# Patient Record
Sex: Male | Born: 1940 | Race: Black or African American | Hispanic: No | Smoking: Former smoker
Health system: Southern US, Community
[De-identification: ages and names within clinical notes are randomized; demographics above are authoritative.]

## PROBLEM LIST (undated history)

## (undated) DIAGNOSIS — E785 Hyperlipidemia, unspecified: Secondary | ICD-10-CM

## (undated) DIAGNOSIS — I10 Essential (primary) hypertension: Secondary | ICD-10-CM

## (undated) DIAGNOSIS — I509 Heart failure, unspecified: Secondary | ICD-10-CM

## (undated) HISTORY — DX: Heart failure, unspecified: I50.9

## (undated) HISTORY — DX: Essential (primary) hypertension: I10

## (undated) HISTORY — DX: Hyperlipidemia, unspecified: E78.5

---

## 2001-03-19 ENCOUNTER — Emergency Department (HOSPITAL_COMMUNITY): Admission: EM | Admit: 2001-03-19 | Discharge: 2001-03-19 | Payer: Self-pay | Admitting: Emergency Medicine

## 2002-10-29 ENCOUNTER — Emergency Department (HOSPITAL_COMMUNITY): Admission: EM | Admit: 2002-10-29 | Discharge: 2002-10-29 | Payer: Self-pay | Admitting: Emergency Medicine

## 2004-06-09 ENCOUNTER — Emergency Department (HOSPITAL_COMMUNITY): Admission: EM | Admit: 2004-06-09 | Discharge: 2004-06-09 | Payer: Self-pay | Admitting: Emergency Medicine

## 2007-10-04 ENCOUNTER — Emergency Department (HOSPITAL_COMMUNITY): Admission: EM | Admit: 2007-10-04 | Discharge: 2007-10-04 | Payer: Self-pay | Admitting: Family Medicine

## 2007-12-28 ENCOUNTER — Emergency Department (HOSPITAL_COMMUNITY): Admission: EM | Admit: 2007-12-28 | Discharge: 2007-12-28 | Payer: Self-pay | Admitting: Emergency Medicine

## 2009-12-22 HISTORY — PX: CARDIAC CATHETERIZATION: SHX172

## 2010-07-11 ENCOUNTER — Ambulatory Visit: Payer: Self-pay | Admitting: Cardiology

## 2010-07-11 ENCOUNTER — Inpatient Hospital Stay (HOSPITAL_COMMUNITY): Admission: EM | Admit: 2010-07-11 | Discharge: 2010-07-16 | Payer: Self-pay | Admitting: Emergency Medicine

## 2010-07-11 ENCOUNTER — Emergency Department (HOSPITAL_COMMUNITY): Admission: EM | Admit: 2010-07-11 | Discharge: 2010-07-11 | Payer: Self-pay | Admitting: Emergency Medicine

## 2010-07-12 ENCOUNTER — Encounter (INDEPENDENT_AMBULATORY_CARE_PROVIDER_SITE_OTHER): Payer: Self-pay | Admitting: Internal Medicine

## 2010-08-05 ENCOUNTER — Ambulatory Visit: Payer: Self-pay | Admitting: Cardiovascular Disease

## 2010-11-06 ENCOUNTER — Ambulatory Visit: Payer: Self-pay | Admitting: Cardiovascular Disease

## 2011-03-08 LAB — POCT URINALYSIS DIP (DEVICE)
Nitrite: NEGATIVE
Specific Gravity, Urine: 1.015 (ref 1.005–1.030)
Urobilinogen, UA: 1 mg/dL (ref 0.0–1.0)

## 2011-03-08 LAB — CARDIAC PANEL(CRET KIN+CKTOT+MB+TROPI)
CK, MB: 2.2 ng/mL (ref 0.3–4.0)
Relative Index: 1.6 (ref 0.0–2.5)
Total CK: 137 U/L (ref 7–232)
Total CK: 156 U/L (ref 7–232)
Troponin I: 0.02 ng/mL (ref 0.00–0.06)

## 2011-03-08 LAB — BASIC METABOLIC PANEL
BUN: 15 mg/dL (ref 6–23)
BUN: 15 mg/dL (ref 6–23)
BUN: 17 mg/dL (ref 6–23)
CO2: 33 mEq/L — ABNORMAL HIGH (ref 19–32)
CO2: 35 mEq/L — ABNORMAL HIGH (ref 19–32)
Calcium: 10.1 mg/dL (ref 8.4–10.5)
Chloride: 94 mEq/L — ABNORMAL LOW (ref 96–112)
Creatinine, Ser: 0.99 mg/dL (ref 0.4–1.5)
GFR calc Af Amer: >60 mL/min (ref >60)
GFR calc Af Amer: >60 mL/min (ref >60)
GFR calc non Af Amer: >60 mL/min (ref >60)
GFR calc non Af Amer: >60 mL/min (ref >60)
GFR calc non Af Amer: >60 mL/min (ref >60)
Glucose, Bld: 100 mg/dL — ABNORMAL HIGH (ref 70–99)
Glucose, Bld: 93 mg/dL (ref 70–99)
Potassium: 3.9 mEq/L (ref 3.5–5.1)
Potassium: 3.9 mEq/L (ref 3.5–5.1)
Sodium: 135 mEq/L (ref 135–145)
Sodium: 141 mEq/L (ref 135–145)

## 2011-03-08 LAB — LIPID PANEL
Cholesterol: 173 mg/dL (ref 0–200)
HDL: 50 mg/dL (ref >39)
VLDL: 13 mg/dL (ref 0–40)

## 2011-03-08 LAB — CBC
HCT: 42.6 % (ref 39.0–52.0)
MCH: 31.7 pg (ref 26.0–34.0)
MCH: 31.9 pg (ref 26.0–34.0)
MCH: 31.9 pg (ref 26.0–34.0)
MCHC: 33.6 g/dL (ref 30.0–36.0)
MCHC: 33.8 g/dL (ref 30.0–36.0)
MCV: 93.9 fL (ref 78.0–100.0)
MCV: 94.2 fL (ref 78.0–100.0)
MCV: 94.4 fL (ref 78.0–100.0)
Platelets: 159 10*3/uL (ref 150–400)
Platelets: 176 10*3/uL (ref 150–400)
RBC: 4.51 MIL/uL (ref 4.22–5.81)
RDW: 12.8 % (ref 11.5–15.5)
RDW: 13.3 % (ref 11.5–15.5)
RDW: 13.4 % (ref 11.5–15.5)
RDW: 13.5 % (ref 11.5–15.5)
WBC: 3.9 10*3/uL — ABNORMAL LOW (ref 4.0–10.5)
WBC: 4.7 10*3/uL (ref 4.0–10.5)
WBC: 5.3 10*3/uL (ref 4.0–10.5)

## 2011-03-08 LAB — COMPREHENSIVE METABOLIC PANEL
ALT: 19 U/L (ref 0–53)
AST: 22 U/L (ref 0–37)
Albumin: 4 g/dL (ref 3.5–5.2)
Alkaline Phosphatase: 41 U/L (ref 39–117)
CO2: 30 mEq/L (ref 19–32)
Calcium: 9.5 mg/dL (ref 8.4–10.5)
Creatinine, Ser: 0.98 mg/dL (ref 0.4–1.5)
Glucose, Bld: 100 mg/dL — ABNORMAL HIGH (ref 70–99)
Total Protein: 7.3 g/dL (ref 6.0–8.3)

## 2011-03-08 LAB — PROTIME-INR
INR: 1.02 (ref 0.00–1.49)
Prothrombin Time: 13.3 seconds (ref 11.6–15.2)

## 2011-03-08 LAB — URINALYSIS, ROUTINE W REFLEX MICROSCOPIC
Bilirubin Urine: NEGATIVE
Nitrite: NEGATIVE
Specific Gravity, Urine: 1.015 (ref 1.005–1.030)
Urobilinogen, UA: 1 mg/dL (ref 0.0–1.0)

## 2011-03-08 LAB — POCT I-STAT, CHEM 8: Creatinine, Ser: 1 mg/dL (ref 0.4–1.5)

## 2011-03-08 LAB — CK TOTAL AND CKMB (NOT AT ARMC)
Relative Index: 1.9 (ref 0.0–2.5)
Total CK: 151 U/L (ref 7–232)
Total CK: 195 U/L (ref 7–232)

## 2011-03-08 LAB — GLUCOSE, CAPILLARY
Glucose-Capillary: 108 mg/dL — ABNORMAL HIGH (ref 70–99)
Glucose-Capillary: 110 mg/dL — ABNORMAL HIGH (ref 70–99)

## 2011-03-08 LAB — BRAIN NATRIURETIC PEPTIDE: Pro B Natriuretic peptide (BNP): 1228 pg/mL — ABNORMAL HIGH (ref 0.0–100.0)

## 2011-03-08 LAB — TSH: TSH: 1.686 u[IU]/mL (ref 0.350–4.500)

## 2011-10-04 ENCOUNTER — Other Ambulatory Visit: Payer: Self-pay | Admitting: Cardiovascular Disease

## 2011-11-05 ENCOUNTER — Other Ambulatory Visit: Payer: Self-pay | Admitting: Cardiovascular Disease

## 2011-11-05 NOTE — Telephone Encounter (Signed)
Pt needs appointment then refill can be made, Fax Received. Refill Completed. Trevor Rodriguez (M.A)

## 2011-11-14 ENCOUNTER — Other Ambulatory Visit: Payer: Self-pay | Admitting: Cardiovascular Disease

## 2012-01-30 ENCOUNTER — Other Ambulatory Visit (HOSPITAL_COMMUNITY): Payer: Self-pay | Admitting: Gastroenterology

## 2012-01-30 DIAGNOSIS — Z139 Encounter for screening, unspecified: Secondary | ICD-10-CM

## 2012-02-16 ENCOUNTER — Ambulatory Visit (HOSPITAL_COMMUNITY)
Admission: RE | Admit: 2012-02-16 | Discharge: 2012-02-16 | Disposition: A | Discharge status: Home / Self Care | Payer: Medicare HMO | Source: Ambulatory Visit | Attending: Gastroenterology | Admitting: Gastroenterology

## 2012-02-16 DIAGNOSIS — Q438 Other specified congenital malformations of intestine: Secondary | ICD-10-CM | POA: Insufficient documentation

## 2012-02-16 DIAGNOSIS — Z139 Encounter for screening, unspecified: Secondary | ICD-10-CM

## 2012-06-04 ENCOUNTER — Encounter: Payer: Self-pay | Admitting: Cardiology

## 2013-02-15 ENCOUNTER — Emergency Department: Payer: Self-pay | Admitting: Emergency Medicine

## 2013-02-16 LAB — COMPREHENSIVE METABOLIC PANEL
Albumin: 2.8 g/dL — ABNORMAL LOW (ref 3.4–5.0)
Alkaline Phosphatase: 71 U/L (ref 50–136)
BUN: 17 mg/dL (ref 7–18)
Calcium, Total: 8.5 mg/dL (ref 8.5–10.1)
Co2: 23 mmol/L (ref 21–32)
EGFR (African American): >60
SGOT(AST): 23 U/L (ref 15–37)
SGPT (ALT): 22 U/L (ref 12–78)
Sodium: 136 mmol/L (ref 136–145)

## 2013-02-16 LAB — CBC
HCT: 34.3 % — ABNORMAL LOW (ref 40.0–52.0)
HGB: 11.4 g/dL — ABNORMAL LOW (ref 13.0–18.0)
MCV: 91 fL (ref 80–100)
Platelet: 165 10*3/uL (ref 150–440)

## 2013-02-20 ENCOUNTER — Emergency Department (HOSPITAL_COMMUNITY): Payer: Medicare HMO | Admitting: Anesthesiology

## 2013-02-20 ENCOUNTER — Encounter (HOSPITAL_COMMUNITY): Payer: Self-pay | Admitting: Anesthesiology

## 2013-02-20 ENCOUNTER — Emergency Department (HOSPITAL_COMMUNITY): Payer: Medicare HMO

## 2013-02-20 ENCOUNTER — Inpatient Hospital Stay (HOSPITAL_COMMUNITY)
Admission: EM | Admit: 2013-02-20 | Discharge: 2013-03-02 | DRG: 717 | Disposition: A | Discharge status: Home Health | Payer: Medicare HMO | Source: Ambulatory Visit | Attending: General Surgery | Admitting: General Surgery

## 2013-02-20 ENCOUNTER — Encounter (HOSPITAL_COMMUNITY): Admission: EM | Disposition: A | Discharge status: Home Health | Payer: Self-pay | Source: Ambulatory Visit

## 2013-02-20 ENCOUNTER — Encounter (HOSPITAL_COMMUNITY): Payer: Self-pay | Admitting: Emergency Medicine

## 2013-02-20 DIAGNOSIS — N493 Fournier gangrene: Secondary | ICD-10-CM | POA: Diagnosis present

## 2013-02-20 DIAGNOSIS — R739 Hyperglycemia, unspecified: Secondary | ICD-10-CM

## 2013-02-20 DIAGNOSIS — I509 Heart failure, unspecified: Secondary | ICD-10-CM | POA: Diagnosis present

## 2013-02-20 DIAGNOSIS — I10 Essential (primary) hypertension: Secondary | ICD-10-CM

## 2013-02-20 DIAGNOSIS — N400 Enlarged prostate without lower urinary tract symptoms: Secondary | ICD-10-CM | POA: Diagnosis present

## 2013-02-20 DIAGNOSIS — E119 Type 2 diabetes mellitus without complications: Secondary | ICD-10-CM | POA: Diagnosis present

## 2013-02-20 DIAGNOSIS — N501 Vascular disorders of male genital organs: Principal | ICD-10-CM | POA: Diagnosis present

## 2013-02-20 DIAGNOSIS — Z886 Allergy status to analgesic agent status: Secondary | ICD-10-CM

## 2013-02-20 DIAGNOSIS — I5022 Chronic systolic (congestive) heart failure: Secondary | ICD-10-CM | POA: Diagnosis present

## 2013-02-20 DIAGNOSIS — F17200 Nicotine dependence, unspecified, uncomplicated: Secondary | ICD-10-CM | POA: Diagnosis present

## 2013-02-20 DIAGNOSIS — E785 Hyperlipidemia, unspecified: Secondary | ICD-10-CM | POA: Diagnosis present

## 2013-02-20 DIAGNOSIS — I43 Cardiomyopathy in diseases classified elsewhere: Secondary | ICD-10-CM | POA: Diagnosis present

## 2013-02-20 DIAGNOSIS — I110 Hypertensive heart disease with heart failure: Secondary | ICD-10-CM | POA: Diagnosis present

## 2013-02-20 DIAGNOSIS — Z7982 Long term (current) use of aspirin: Secondary | ICD-10-CM

## 2013-02-20 DIAGNOSIS — Z79899 Other long term (current) drug therapy: Secondary | ICD-10-CM

## 2013-02-20 DIAGNOSIS — Z6841 Body Mass Index (BMI) 40.0 and over, adult: Secondary | ICD-10-CM

## 2013-02-20 HISTORY — PX: INCISION AND DRAINAGE ABSCESS: SHX5864

## 2013-02-20 LAB — BASIC METABOLIC PANEL
GFR calc Af Amer: 59 mL/min — ABNORMAL LOW (ref >90)
GFR calc non Af Amer: 51 mL/min — ABNORMAL LOW (ref >90)
Glucose, Bld: 203 mg/dL — ABNORMAL HIGH (ref 70–99)
Potassium: 3.3 mEq/L — ABNORMAL LOW (ref 3.5–5.1)
Sodium: 127 mEq/L — ABNORMAL LOW (ref 135–145)

## 2013-02-20 LAB — CBC WITH DIFFERENTIAL/PLATELET
Basophils Relative: 0 % (ref 0–1)
Eosinophils Absolute: 0 10*3/uL (ref 0.0–0.7)
Hemoglobin: 11.2 g/dL — ABNORMAL LOW (ref 13.0–17.0)
Lymphocytes Relative: 9 % — ABNORMAL LOW (ref 12–46)
MCHC: 35.6 g/dL (ref 30.0–36.0)
Neutrophils Relative %: 83 % — ABNORMAL HIGH (ref 43–77)
Platelets: 244 10*3/uL (ref 150–400)
RBC: 3.69 MIL/uL — ABNORMAL LOW (ref 4.22–5.81)

## 2013-02-20 LAB — TROPONIN I: Troponin I: <0.3 ng/mL (ref <0.30)

## 2013-02-20 LAB — CBC
HCT: 32.9 % — ABNORMAL LOW (ref 39.0–52.0)
Hemoglobin: 11.5 g/dL — ABNORMAL LOW (ref 13.0–17.0)
WBC: 17.4 10*3/uL — ABNORMAL HIGH (ref 4.0–10.5)

## 2013-02-20 LAB — CREATININE, SERUM
GFR calc Af Amer: 51 mL/min — ABNORMAL LOW (ref >90)
GFR calc non Af Amer: 44 mL/min — ABNORMAL LOW (ref >90)

## 2013-02-20 LAB — PROTIME-INR
INR: 1.15 (ref 0.00–1.49)
Prothrombin Time: 14.5 seconds (ref 11.6–15.2)

## 2013-02-20 LAB — MRSA PCR SCREENING: MRSA by PCR: NEGATIVE

## 2013-02-20 SURGERY — INCISION AND DRAINAGE, ABSCESS
Anesthesia: General | Site: Groin | Wound class: Dirty or Infected

## 2013-02-20 MED ORDER — SIMVASTATIN 40 MG PO TABS: 40.0000 mg | ORAL_TABLET | Freq: Every day | ORAL | Status: DC

## 2013-02-20 MED ORDER — SUCCINYLCHOLINE CHLORIDE 20 MG/ML IJ SOLN
INTRAMUSCULAR | Status: DC | PRN
  Administered 2013-02-20: 100 mg via INTRAVENOUS

## 2013-02-20 MED ORDER — PIPERACILLIN-TAZOBACTAM 3.375 G IVPB
3.3750 g | Freq: Three times a day (TID) | INTRAVENOUS | Status: DC
  Administered 2013-02-20 – 2013-02-28 (×24): 3.375 g via INTRAVENOUS
  Filled 2013-02-20 (×28): qty 50

## 2013-02-20 MED ORDER — HYDRALAZINE HCL 20 MG/ML IJ SOLN
10.0000 mg | Freq: Four times a day (QID) | INTRAMUSCULAR | Status: DC | PRN
  Filled 2013-02-20 (×2): qty 0.5

## 2013-02-20 MED ORDER — GLYCOPYRROLATE 0.2 MG/ML IJ SOLN
INTRAMUSCULAR | Status: DC | PRN
  Administered 2013-02-20: 0.4 mg via INTRAVENOUS

## 2013-02-20 MED ORDER — NEOSTIGMINE METHYLSULFATE 1 MG/ML IJ SOLN
INTRAMUSCULAR | Status: DC | PRN
  Administered 2013-02-20: 2 mg via INTRAVENOUS

## 2013-02-20 MED ORDER — LIDOCAINE HCL 4 % MT SOLN
OROMUCOSAL | Status: DC | PRN
  Administered 2013-02-20: 4 mL via TOPICAL

## 2013-02-20 MED ORDER — ONDANSETRON HCL 4 MG/2ML IJ SOLN: 4.0000 mg | Freq: Once | INTRAMUSCULAR | Status: DC | PRN

## 2013-02-20 MED ORDER — ENOXAPARIN SODIUM 30 MG/0.3ML ~~LOC~~ SOLN
40.0000 mg | SUBCUTANEOUS | Status: DC
  Administered 2013-02-21 – 2013-03-02 (×10): 40 mg via SUBCUTANEOUS
  Filled 2013-02-20 (×13): qty 0.4

## 2013-02-20 MED ORDER — POTASSIUM CHLORIDE IN NACL 20-0.45 MEQ/L-% IV SOLN
INTRAVENOUS | Status: DC
  Administered 2013-02-20 – 2013-02-21 (×2): via INTRAVENOUS
  Administered 2013-02-23: 1000 mL via INTRAVENOUS
  Administered 2013-02-23: 17:00:00 via INTRAVENOUS
  Administered 2013-02-26: 10 mL/h via INTRAVENOUS
  Filled 2013-02-20 (×8): qty 1000

## 2013-02-20 MED ORDER — SODIUM CHLORIDE 0.9 % IR SOLN
Status: DC | PRN
  Administered 2013-02-20: 3000 mL

## 2013-02-20 MED ORDER — LIDOCAINE HCL (CARDIAC) 20 MG/ML IV SOLN
INTRAVENOUS | Status: DC | PRN
  Administered 2013-02-20: 100 mg via INTRAVENOUS

## 2013-02-20 MED ORDER — SIMVASTATIN 40 MG PO TABS
40.0000 mg | ORAL_TABLET | Freq: Every day | ORAL | Status: DC
  Administered 2013-02-20 – 2013-03-01 (×10): 40 mg via ORAL
  Filled 2013-02-20 (×12): qty 1

## 2013-02-20 MED ORDER — INSULIN ASPART 100 UNIT/ML ~~LOC~~ SOLN: 0.0000 [IU] | Freq: Every day | SUBCUTANEOUS | Status: DC

## 2013-02-20 MED ORDER — ONDANSETRON HCL 4 MG/2ML IJ SOLN
4.0000 mg | Freq: Four times a day (QID) | INTRAMUSCULAR | Status: DC | PRN
  Filled 2013-02-20: qty 2

## 2013-02-20 MED ORDER — LACTATED RINGERS IV SOLN
INTRAVENOUS | Status: DC | PRN
  Administered 2013-02-20: 08:00:00 via INTRAVENOUS

## 2013-02-20 MED ORDER — ONDANSETRON HCL 4 MG/2ML IJ SOLN
INTRAMUSCULAR | Status: DC | PRN
  Administered 2013-02-20: 4 mg via INTRAVENOUS

## 2013-02-20 MED ORDER — INSULIN ASPART 100 UNIT/ML ~~LOC~~ SOLN
0.0000 [IU] | Freq: Three times a day (TID) | SUBCUTANEOUS | Status: DC
  Administered 2013-02-21 – 2013-02-22 (×2): 2 [IU] via SUBCUTANEOUS
  Administered 2013-02-22: 3 [IU] via SUBCUTANEOUS
  Administered 2013-02-23 – 2013-02-27 (×7): 2 [IU] via SUBCUTANEOUS

## 2013-02-20 MED ORDER — VECURONIUM BROMIDE 10 MG IV SOLR
INTRAVENOUS | Status: DC | PRN
  Administered 2013-02-20: 2 mg via INTRAVENOUS

## 2013-02-20 MED ORDER — PROPOFOL 10 MG/ML IV BOLUS
INTRAVENOUS | Status: DC | PRN
  Administered 2013-02-20: 100 mg via INTRAVENOUS

## 2013-02-20 MED ORDER — HYDROMORPHONE HCL PF 1 MG/ML IJ SOLN
1.0000 mg | INTRAMUSCULAR | Status: DC | PRN
  Administered 2013-02-21: 1 mg via INTRAVENOUS
  Filled 2013-02-20: qty 1

## 2013-02-20 MED ORDER — PHENYLEPHRINE HCL 10 MG/ML IJ SOLN
INTRAMUSCULAR | Status: DC | PRN
  Administered 2013-02-20: 80 ug via INTRAVENOUS
  Administered 2013-02-20 (×2): 120 ug via INTRAVENOUS
  Administered 2013-02-20: 80 ug via INTRAVENOUS

## 2013-02-20 MED ORDER — SODIUM CHLORIDE 0.9 % IV BOLUS (SEPSIS)
500.0000 mL | Freq: Once | INTRAVENOUS | Status: AC
  Administered 2013-02-20: 500 mL via INTRAVENOUS

## 2013-02-20 MED ORDER — CARVEDILOL 6.25 MG PO TABS: 6.2500 mg | ORAL_TABLET | Freq: Two times a day (BID) | ORAL | Status: DC

## 2013-02-20 MED ORDER — VANCOMYCIN HCL IN DEXTROSE 1-5 GM/200ML-% IV SOLN
1000.0000 mg | INTRAVENOUS | Status: DC
  Administered 2013-02-20 – 2013-02-21 (×2): 1000 mg via INTRAVENOUS
  Filled 2013-02-20 (×2): qty 200

## 2013-02-20 MED ORDER — PIPERACILLIN-TAZOBACTAM 3.375 G IVPB 30 MIN
3.3750 g | Freq: Once | INTRAVENOUS | Status: AC
  Administered 2013-02-20: 3.375 g via INTRAVENOUS
  Filled 2013-02-20: qty 50

## 2013-02-20 MED ORDER — TAMSULOSIN HCL 0.4 MG PO CAPS: 0.4000 mg | ORAL_CAPSULE | Freq: Every day | ORAL | Status: DC

## 2013-02-20 MED ORDER — ONDANSETRON HCL 4 MG PO TABS
4.0000 mg | ORAL_TABLET | Freq: Four times a day (QID) | ORAL | Status: DC | PRN
  Administered 2013-03-01: 4 mg via ORAL
  Filled 2013-02-20 (×2): qty 1

## 2013-02-20 MED ORDER — EPHEDRINE SULFATE 50 MG/ML IJ SOLN
INTRAMUSCULAR | Status: DC | PRN
  Administered 2013-02-20 (×3): 10 mg via INTRAVENOUS

## 2013-02-20 MED ORDER — OXYCODONE-ACETAMINOPHEN 5-325 MG PO TABS
1.0000 | ORAL_TABLET | ORAL | Status: DC | PRN
  Administered 2013-02-22 (×2): 1 via ORAL
  Administered 2013-02-22: 2 via ORAL
  Administered 2013-02-22 – 2013-02-23 (×3): 1 via ORAL
  Administered 2013-02-23 – 2013-03-01 (×6): 2 via ORAL
  Administered 2013-03-02: 1 via ORAL
  Filled 2013-02-20 (×3): qty 1
  Filled 2013-02-20 (×2): qty 2
  Filled 2013-02-20: qty 1
  Filled 2013-02-20 (×2): qty 2
  Filled 2013-02-20 (×2): qty 1
  Filled 2013-02-20 (×4): qty 2

## 2013-02-20 MED ORDER — CARVEDILOL 6.25 MG PO TABS
6.2500 mg | ORAL_TABLET | Freq: Two times a day (BID) | ORAL | Status: DC
  Administered 2013-02-20 – 2013-03-02 (×18): 6.25 mg via ORAL
  Filled 2013-02-20 (×24): qty 1

## 2013-02-20 MED ORDER — VANCOMYCIN HCL IN DEXTROSE 1-5 GM/200ML-% IV SOLN
1000.0000 mg | Freq: Once | INTRAVENOUS | Status: AC
  Administered 2013-02-20: 1000 mg via INTRAVENOUS
  Filled 2013-02-20: qty 200

## 2013-02-20 MED ORDER — FUROSEMIDE 20 MG PO TABS
20.0000 mg | ORAL_TABLET | Freq: Every day | ORAL | Status: DC
  Administered 2013-02-21: 20 mg via ORAL
  Filled 2013-02-20: qty 1

## 2013-02-20 MED ORDER — TAMSULOSIN HCL 0.4 MG PO CAPS
0.4000 mg | ORAL_CAPSULE | Freq: Every day | ORAL | Status: DC
  Administered 2013-02-20 – 2013-03-01 (×10): 0.4 mg via ORAL
  Filled 2013-02-20 (×12): qty 1

## 2013-02-20 MED ORDER — IOHEXOL 300 MG/ML  SOLN
80.0000 mL | Freq: Once | INTRAMUSCULAR | Status: AC | PRN
  Administered 2013-02-20: 80 mL via INTRAVENOUS

## 2013-02-20 MED ORDER — HYDROMORPHONE HCL PF 1 MG/ML IJ SOLN: 0.2500 mg | INTRAMUSCULAR | Status: DC | PRN

## 2013-02-20 MED ORDER — INSULIN ASPART 100 UNIT/ML ~~LOC~~ SOLN
4.0000 [IU] | Freq: Three times a day (TID) | SUBCUTANEOUS | Status: DC
  Administered 2013-02-21 – 2013-03-02 (×19): 4 [IU] via SUBCUTANEOUS

## 2013-02-20 MED ORDER — INSULIN ASPART 100 UNIT/ML ~~LOC~~ SOLN: 0.0000 [IU] | Freq: Three times a day (TID) | SUBCUTANEOUS | Status: DC

## 2013-02-20 MED ORDER — FENTANYL CITRATE 0.05 MG/ML IJ SOLN
INTRAMUSCULAR | Status: DC | PRN
  Administered 2013-02-20: 100 ug via INTRAVENOUS
  Administered 2013-02-20: 50 ug via INTRAVENOUS

## 2013-02-20 MED ORDER — HYDROMORPHONE HCL PF 1 MG/ML IJ SOLN
INTRAMUSCULAR | Status: AC
  Filled 2013-02-20: qty 1

## 2013-02-20 SURGICAL SUPPLY — 36 items
BANDAGE GAUZE ELAST BULKY 4 IN (GAUZE/BANDAGES/DRESSINGS) ×1 IMPLANT
BLADE SURG 15 STRL LF DISP TIS (BLADE) ×1 IMPLANT
BLADE SURG 15 STRL SS (BLADE) ×2
BLADE SURG ROTATE 9660 (MISCELLANEOUS) ×1 IMPLANT
CANISTER SUCTION 2500CC (MISCELLANEOUS) ×2 IMPLANT
CLOTH BEACON ORANGE TIMEOUT ST (SAFETY) ×2 IMPLANT
COVER MAYO STAND STRL (DRAPES) ×1 IMPLANT
COVER SURGICAL LIGHT HANDLE (MISCELLANEOUS) ×2 IMPLANT
DRAIN PENROSE 1/2X12 LTX STRL (WOUND CARE) ×1 IMPLANT
DRAIN PENROSE 1X12 LTX STRL (DRAIN) ×1 IMPLANT
DRAPE LAPAROSCOPIC ABDOMINAL (DRAPES) ×1 IMPLANT
DRSG PAD ABDOMINAL 8X10 ST (GAUZE/BANDAGES/DRESSINGS) ×2 IMPLANT
ELECT REM PT RETURN 9FT ADLT (ELECTROSURGICAL) ×2
ELECTRODE REM PT RTRN 9FT ADLT (ELECTROSURGICAL) ×1 IMPLANT
GAUZE SPONGE 4X4 16PLY XRAY LF (GAUZE/BANDAGES/DRESSINGS) ×2 IMPLANT
GLOVE BIO SURGEON STRL SZ7 (GLOVE) ×2 IMPLANT
GLOVE BIOGEL PI IND STRL 7.5 (GLOVE) ×1 IMPLANT
GLOVE BIOGEL PI INDICATOR 7.5 (GLOVE) ×1
GOWN STRL NON-REIN LRG LVL3 (GOWN DISPOSABLE) ×4 IMPLANT
HANDPIECE INTERPULSE COAX TIP (DISPOSABLE) ×2
KIT BASIN OR (CUSTOM PROCEDURE TRAY) ×2 IMPLANT
KIT ROOM TURNOVER OR (KITS) ×2 IMPLANT
NS IRRIG 1000ML POUR BTL (IV SOLUTION) ×2 IMPLANT
PACK LITHOTOMY IV (CUSTOM PROCEDURE TRAY) ×2 IMPLANT
PAD ARMBOARD 7.5X6 YLW CONV (MISCELLANEOUS) ×4 IMPLANT
SET HNDPC FAN SPRY TIP SCT (DISPOSABLE) IMPLANT
SPONGE GAUZE 4X4 12PLY (GAUZE/BANDAGES/DRESSINGS) ×2 IMPLANT
SPONGE LAP 18X18 X RAY DECT (DISPOSABLE) ×1 IMPLANT
SUT ETHILON 2 0 FS 18 (SUTURE) ×2 IMPLANT
TAPE CLOTH SURG 4X10 WHT LF (GAUZE/BANDAGES/DRESSINGS) ×1 IMPLANT
TOWEL OR 17X24 6PK STRL BLUE (TOWEL DISPOSABLE) ×2 IMPLANT
TOWEL OR 17X26 10 PK STRL BLUE (TOWEL DISPOSABLE) ×2 IMPLANT
TRAY FOLEY CATH 14FR (SET/KITS/TRAYS/PACK) ×1 IMPLANT
TUBE CONNECTING 12X1/4 (SUCTIONS) ×2 IMPLANT
WATER STERILE IRR 1000ML POUR (IV SOLUTION) ×2 IMPLANT
YANKAUER SUCT BULB TIP NO VENT (SUCTIONS) ×2 IMPLANT

## 2013-02-20 NOTE — H&P (Signed)
Trevor Rodriguez is an 72 y.o. male.   Chief Complaint: Painful swelling in suprapubic region HPI: This is a 72 yo male who presents with one week of worsening swelling and erythema in his suprapubic region.  This apparently was drained in the office by his primary care physician, but the swelling and pain has worsened and extends down into his scrotum and thigh.  He is now having some nausea and subjective fever.  PCP - Dr. Corine Shelter  Past Medical History  Diagnosis Date  . Congestive heart failure     Ejection fraction around 15%. He was recently horpitalized for heart failure and had a heart catheterization. He had minor luminal irregularities.  . Hypertension   . Hyperlipidemia     Past Surgical History  Procedure Laterality Date  . Cardiac catheterization  2011    No family history on file. Social History:  reports that he has been smoking Cigarettes.  He has been smoking about 0.50 packs per day. He does not have any smokeless tobacco history on file. His alcohol and drug histories are not on file.  Allergies:  Allergies  Allergen Reactions  . Ibuprofen Nausea Only    Meds; Current Outpatient Rx  Name  Route  Sig  Dispense  Refill  . aspirin 325 MG tablet  Oral  Take 325 mg by mouth daily.  . carvedilol (COREG) 6.25 MG tablet  TAKE ONE TABLET BY MOUTH TWICE DAILY WITH MEALS  60 tablet  0  NEEDS TO SCHEDULE AN OFFICE VISIT  . furosemide (LASIX) 20 MG tablet  TAKE ONE TABLET BY MOUTH EVERY DAY  30 tablet  0  NEEDS TO SCHEDULE AN OFFICE VISIT  . simvastatin (ZOCOR) 40 MG tablet  TAKE ONE TABLET BY MOUTH EVERY DAY  15 tablet  0  Pt needs appointment then refill can be made  . Tamsulosin HCl (FLOMAX) 0.4 MG CAPS  TAKE ONE CAPSULE BY MOUTH EVERY DAY  30 capsule  0    Results for orders placed during the hospital encounter of 02/20/13 (from the past 48 hour(s))  CBC WITH DIFFERENTIAL     Status: Abnormal   Collection Time    02/20/13  1:50 AM   Result Value Range   WBC 14.7 (*) 4.0 - 10.5 K/uL   RBC 3.69 (*) 4.22 - 5.81 MIL/uL   Hemoglobin 11.2 (*) 13.0 - 17.0 g/dL   HCT 45.4 (*) 09.8 - 11.9 %   MCV 85.4  78.0 - 100.0 fL   MCH 30.4  26.0 - 34.0 pg   MCHC 35.6  30.0 - 36.0 g/dL   RDW 14.7  82.9 - 56.2 %   Platelets 244  150 - 400 K/uL   Neutrophils Relative 83 (*) 43 - 77 %   Lymphocytes Relative 9 (*) 12 - 46 %   Monocytes Relative 8  3 - 12 %   Eosinophils Relative 0  0 - 5 %   Basophils Relative 0  0 - 1 %   Neutro Abs 12.2 (*) 1.7 - 7.7 K/uL   Lymphs Abs 1.3  0.7 - 4.0 K/uL   Monocytes Absolute 1.2 (*) 0.1 - 1.0 K/uL   Eosinophils Absolute 0.0  0.0 - 0.7 K/uL   Basophils Absolute 0.0  0.0 - 0.1 K/uL   WBC Morphology MILD LEFT SHIFT (1-5% METAS, OCC MYELO, OCC BANDS)     Comment: ATYPICAL LYMPHOCYTES     TOXIC GRANULATION     VACUOLATED NEUTROPHILS  BASIC METABOLIC PANEL  Status: Abnormal   Collection Time    02/20/13  1:50 AM      Result Value Range   Sodium 127 (*) 135 - 145 mEq/L   Potassium 3.3 (*) 3.5 - 5.1 mEq/L   Chloride 93 (*) 96 - 112 mEq/L   CO2 23  19 - 32 mEq/L   Glucose, Bld 203 (*) 70 - 99 mg/dL   BUN 28 (*) 6 - 23 mg/dL   Creatinine, Ser 0.98  0.50 - 1.35 mg/dL   Calcium 8.8  8.4 - 11.9 mg/dL   GFR calc non Af Amer 51 (*) >90 mL/min   GFR calc Af Amer 59 (*) >90 mL/min   Comment:            The eGFR has been calculated     using the CKD EPI equation.     This calculation has not been     validated in all clinical     situations.     eGFR's persistently     <90 mL/min signify     possible Chronic Kidney Disease.   Ct Abdomen Pelvis W Contrast  02/20/2013  *RADIOLOGY REPORT*  Clinical Data: Pelvic area induration, erythema, and drainage.  CT ABDOMEN AND PELVIS WITH CONTRAST  Technique:  Multidetector CT imaging of the abdomen and pelvis was performed following the standard protocol during bolus administration of intravenous contrast.  Contrast: 80mL OMNIPAQUE IOHEXOL 300 MG/ML  SOLN   Comparison: None.  Findings: Bibasilar atelectasis.  Cardiomegaly.  Coronary artery calcification.  Lobular hepatic contour.  No focal abnormality.  Question layering gallstones or sludge.  No biliary ductal dilatation.  Unremarkable spleen, pancreas, adrenal glands.  There are bilateral cystic renal lesions, measuring water attenuation within the larger lesion on the left.  The other lesions are incompletely characterized.  No hydronephrosis or hydroureter.  No CT evidence for colitis.  Normal appendix.  Small bowel loops are normal course and caliber.  Small hiatal hernia.  No free intraperitoneal air or fluid. Prominent bilateral inguinal lymph nodes. Enlarged right common iliac chain lymph node measuring 12 mm short axis. Presumably, these are reactive.  There is scattered atherosclerotic calcification of the aorta and its branches. No aneurysmal dilatation.  Thin-walled bladder.  There is subcutaneous fat stranding and gas extending along the right inguinal region, perineum, right hemi scrotum, and right medial thigh.  Scrotal wall edema.  Bilateral hydroceles.  Prominent pampiniform plexus.  Diffuse SI joints. Multilevel degenerative changes.  Bridging anterior osteophytes.  IMPRESSION: Subcutaneous fat stranding and gas extends along the right inguinal region, perineum, right hemiscrotum, and right medial thigh, where it abuts the medial musculature.  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  Scrotal wall edema.  Bilateral hydroceles. Epididymoorchitis not excluded.  Incompletely characterized renal lesions.  Consider non emergent ultrasound follow-up.  Critical Value/emergent results were called by telephone at the time of interpretation on 02/20/2013 at 06:50 a.m. to Dr. Ranae Palms, who verbally acknowledged these results.   Original Report Authenticated By: Jearld Lesch, M.D.     Review of Systems  Eyes: Negative.   Gastrointestinal: Positive for nausea, vomiting and abdominal pain.  Neurological:  Negative.   Endo/Heme/Allergies: Negative.   Lower abdominal swelling, tenderness Scrotal swelling   Blood pressure 98/52, pulse 73, temperature 98.9 F (37.2 C), temperature source Oral, resp. rate 20, SpO2 94.00%. 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  The surgical procedure has been discussed with the patient.  Potential risks, benefits, alternative treatments, and expected outcomes have been explained.  All of the patient's questions at this time have been answered.  The likelihood of reaching the patient's treatment goal is good.  The patient understand the proposed surgical procedure and wishes to proceed.   Cherry Wittwer K. 02/20/2013, 7:45 AM

## 2013-02-20 NOTE — Consult Note (Signed)
[facility] Consult NOTE [name], MD               Pager [phone] (if 7P to 7A, page night hospitalist on [email or web address] Rodriguez YNW:[id number] DOB: 10-16-1941 DOA: 02/20/2013 PCP: Eino Farber, MD  Assessment/Plan: 1. S/P wound debridement   - abx and continued wound maintenance per the surgery team   - sounds like it is likely patient will need to return to the OR soon.   - admit to step down bed   Active Problems:   CHF (congestive heart failure)   - TTE 2011 showed EF 10% w/ restrictive physiology   - was on lasix 20mg  daily PO and coreg 6.25mg  BID PO at home. Will continue home coreg and lasix in the setting of well compensated CHF    - repeat TTE to assess current cardiac status   - pt is being gently hydrated postop w/ 50cc/hr of IVF. Ok to continue but watch closely  - monitor daily weights and daily BMP to ensure no significant weight gain or end organ impairment from the CHF   - strict I/Os     Hyperglycemia  - was not medicated at home and no known hx of DM  - glucose on BMP was 200s  - start SSI to monitor need for insulin  - check A1C for diagnosis   - carb modified diet once able to eat     Essential hypertension, benign   - given current scenario could involve further operations, will hold on starting any new chronic medical therapy at this time   - cont home coreg   - hydralazine prn IV and monitor need for further control   - if needs chronic treatment, will see if pt is diabetic to determine what the best medical therapy may be     Other and unspecified hyperlipidemia  - check fasting lipids in AM and cont home statin   BPH   - cont home flomax to ensure does not develop BOO post op  PPx - SCDs     Alysia Penna, MD  Internal Medicine Pager [phone].  If 7PM-7AM, please contact night-coverage at [email or web address], password [email or web address] 02/20/2013, 11:47 AM   LOS: 0 days   Brief narrative: Pt was brought to OR on 02/20/13 for  debridement of lower abdominal wall, suprapubic region, scrotum and R thigh 2/2 worsening infection that failed outpatient drainage by his PCP (Dr Corine Shelter).    He is now post op and will continue to be cared for primarily by Dr Fatima Sanger team, but [facility] will be caring for his other comorbidities.    HPI/Subjective: Patient resting comfortably in PACU waiting on bed. Family at bedside. Poor historian   Objective: Filed Vitals:   02/20/13 0555 02/20/13 0802 02/20/13 0940 02/20/13 1130  BP: 98/52 125/89  110/49  Pulse: 73   71  Temp: 98.9 F (37.2 C) 98.4 F (36.9 C) 98.7 F (37.1 C)   TempSrc: Oral Oral    Resp: 20 17  17   SpO2: 94% 97%  100%    Intake/Output Summary (Last 24 hours) at 02/20/13 1147 Last data filed at 02/20/13 0915  Gross per 24 hour  Intake    800 ml  Output    150 ml  Net    650 ml    Exam:   General:  AAM in NAD   HEENT: MMM, trachea midline, no JVD  Cardiovascular: RRR no MRG, no periph edema   Respiratory:  CTAB, no wheezes or rales   Abdomen: tender   MSK: no focal weakness   Neuro: AAOx3, normal mood   Ext: no skin tenting or edema   Data Reviewed: Basic Metabolic Panel:  Recent Labs Lab 02/20/13 0150  NA 127*  K 3.3*  CL 93*  CO2 23  GLUCOSE 203*  BUN 28*  CREATININE 1.35  CALCIUM 8.8   Liver Function Tests: No results found for this basename: AST, ALT, ALKPHOS, BILITOT, PROT, ALBUMIN,  in the last 168 hours No results found for this basename: LIPASE, AMYLASE,  in the last 168 hours No results found for this basename: AMMONIA,  in the last 168 hours CBC:  Recent Labs Lab 02/20/13 0150 02/20/13 1115  WBC 14.7* 17.4*  NEUTROABS 12.2*  --   HGB 11.2* 11.5*  HCT 31.5* 32.9*  MCV 85.4 85.7  PLT 244 222   Cardiac Enzymes:  Recent Labs Lab 02/20/13 0738  TROPONINI <0.30   BNP: No components found with this basename: POCBNP,  CBG: No results found for this basename: GLUCAP,  in the last 168 hours  No  results found for this or any previous visit (from the past 240 hour(s)).   Studies: Ct Abdomen Pelvis W Contrast  02/20/2013  *RADIOLOGY REPORT*  Clinical Data: Pelvic area induration, erythema, and drainage.  CT ABDOMEN AND PELVIS WITH CONTRAST  Technique:  Multidetector CT imaging of the abdomen and pelvis was performed following the standard protocol during bolus administration of intravenous contrast.  Contrast: 80mL OMNIPAQUE IOHEXOL 300 MG/ML  SOLN  Comparison: None.  Findings: Bibasilar atelectasis.  Cardiomegaly.  Coronary artery calcification.  Lobular hepatic contour.  No focal abnormality.  Question layering gallstones or sludge.  No biliary ductal dilatation.  Unremarkable spleen, pancreas, adrenal glands.  There are bilateral cystic renal lesions, measuring water attenuation within the larger lesion on the left.  The other lesions are incompletely characterized.  No hydronephrosis or hydroureter.  No CT evidence for colitis.  Normal appendix.  Small bowel loops are normal course and caliber.  Small hiatal hernia.  No free intraperitoneal air or fluid. Prominent bilateral inguinal lymph nodes. Enlarged right common iliac chain lymph node measuring 12 mm short axis. Presumably, these are reactive.  There is scattered atherosclerotic calcification of the aorta and its branches. No aneurysmal dilatation.  Thin-walled bladder.  There is subcutaneous fat stranding and gas extending along the right inguinal region, perineum, right hemi scrotum, and right medial thigh.  Scrotal wall edema.  Bilateral hydroceles.  Prominent pampiniform plexus.  Diffuse SI joints. Multilevel degenerative changes.  Bridging anterior osteophytes.  IMPRESSION: Subcutaneous fat stranding and gas extends along the right inguinal region, perineum, right hemiscrotum, and right medial thigh, where it abuts the medial musculature.  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  Scrotal wall edema.  Bilateral hydroceles. Epididymoorchitis not  excluded.  Incompletely characterized renal lesions.  Consider non emergent ultrasound follow-up.  Critical Value/emergent results were called by telephone at the time of interpretation on 02/20/2013 at 06:50 a.m. to Dr. Ranae Palms, who verbally acknowledged these results.   Original Report Authenticated By: Jearld Lesch, M.D.    Dg Chest Port 1 View  02/20/2013  *RADIOLOGY REPORT*  Clinical Data: Preop.  PORTABLE CHEST - 1 VIEW  Comparison: 07/14/2010  Findings: Cardiomegaly with vascular congestion.  Bibasilar atelectasis.  No visible effusions or acute bony abnormality.  IMPRESSION: Cardiomegaly with vascular congestion and bibasilar atelectasis.   Original Report Authenticated By: Charlett Nose, M.D.     Scheduled  Meds: . HYDROmorphone       Continuous Infusions: . 0.45 % NaCl with KCl 20 mEq / L       Time Spent: 45 minutes

## 2013-02-20 NOTE — Progress Notes (Signed)
ANTIBIOTIC CONSULT NOTE - FOLLOW UP  Pharmacy Consult for vanc/zosyn Indication: s/p wound debridement  Allergies  Allergen Reactions  . Ibuprofen Nausea Only    Patient Measurements: Height: 5\' 6"  (167.6 cm) Weight: 252 lb (114.306 kg) IBW/kg (Calculated) : 63.8   Vital Signs: Temp: 99.2 F (37.3 C) (03/02 1400) Temp src: Oral (03/02 1400) BP: 126/51 mmHg (03/02 1400) Pulse Rate: 67 (03/02 1400) Intake/Output from previous day:   Intake/Output from this shift: Total I/O In: 800 [I.V.:800] Out: 450 [Urine:400; Blood:50]  Labs:  Recent Labs  02/20/13 0150 02/20/13 1115  WBC 14.7* 17.4*  HGB 11.2* 11.5*  PLT 244 222  CREATININE 1.35 1.51*   Estimated Creatinine Clearance: 52.5 ml/min (by C-G formula based on Cr of 1.51). No results found for this basename: VANCOTROUGH, VANCOPEAK, VANCORANDOM, GENTTROUGH, GENTPEAK, GENTRANDOM, TOBRATROUGH, TOBRAPEAK, TOBRARND, AMIKACINPEAK, AMIKACINTROU, AMIKACIN,  in the last 72 hours   Microbiology: No results found for this or any previous visit (from the past 720 hour(s)).  Anti-infectives   Start     Dose/Rate Route Frequency Ordered Stop   02/20/13 1500  vancomycin (VANCOCIN) IVPB 1000 mg/200 mL premix     1,000 mg 200 mL/hr over 60 Minutes Intravenous Every 24 hours 02/20/13 1432     02/20/13 1500  piperacillin-tazobactam (ZOSYN) IVPB 3.375 g     3.375 g 12.5 mL/hr over 240 Minutes Intravenous 3 times per day 02/20/13 1432     02/20/13 0700  piperacillin-tazobactam (ZOSYN) IVPB 3.375 g     3.375 g 100 mL/hr over 30 Minutes Intravenous  Once 02/20/13 0649 02/20/13 0754   02/20/13 0515  vancomycin (VANCOCIN) IVPB 1000 mg/200 mL premix     1,000 mg 200 mL/hr over 60 Minutes Intravenous  Once 02/20/13 0508 02/20/13 0658      Assessment: 72 y.om presents w/pelvic area induration, erythema, purulent drainage. Now s/p debridement (3/2) of necrotizing infection of the suprapubic region to be started on Vanc and Zosyn. WBC  17.4 afebrile, cultures sent. Scr 1.51,  Crcl~40 ml/min  Goal of Therapy:  Vancomycin trough level 15-20 mcg/ml  Plan:  1.Initiate vancomycin 1000mg  IV Q24h 2.Initiate zosyn.375 GM Iv q8h 3.Follow up renal function and clinical progress 4. Vanc trough as needed  Bola A. Wandra Feinstein D Clinical Pharmacist Pager:[phone] Phone [phone] 02/20/2013 2:34 PM

## 2013-02-20 NOTE — Anesthesia Preprocedure Evaluation (Addendum)
Anesthesia Evaluation  Patient identified by MRN, date of birth, ID band Patient awake    Reviewed: Allergy & Precautions, H&P , NPO status , Patient's Chart, lab work & pertinent test results  Airway Mallampati: I TM Distance: >3 FB Neck ROM: full    Dental  (+) Teeth Intact and Dental Advidsory Given   Pulmonary Current Smoker,          Cardiovascular hypertension, +CHF Rhythm:regular Rate:Normal     Neuro/Psych    GI/Hepatic   Endo/Other    Renal/GU      Musculoskeletal   Abdominal   Peds  Hematology   Anesthesia Other Findings   Reproductive/Obstetrics                          Anesthesia Physical Anesthesia Plan  ASA: III  Anesthesia Plan: General   Post-op Pain Management:    Induction: Intravenous  Airway Management Planned: Oral ETT  Additional Equipment:   Intra-op Plan:   Post-operative Plan: Extubation in OR and Possible Post-op intubation/ventilation  Informed Consent: I have reviewed the patients History and Physical, chart, labs and discussed the procedure including the risks, benefits and alternatives for the proposed anesthesia with the patient or authorized representative who has indicated his/her understanding and acceptance.   Dental Advisory Given  Plan Discussed with: CRNA, Anesthesiologist and Surgeon  Anesthesia Plan Comments:        Anesthesia Quick Evaluation

## 2013-02-20 NOTE — Op Note (Signed)
Pre-op diagnosis - necrotizing infection of the suprapubic region, right groin, right thigh, and right hemiscrotum Post-op diagnosis - same Procedure - debridement of necrotizing infection of the suprapubic region, right groin, right thigh, and right hemiscrotum Surgeon:  TSUEI,MATTHEW K. Anesthesia:  GETT Indications:  This is a 72 year old male with multiple considerable medical problems who presents with a one-week history of worsening infection in his right groin. Last week he was drained by his primary care physician on his right thigh. This was packed with gauze. The erythema, swelling, and tenderness have spread up across the suprapubic region and into the scrotum. He has had subjective fevers as well as nausea. He presents now to the emergency department for evaluation. He was noted to have an elevated white blood cell count. A CT scan showed widespread infection and gas in the suprapubic region tracking down his right side and into the upper right hemiscrotum.  He is brought emergently to the operating room for debridement of this area.  Description of procedure: The patient was brought to the operating room and placed in a supine position on the operating room table. After an adequate level of general anesthesia was obtained, the patient's legs were placed in lithotomy position in yellowfin stirrups. His lower abdomen penis, scrotum, and upper thighs were all prepped with Betadine and draped in sterile fashion. A timeout was taken to ensure the proper patient proper procedure. The patient has a previous small hole in his upper right thigh just below the inguinal crease. There is some purulent drainage from this area. We open as wire and inserted a finger. This seems to track posteriorly for about 5 cm. This tracks superiorly up to the suprapubic region. I made a counter incision in the suprapubic region and encountered a large amount of purulent fluid and necrotic fat. We extended the suprapubic  incision to excise about 30 cm of skin and subcutaneous fat. This went down to the muscle. Preserved these structures leading to the base of the penis. The infection tracks into the left groin but does not go down until it left hemiscrotum. Once we open all of the affected areas widely we irrigated out the tissues with pulse lavage using 3 L of normal saline. We used cautery for hemostasis. We placed a 1 inch Penrose drain heading up from his right thigh under the skin bridge into the suprapubic wound. This was sutured in place with 2-0 nylon sutures. A half-inch Penrose drain was placed in the left groin in 2 locations. Both to the smaller drains were all secured with 2-0 nylon. We packed saline soaked gauze and all of the wounds. Dry dressings were applied. The patient was then extubated and brought to the recovery room in stable condition. All sponge, instrument, and needle counts are correct.  Wilmon Arms. Corliss Skains, MD, [facility]  02/20/2013 9:39 AM

## 2013-02-20 NOTE — Progress Notes (Signed)
Pt arrived from PACU, VSS, call bell within reach, oriented to unit and routine, will continue to monitor

## 2013-02-20 NOTE — Preoperative (Signed)
Beta Blockers   Reason not to administer Beta Blockers:Not Applicable 

## 2013-02-20 NOTE — Anesthesia Postprocedure Evaluation (Signed)
  Anesthesia Post-op Note  Patient: Trevor Rodriguez  Procedure(s) Performed: Procedure(s): Debridemnt of lower abdominal wall, suprapubic region, scrotum and right thigh (N/A)  Patient Location: PACU  Anesthesia Type:General  Level of Consciousness: awake, oriented and patient cooperative  Airway and Oxygen Therapy: Patient Spontanous Breathing and Patient connected to face mask oxygen  Post-op Pain: moderate  Post-op Assessment: Post-op Vital signs reviewed, Patient's Cardiovascular Status Stable, Respiratory Function Stable, Patent Airway, No signs of Nausea or vomiting and Pain level controlled  Post-op Vital Signs: stable  Complications: No apparent anesthesia complications

## 2013-02-20 NOTE — ED Provider Notes (Addendum)
History     CSN: [id number]  Arrival date & time 02/20/13  0140   First MD Initiated Contact with Patient 02/20/13 0501      Chief Complaint  Patient presents with  . Abscess    (Consider location/radiation/quality/duration/timing/severity/associated sxs/prior treatment) HPI Pt with lower abd wall abscess starting on Monday. Was seen by PMD who drained abscess but swelling, pain, redness has progressed. +nausea and subjective fever and chills. No genital pain. States genitals are normally swollen and this is not acute change.  Past Medical History  Diagnosis Date  . Congestive heart failure     Ejection fraction around 15%. He was recently horpitalized for heart failure and had a heart catheterization. He had minor luminal irregularities.  . Hypertension   . Hyperlipidemia     Past Surgical History  Procedure Laterality Date  . Cardiac catheterization  2011    No family history on file.  History  Substance Use Topics  . Smoking status: Current Every Day Smoker -- 0.50 packs/day    Types: Cigarettes  . Smokeless tobacco: Not on file  . Alcohol Use: Not on file      Review of Systems  Constitutional: Positive for fever, chills and fatigue.  Gastrointestinal: Positive for nausea and abdominal pain. Negative for vomiting and diarrhea.  Skin: Positive for color change and rash.  Neurological: Negative for weakness, light-headedness and numbness.  All other systems reviewed and are negative.    Allergies  Ibuprofen  Home Medications   No current outpatient prescriptions on file.  BP 124/53  Pulse 67  Temp(Src) 98.6 F (37 C) (Oral)  Resp 13  Ht 5\' 6"  (1.676 m)  Wt 252 lb (114.306 kg)  BMI 40.69 kg/m2  SpO2 100%  Physical Exam  Nursing note and vitals reviewed. Constitutional: He is oriented to person, place, and time. He appears well-developed and well-nourished. No distress.  HENT:  Head: Normocephalic and atraumatic.  Mouth/Throat: Oropharynx is  clear and moist.  Eyes: EOM are normal. Pupils are equal, round, and reactive to light.  Neck: Normal range of motion. Neck supple.  Cardiovascular: Normal rate and regular rhythm.   Pulmonary/Chest: Effort normal and breath sounds normal. No respiratory distress. He has no wheezes. He has no rales.  Abdominal: Soft. Bowel sounds are normal. There is tenderness. There is no rebound and no guarding.  Large area over suprapubic region with redness warmth and induration. Suspect large mass. No active purulent drainage.   Genitourinary:  Diffuse swelling to penis and scrotum. No induration, warmth or tenderness.   Musculoskeletal: Normal range of motion. He exhibits no edema and no tenderness.  Neurological: He is alert and oriented to person, place, and time.  Move all ext without deficit  Skin: Skin is warm and dry. No rash noted. There is erythema.  Psychiatric: He has a normal mood and affect. His behavior is normal.    ED Course  Procedures (including critical care time)  Labs Reviewed  CBC WITH DIFFERENTIAL - Abnormal; Notable for the following:    WBC 14.7 (*)    RBC 3.69 (*)    Hemoglobin 11.2 (*)    HCT 31.5 (*)    Neutrophils Relative 83 (*)    Lymphocytes Relative 9 (*)    Neutro Abs 12.2 (*)    Monocytes Absolute 1.2 (*)    All other components within normal limits  BASIC METABOLIC PANEL - Abnormal; Notable for the following:    Sodium 127 (*)    Potassium  3.3 (*)    Chloride 93 (*)    Glucose, Bld 203 (*)    BUN 28 (*)    GFR calc non Af Amer 51 (*)    GFR calc Af Amer 59 (*)    All other components within normal limits  CBC - Abnormal; Notable for the following:    WBC 17.4 (*)    RBC 3.84 (*)    Hemoglobin 11.5 (*)    HCT 32.9 (*)    All other components within normal limits  CREATININE, SERUM - Abnormal; Notable for the following:    Creatinine, Ser 1.51 (*)    GFR calc non Af Amer 44 (*)    GFR calc Af Amer 51 (*)    All other components within normal  limits  ANAEROBIC CULTURE  CULTURE, ROUTINE-ABSCESS  MRSA PCR SCREENING  TROPONIN I  PROTIME-INR  APTT  HEMOGLOBIN A1C   Ct Abdomen Pelvis W Contrast  02/20/2013  *RADIOLOGY REPORT*  Clinical Data: Pelvic area induration, erythema, and drainage.  CT ABDOMEN AND PELVIS WITH CONTRAST  Technique:  Multidetector CT imaging of the abdomen and pelvis was performed following the standard protocol during bolus administration of intravenous contrast.  Contrast: 80mL OMNIPAQUE IOHEXOL 300 MG/ML  SOLN  Comparison: None.  Findings: Bibasilar atelectasis.  Cardiomegaly.  Coronary artery calcification.  Lobular hepatic contour.  No focal abnormality.  Question layering gallstones or sludge.  No biliary ductal dilatation.  Unremarkable spleen, pancreas, adrenal glands.  There are bilateral cystic renal lesions, measuring water attenuation within the larger lesion on the left.  The other lesions are incompletely characterized.  No hydronephrosis or hydroureter.  No CT evidence for colitis.  Normal appendix.  Small bowel loops are normal course and caliber.  Small hiatal hernia.  No free intraperitoneal air or fluid. Prominent bilateral inguinal lymph nodes. Enlarged right common iliac chain lymph node measuring 12 mm short axis. Presumably, these are reactive.  There is scattered atherosclerotic calcification of the aorta and its branches. No aneurysmal dilatation.  Thin-walled bladder.  There is subcutaneous fat stranding and gas extending along the right inguinal region, perineum, right hemi scrotum, and right medial thigh.  Scrotal wall edema.  Bilateral hydroceles.  Prominent pampiniform plexus.  Diffuse SI joints. Multilevel degenerative changes.  Bridging anterior osteophytes.  IMPRESSION: Subcutaneous fat stranding and gas extends along the right inguinal region, perineum, right hemiscrotum, and right medial thigh, where it abuts the medial musculature.  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  Scrotal wall edema.   Bilateral hydroceles. Epididymoorchitis not excluded.  Incompletely characterized renal lesions.  Consider non emergent ultrasound follow-up.  Critical Value/emergent results were called by telephone at the time of interpretation on 02/20/2013 at 06:50 a.m. to Dr. Ranae Palms, who verbally acknowledged these results.   Original Report Authenticated By: Jearld Lesch, M.D.    Dg Chest Port 1 View  02/20/2013  *RADIOLOGY REPORT*  Clinical Data: Preop.  PORTABLE CHEST - 1 VIEW  Comparison: 07/14/2010  Findings: Cardiomegaly with vascular congestion.  Bibasilar atelectasis.  No visible effusions or acute bony abnormality.  IMPRESSION: Cardiomegaly with vascular congestion and bibasilar atelectasis.   Original Report Authenticated By: Charlett Nose, M.D.      1. CHF (congestive heart failure)   2. Essential hypertension, benign   3. Hyperglycemia   4. Other and unspecified hyperlipidemia     CRITICAL CARE Performed by: Ranae Palms, DAVID   Total critical care time: 30 Critical care time was exclusive of separately billable procedures and treating other  patients.  Critical care was necessary to treat or prevent imminent or life-threatening deterioration.  Critical care was time spent personally by me on the following activities: development of treatment plan with patient and/or surrogate as well as nursing, discussions with consultants, evaluation of patient's response to treatment, examination of patient, obtaining history from patient or surrogate, ordering and performing treatments and interventions, ordering and review of laboratory studies, ordering and review of radiographic studies, pulse oximetry and re-evaluation of patient's condition.  MDM  Discussed with Dr Janee Morn and concern for Fournier's gangrene. Broad spectrum antibiotics started and preop labs ordered. Have given IVF's judiciously given recorded EF of 15%. Pt remains stable in ED and surgery is at bedside.         Loren Racer, MD 02/20/13 1630  Loren Racer, MD 02/20/13 [phone]

## 2013-02-20 NOTE — ED Notes (Signed)
Presents with pelvic area induration, erythema, purulent drainage, began Monday night. Started as small bump and has progressed to entire pelvic region. Pt reports fever and lack of apetite. Foul odor from groin, site hot and red.

## 2013-02-20 NOTE — Anesthesia Procedure Notes (Signed)
Procedure Name: Intubation Date/Time: 02/20/2013 8:35 AM Performed by: Carmela Rima Pre-anesthesia Checklist: Patient identified, Timeout performed, Emergency Drugs available, Suction available and Patient being monitored Patient Re-evaluated:Patient Re-evaluated prior to inductionOxygen Delivery Method: Circle system utilized Preoxygenation: Pre-oxygenation with 100% oxygen Intubation Type: IV induction Ventilation: Mask ventilation without difficulty Laryngoscope Size: Mac and 4 Grade View: Grade II Tube type: Oral Tube size: 8.0 mm Number of attempts: 1 Placement Confirmation: ETT inserted through vocal cords under direct vision,  positive ETCO2 and breath sounds checked- equal and bilateral Secured at: 23 cm Tube secured with: Tape Dental Injury: Teeth and Oropharynx as per pre-operative assessment

## 2013-02-21 DIAGNOSIS — I509 Heart failure, unspecified: Secondary | ICD-10-CM

## 2013-02-21 DIAGNOSIS — N501 Vascular disorders of male genital organs: Principal | ICD-10-CM

## 2013-02-21 DIAGNOSIS — R7309 Other abnormal glucose: Secondary | ICD-10-CM

## 2013-02-21 DIAGNOSIS — I10 Essential (primary) hypertension: Secondary | ICD-10-CM

## 2013-02-21 LAB — HEMOGLOBIN A1C
Hgb A1c MFr Bld: 6.3 % — ABNORMAL HIGH (ref <5.7)
Hgb A1c MFr Bld: 6.5 % — ABNORMAL HIGH (ref <5.7)
Mean Plasma Glucose: 134 mg/dL — ABNORMAL HIGH (ref <117)
Mean Plasma Glucose: 140 mg/dL — ABNORMAL HIGH (ref <117)

## 2013-02-21 LAB — LIPID PANEL
LDL Cholesterol: 64 mg/dL (ref 0–99)
Triglycerides: 148 mg/dL (ref <150)

## 2013-02-21 LAB — BASIC METABOLIC PANEL
CO2: 25 mEq/L (ref 19–32)
Calcium: 8.4 mg/dL (ref 8.4–10.5)
Chloride: 99 mEq/L (ref 96–112)
GFR calc Af Amer: 78 mL/min — ABNORMAL LOW (ref >90)
Sodium: 134 mEq/L — ABNORMAL LOW (ref 135–145)

## 2013-02-21 LAB — GLUCOSE, CAPILLARY
Glucose-Capillary: 126 mg/dL — ABNORMAL HIGH (ref 70–99)
Glucose-Capillary: 147 mg/dL — ABNORMAL HIGH (ref 70–99)
Glucose-Capillary: 96 mg/dL (ref 70–99)

## 2013-02-21 LAB — WOUND CULTURE

## 2013-02-21 LAB — CBC
HCT: 29.5 % — ABNORMAL LOW (ref 39.0–52.0)
MCV: 85.3 fL (ref 78.0–100.0)
Platelets: 269 10*3/uL (ref 150–400)
RBC: 3.46 MIL/uL — ABNORMAL LOW (ref 4.22–5.81)
WBC: 17 10*3/uL — ABNORMAL HIGH (ref 4.0–10.5)

## 2013-02-21 MED ORDER — VANCOMYCIN HCL IN DEXTROSE 1-5 GM/200ML-% IV SOLN
1000.0000 mg | Freq: Two times a day (BID) | INTRAVENOUS | Status: DC
  Administered 2013-02-22 – 2013-02-26 (×10): 1000 mg via INTRAVENOUS
  Filled 2013-02-21 (×11): qty 200

## 2013-02-21 MED ORDER — HYDROMORPHONE HCL PF 1 MG/ML IJ SOLN
1.0000 mg | INTRAMUSCULAR | Status: DC | PRN
  Administered 2013-02-24 – 2013-03-01 (×10): 1 mg via INTRAVENOUS
  Filled 2013-02-21 (×10): qty 1

## 2013-02-21 NOTE — Progress Notes (Signed)
ANTIBIOTIC CONSULT NOTE - FOLLOW UP  Pharmacy Consult:  Vancomycin / Zosyn Indication:  Necrotizing infection of the suprapubic region  Allergies  Allergen Reactions  . Ibuprofen Nausea Only    Patient Measurements: Height: 5\' 6"  (167.6 cm) Weight: 252 lb 6.8 oz (114.5 kg) IBW/kg (Calculated) : 63.8  Vital Signs: Temp: 98.3 F (36.8 C) (03/03 1142) Temp src: Oral (03/03 1142) BP: 123/55 mmHg (03/03 0716) Pulse Rate: 67 (03/03 0716) Intake/Output from previous day: 03/02 0701 - 03/03 0700 In: 2070 [P.O.:240; I.V.:1505; IV Piggyback:325] Out: 1400 [Urine:1350; Blood:50] Intake/Output from this shift: Total I/O In: 125 [I.V.:100; IV Piggyback:25] Out: 600 [Urine:600]  Labs:  Recent Labs  02/20/13 0150 02/20/13 1115 02/21/13 1128  WBC 14.7* 17.4* 17.0*  HGB 11.2* 11.5* 10.4*  PLT 244 222 269  CREATININE 1.35 1.51* 1.07   Estimated Creatinine Clearance: 74.2 ml/min (by C-G formula based on Cr of 1.07). No results found for this basename: VANCOTROUGH, Leodis Binet, VANCORANDOM, GENTTROUGH, GENTPEAK, GENTRANDOM, TOBRATROUGH, TOBRAPEAK, TOBRARND, AMIKACINPEAK, AMIKACINTROU, AMIKACIN,  in the last 72 hours   Microbiology: Recent Results (from the past 720 hour(s))  ANAEROBIC CULTURE     Status: None   Collection Time    02/20/13  8:00 AM      Result Value Range Status   Specimen Description ABSCESS RIGHT LEG   Final   Special Requests PATIENT ON FOLLOWING ZOSYN VANCOMYCIN   Final   Gram Stain PENDING   Incomplete   Culture     Final   Value: NO ANAEROBES ISOLATED; CULTURE IN PROGRESS FOR 5 DAYS   Report Status PENDING   Incomplete  CULTURE, ROUTINE-ABSCESS     Status: None   Collection Time    02/20/13  8:00 AM      Result Value Range Status   Specimen Description ABSCESS RIGHT LEG   Final   Special Requests PATIENT ON FOLLOWING ZOSYN VANCOMYCIN   Final   Gram Stain PENDING   Incomplete   Culture NO GROWTH 1 DAY   Final   Report Status PENDING   Incomplete  MRSA  PCR SCREENING     Status: None   Collection Time    02/20/13  2:24 PM      Result Value Range Status   MRSA by PCR NEGATIVE  NEGATIVE Final   Comment:            The GeneXpert MRSA Assay (FDA     approved for NASAL specimens     only), is one component of a     comprehensive MRSA colonization     surveillance program. It is not     intended to diagnose MRSA     infection nor to guide or     monitor treatment for     MRSA infections.          Assessment: 59 YOM presented with pelvic area induration, erythema, and purulent drainage.  Now s/p debridement of necrotizing infection of the suprapubic reegion and to continue on vancomycin and Zosyn.  Patient's renal function is improving.  Vanc 3/2>> Zosyn 3/2>>  3/2 right leg abscess cx - pending   Goal of Therapy:  Vanc trough ~15 mcg/mL   Plan:  - Change vanc to 1gm IV Q12H - Continue Zosyn 3.375gm IV Q8H, 4 hr infusion - Monitor renal fxn, clinical course, vanc trough at steady-state - Consider increasing Lovenox to 55mg  SQ Q24H for VTE px in patients with BMI > 30 - Consider starting an ACEi for EF  10%     Thuy D. Laney Potash, PharmD, BCPS Pager:  [phone] 02/21/2013, 2:51 PM

## 2013-02-21 NOTE — Progress Notes (Signed)
1 Day Post-Op  Subjective: Alert and cooperative,  Very tender with dressing change, but he tolerated it fairly well with some IV morphine.  Objective: Vital signs in last 24 hours: Temp:  [97.8 F (36.6 C)-99.2 F (37.3 C)] 98.5 F (36.9 C) (03/03 0716) Pulse Rate:  [64-79] 67 (03/03 0716) Resp:  [13-24] 15 (03/03 0716) BP: (91-130)/(43-61) 123/55 mmHg (03/03 0716) SpO2:  [95 %-100 %] 96 % (03/03 0716) Weight:  [252 lb (114.306 kg)-252 lb 6.8 oz (114.5 kg)] 252 lb 6.8 oz (114.5 kg) (03/03 0315) Last BM Date: 02/19/13 NPO, we are ordering labs for today, and tomorrow, he feels warm but afebrile oral temp.  Intake/Output from previous day: 03/02 0701 - 03/03 0700 In: 2070 [P.O.:240; I.V.:1505; IV Piggyback:325] Out: 1400 [Urine:1350; Blood:50] Intake/Output this shift: Total I/O In: 125 [I.V.:100; IV Piggyback:25] Out: 200 [Urine:200]  General appearance: alert, cooperative, mild distress and pain with dressing change. Resp: clear to auscultation bilaterally GI: soft, non-tender; bowel sounds normal; no masses,  no organomegaly Skin: open groin area with ongoing drainage.  6 cm deep on right and 5 cm deep on the left.  The left side is more indurated than the right.  The area open at the base of thigh on the right has some purulent drainage, the whole thing still has some odor.  Lab Results:   Recent Labs  02/20/13 0150 02/20/13 1115  WBC 14.7* 17.4*  HGB 11.2* 11.5*  HCT 31.5* 32.9*  PLT 244 222    BMET  Recent Labs  02/20/13 0150 02/20/13 1115  NA 127*  --   K 3.3*  --   CL 93*  --   CO2 23  --   GLUCOSE 203*  --   BUN 28*  --   CREATININE 1.35 1.51*  CALCIUM 8.8  --    PT/INR  Recent Labs  02/20/13 0739  LABPROT 14.5  INR 1.15    No results found for this basename: AST, ALT, ALKPHOS, BILITOT, PROT, ALBUMIN,  in the last 168 hours   Lipase  No results found for this basename: lipase     Studies/Results: Ct Abdomen Pelvis W  Contrast  02/20/2013  *RADIOLOGY REPORT*  Clinical Data: Pelvic area induration, erythema, and drainage.  CT ABDOMEN AND PELVIS WITH CONTRAST  Technique:  Multidetector CT imaging of the abdomen and pelvis was performed following the standard protocol during bolus administration of intravenous contrast.  Contrast: 80mL OMNIPAQUE IOHEXOL 300 MG/ML  SOLN  Comparison: None.  Findings: Bibasilar atelectasis.  Cardiomegaly.  Coronary artery calcification.  Lobular hepatic contour.  No focal abnormality.  Question layering gallstones or sludge.  No biliary ductal dilatation.  Unremarkable spleen, pancreas, adrenal glands.  There are bilateral cystic renal lesions, measuring water attenuation within the larger lesion on the left.  The other lesions are incompletely characterized.  No hydronephrosis or hydroureter.  No CT evidence for colitis.  Normal appendix.  Small bowel loops are normal course and caliber.  Small hiatal hernia.  No free intraperitoneal air or fluid. Prominent bilateral inguinal lymph nodes. Enlarged right common iliac chain lymph node measuring 12 mm short axis. Presumably, these are reactive.  There is scattered atherosclerotic calcification of the aorta and its branches. No aneurysmal dilatation.  Thin-walled bladder.  There is subcutaneous fat stranding and gas extending along the right inguinal region, perineum, right hemi scrotum, and right medial thigh.  Scrotal wall edema.  Bilateral hydroceles.  Prominent pampiniform plexus.  Diffuse SI joints. Multilevel degenerative changes.  Bridging anterior osteophytes.  IMPRESSION: Subcutaneous fat stranding and gas extends along the right inguinal region, perineum, right hemiscrotum, and right medial thigh, where it abuts the medial musculature.  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  Scrotal wall edema.  Bilateral hydroceles. Epididymoorchitis not excluded.  Incompletely characterized renal lesions.  Consider non emergent ultrasound follow-up.  Critical  Value/emergent results were called by telephone at the time of interpretation on 02/20/2013 at 06:50 a.m. to Dr. Ranae Palms, who verbally acknowledged these results.   Original Report Authenticated By: Jearld Lesch, M.D.    Dg Chest Port 1 View  02/20/2013  *RADIOLOGY REPORT*  Clinical Data: Preop.  PORTABLE CHEST - 1 VIEW  Comparison: 07/14/2010  Findings: Cardiomegaly with vascular congestion.  Bibasilar atelectasis.  No visible effusions or acute bony abnormality.  IMPRESSION: Cardiomegaly with vascular congestion and bibasilar atelectasis.   Original Report Authenticated By: Charlett Nose, M.D.     Medications: . carvedilol  6.25 mg Oral BID WC  . enoxaparin  40 mg Subcutaneous Q24H  . furosemide  20 mg Oral Daily  . insulin aspart  0-15 Units Subcutaneous TID WC  . insulin aspart  0-5 Units Subcutaneous QHS  . insulin aspart  4 Units Subcutaneous TID WC  . piperacillin-tazobactam (ZOSYN)  IV  3.375 g Intravenous Q8H  . simvastatin  40 mg Oral q1800  . tamsulosin  0.4 mg Oral QPC supper  . vancomycin  1,000 mg Intravenous Q24H    Assessment/Plan necrotizing infection of the suprapubic region, right groin, right thigh, and right hemiscrotum; s/p debridement of necrotizing infection of the suprapubic region, right groin, right thigh, and right hemiscrotum  Surgeon: TSUEI,MATTHEW K.,02/20/2013.  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  No hx of MI.  He is able to work and daily activities do not seem impaired. Hypertension Hyperlipidemia Body mass index is 40.7 Hx of ETOH and tobacco abuse.   Plan:  Medicine to see,  Continue home meds, Carb modified diet today, and NPO, may need to go to the OR tomorrow.   LOS: 1 day    Rodriguez,Trevor 02/21/2013

## 2013-02-21 NOTE — Progress Notes (Signed)
  Echocardiogram 2D Echocardiogram has been performed.  Trevor Rodriguez, Trevor Rodriguez 02/21/2013, 5:28 PM

## 2013-02-21 NOTE — Progress Notes (Signed)
Met pt's family in hallway who told me of pt's location. Pt was talking and alert when we arrived. I had prayer w/pt and family.  Marjory Lies Chaplain  02/20/13 2300  Clinical Encounter Type  Visited With Patient and family together

## 2013-02-21 NOTE — Consult Note (Signed)
[facility] Consult F/U Note  TEAM 1 - Stepdown/ICU TEAM   Trevor Rodriguez OZD:[id number] DOB: [birth year] DOA: 02/20/2013 PCP: Eino Farber, MD  Brief narrative: 72 yo male who presented with one week of worsening swelling and erythema in his suprapubic region. This apparently was drained in the office by his primary care physician, but the swelling and pain worsened and extended down into his scrotum and thigh.  Pt was taken to OR on 02/20/13 for debridement of lower abdominal wall, suprapubic region, scrotum and R thigh 2/2 worsening infection.   [facility] was asked to provide medical consultative care during his hospital stay.  Assessment/Plan:  Fournier's gangrene  Per primary service  HTN Well controlled at present   Hyperglycemia was not medicated at home and no known hx of DM - A1C qualifies for diagnosis of DM - begin to educate pt - CBG currently well controlled  HLD cont home statin   Hx of severe systolic CHF TTE 2595 showed EF 10% w/ diffuse hypokinesis and restrictive physiology - continue home coreg and lasix in the setting of well compensated CHF - repeat TTE to assess current cardiac status - cardiac cath in 2011 failed to reveal signif CAD - add ACE if renal function proves to be stable   Ongoing tobacco abuse  Cessation counseled   BPH Continue home flomax dose  Morbid obesity Body mass index is 40.7  Code Status: FULL  Antibiotics: Zosyn Vancomycin  DVT prophylaxis: Lovenox  HPI/Subjective: The patient is resting comfortably today with no complaints at the time of my evaluation.  Objective: Blood pressure 123/55, pulse 67, temperature 98.3 F (36.8 C), temperature source Oral, resp. rate 15, height 5\' 6"  (1.676 m), weight 114.5 kg (252 lb 6.8 oz), SpO2 96.00%.  Intake/Output Summary (Last 24 hours) at 02/21/13 1616 Last data filed at 02/21/13 1517  Gross per 24 hour  Intake   1145 ml  Output   1750 ml  Net   -605 ml    Exam: General: No acute respiratory distress Lungs: Clear to auscultation bilaterally without wheezes or crackles Cardiovascular: Regular rate and rhythm without murmur gallop or rub  Abdomen: Nontender, nondistended, soft, bowel sounds positive, no rebound, no ascites, no appreciable mass Extremities: No significant cyanosis, clubbing, or edema bilateral lower extremities  Data Reviewed: Basic Metabolic Panel:  Recent Labs Lab 02/20/13 0150 02/20/13 1115 02/21/13 1128  NA 127*  --  134*  K 3.3*  --  3.7  CL 93*  --  99  CO2 23  --  25  GLUCOSE 203*  --  97  BUN 28*  --  18  CREATININE 1.35 1.51* 1.07  CALCIUM 8.8  --  8.4   CBC:  Recent Labs Lab 02/20/13 0150 02/20/13 1115 02/21/13 1128  WBC 14.7* 17.4* 17.0*  NEUTROABS 12.2*  --   --   HGB 11.2* 11.5* 10.4*  HCT 31.5* 32.9* 29.5*  MCV 85.4 85.7 85.3  PLT 244 222 269   Cardiac Enzymes:  Recent Labs Lab 02/20/13 0738  TROPONINI <0.30   CBG:  Recent Labs Lab 02/20/13 1606 02/20/13 1700 02/20/13 2302 02/21/13 0719 02/21/13 1228  GLUCAP 105* 135* 126* 103* 100*    Recent Results (from the past 240 hour(s))  ANAEROBIC CULTURE     Status: None   Collection Time    02/20/13  8:00 AM      Result Value Range Status   Specimen Description ABSCESS RIGHT LEG   Final   Special  Requests PATIENT ON FOLLOWING ZOSYN VANCOMYCIN   Final   Gram Stain PENDING   Incomplete   Culture     Final   Value: NO ANAEROBES ISOLATED; CULTURE IN PROGRESS FOR 5 DAYS   Report Status PENDING   Incomplete  CULTURE, ROUTINE-ABSCESS     Status: None   Collection Time    02/20/13  8:00 AM      Result Value Range Status   Specimen Description ABSCESS RIGHT LEG   Final   Special Requests PATIENT ON FOLLOWING ZOSYN VANCOMYCIN   Final   Gram Stain PENDING   Incomplete   Culture NO GROWTH 1 DAY   Final   Report Status PENDING   Incomplete  MRSA PCR SCREENING     Status: None   Collection Time    02/20/13  2:24 PM      Result  Value Range Status   MRSA by PCR NEGATIVE  NEGATIVE Final   Comment:            The GeneXpert MRSA Assay (FDA     approved for NASAL specimens     only), is one component of a     comprehensive MRSA colonization     surveillance program. It is not     intended to diagnose MRSA     infection nor to guide or     monitor treatment for     MRSA infections.     Studies:  Recent x-ray studies have been reviewed in detail by the Attending Physician  Scheduled Meds:  Reviewed in detail by the Attending Physician   [facility] T  [facility] Office  [phone] Pager - Text Page per Amion as per below:  On-Call/Text Page:      [email or web address]      password [email or web address]  If 7PM-7AM, please contact night-coverage [email or web address] Password [email or web address] 02/21/2013, 4:16 PM   LOS: 1 day

## 2013-02-21 NOTE — Progress Notes (Signed)
Patient interviewed and examined, agree with PA note above. I examined his wounds at the dressing changes. We will reassess tomorrow for need for further debridement and dressing change in operating room.  Mariella Saa MD, FACS  02/21/2013 7:23 PM

## 2013-02-22 ENCOUNTER — Encounter (HOSPITAL_COMMUNITY): Payer: Self-pay | Admitting: Surgery

## 2013-02-22 DIAGNOSIS — E119 Type 2 diabetes mellitus without complications: Secondary | ICD-10-CM | POA: Diagnosis present

## 2013-02-22 LAB — CBC
MCV: 86.3 fL (ref 78.0–100.0)
Platelets: 300 10*3/uL (ref 150–400)
RBC: 3.51 MIL/uL — ABNORMAL LOW (ref 4.22–5.81)
RDW: 14.6 % (ref 11.5–15.5)
WBC: 16.1 10*3/uL — ABNORMAL HIGH (ref 4.0–10.5)

## 2013-02-22 LAB — GLUCOSE, CAPILLARY
Glucose-Capillary: 132 mg/dL — ABNORMAL HIGH (ref 70–99)
Glucose-Capillary: 171 mg/dL — ABNORMAL HIGH (ref 70–99)
Glucose-Capillary: 118 mg/dL — ABNORMAL HIGH (ref 70–99)

## 2013-02-22 LAB — BASIC METABOLIC PANEL
CO2: 25 mEq/L (ref 19–32)
Calcium: 8.5 mg/dL (ref 8.4–10.5)
Creatinine, Ser: 0.99 mg/dL (ref 0.50–1.35)
GFR calc Af Amer: >90 mL/min (ref >90)
Sodium: 134 mEq/L — ABNORMAL LOW (ref 135–145)

## 2013-02-22 NOTE — Transfer of Care (Signed)
Immediate Anesthesia Transfer of Care Note  Patient: Trevor Rodriguez  Procedure(s) Performed: Procedure(s): Debridemnt of lower abdominal wall, suprapubic region, scrotum and right thigh (N/A)  Patient Location: PACU  Anesthesia Type:General  Level of Consciousness: awake, oriented, sedated and patient cooperative  Airway & Oxygen Therapy: Patient Spontanous Breathing  Post-op Assessment: Report given to PACU RN, Post -op Vital signs reviewed and stable and Patient moving all extremities  Post vital signs: stable  Complications: No apparent anesthesia complications

## 2013-02-22 NOTE — Evaluation (Signed)
Physical Therapy Evaluation Patient Details Name: Trevor Rodriguez MRN: [id number] DOB: December 07, 1941 Today's Date: 02/22/2013 Time: 3086-5784 PT Time Calculation (min): 26 min  PT Assessment / Plan / Recommendation Clinical Impression  72 y.o. male admitted to [facility] for i&D of lower abdomen and scotum due to wound.  He presents today with pain in his lower abdomen and genitals as well as dereased ability to walk and move due to pain and swelling.  He had to use an RW for balance and stability while walking today and required assist to get into and out of bed.  He would benefit from HHPT and 24 hour assist at discharge.      PT Assessment  Patient needs continued PT services    Follow Up Recommendations  Home health PT;Supervision/Assistance - 24 hour    Does the patient have the potential to tolerate intense rehabilitation     yes  Barriers to Discharge None None    Equipment Recommendations  Rolling walker with 5" wheels    Recommendations for Other Services   none  Frequency Min 3X/week    Precautions / Restrictions Precautions Precautions: Fall   Pertinent Vitals/Pain 7/10 abdominal pain/scrotal pain, pre medicated, repositioned in bed after treatment      Mobility  Bed Mobility Bed Mobility: Supine to Sit;Sitting - Scoot to [place] of Bed;Sit to Supine Supine to Sit: 4: Min assist;With rails;HOB elevated Sitting - Scoot to Edge of Bed: 4: Min assist;With rail Sit to Supine: 4: Min assist;With rail;HOB elevated Scooting to HOB: 1: +2 Total assist;With rail;Other (comment) (bed in trendelenberg) Scooting to [facility]: Patient Percentage: 40% Details for Bed Mobility Assistance: min hand held assist to boost trunk up to sitting, min hand held assist to give pt some leverage to scoot out to EOB.  Min assist to help with one leg to get back in bed.  2 person assist to boost trunk and body up in bed with bed in trendelenberg, pt helping with bil arms on bed rails for all bed mobility.    Transfers Transfers: Sit to Stand;Stand to Sit Sit to Stand: 4: Min assist;From elevated surface;With upper extremity assist;From bed Stand to Sit: 4: Min assist;With upper extremity assist;To elevated surface;To bed Details for Transfer Assistance: min assist to support trunk for balance due to odd angle that pt is trying to stand from (he is sitting in right sidelying on his elbow due to pain and trying to stand straight up from this position) .  Min assist to help control descent to sit on elevated bed.  Better sitting position when he went back to bed then first getting up OOB.   Ambulation/Gait Ambulation/Gait Assistance: 4: Min assist Ambulation Distance (Feet): 15 Feet Assistive device: Rolling walker Ambulation/Gait Assistance Details: min assist for balance due to abnormal gait pattern. Feet spread wide outside of RW due to pain and swelling in genital area.   Gait Pattern: Step-to pattern;Wide base of support;Trunk flexed Gait velocity: less than 1.8 ft/sec putting him at risk for recurrent falls General Gait Details: verbal cues for upright posture.          PT Diagnosis: Difficulty walking;Abnormality of gait;Generalized weakness;Acute pain  PT Problem List: Decreased strength;Decreased activity tolerance;Decreased balance;Decreased mobility;Decreased knowledge of use of DME;Pain PT Treatment Interventions: DME instruction;Gait training;Stair training;Functional mobility training;Therapeutic activities;Therapeutic exercise;Balance training;Neuromuscular re-education;Patient/family education   PT Goals Acute Rehab PT Goals PT Goal Formulation: With patient Time For Goal Achievement: 03/08/13 Potential to Achieve Goals: Good Pt will go Supine/Side to  Sit: with modified independence;with HOB 0 degrees PT Goal: Supine/Side to Sit - Progress: Goal set today Pt will go Sit to Supine/Side: with modified independence;with HOB 0 degrees PT Goal: Sit to Supine/Side - Progress: Goal  set today Pt will go Sit to Stand: with supervision;with upper extremity assist PT Goal: Sit to Stand - Progress: Goal set today Pt will go Stand to Sit: with supervision;with upper extremity assist PT Goal: Stand to Sit - Progress: Goal set today Pt will Transfer Bed to Chair/Chair to Bed: with supervision PT Transfer Goal: Bed to Chair/Chair to Bed - Progress: Goal set today Pt will Ambulate: 51 - 150 feet;with supervision;with least restrictive assistive device PT Goal: Ambulate - Progress: Goal set today Pt will Go Up / Down Stairs: 1-2 stairs;with min assist;with rail(s) PT Goal: Up/Down Stairs - Progress: Goal set today  Visit Information  Last PT Received On: 02/22/13 Assistance Needed: +1    Subjective Data  Subjective: Pt reports that he has yet to be up on his feet since surgery Patient Stated Goal: to go home   Prior Functioning  Home Living Lives With: Family (lives with grandson) Available Help at Discharge: Family;Available 24 hours/day (if needed he can arrange 24 hour assist) Type of Home: House Home Access: Stairs to enter [employer] of Steps: 2 Entrance Stairs-Rails: Right Home Layout: Two level (pt reports he does not need to go upstairs) Bathroom Shower/Tub: Tub/shower unit;C[employer]: Standard Home Adaptive Equipment: Grab bars around toilet;Grab bars in shower Prior Function Level of Independence: Independent Able to Take Stairs?: Yes Driving: Yes 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 Communication Communication: No difficulties Dominant Hand: Right    Cognition  Cognition Overall Cognitive Status: Appears within functional limits for tasks assessed/performed Arousal/Alertness: Awake/alert Orientation Level: Appears intact for tasks assessed Behavior During Session: [facility] for tasks performed    Extremity/Trunk Assessment Right Lower Extremity Assessment RLE ROM/Strength/Tone:  Deficits;Due to pain RLE ROM/Strength/Tone Deficits: ankle 4/5, knee 4-/5, hip 3-/5 RLE Sensation: WFL - Light Touch Left Lower Extremity Assessment LLE ROM/Strength/Tone: Deficits;Due to pain LLE ROM/Strength/Tone Deficits: ankle 4/5, knee 4-/5, hip 3-/5 LLE Sensation: WFL - Light Touch   Balance Static Sitting Balance Static Sitting - Balance Support: Right upper extremity supported;Feet supported Static Sitting - Level of Assistance: 4: Min assist Static Sitting - Comment/# of Minutes: min assist due to pt leaning to his far right due to discomfort in sitting.   Static Standing Balance Static Standing - Balance Support: Right upper extremity supported;Left upper extremity supported Static Standing - Level of Assistance: 4: Min assist Static Standing - Comment/# of Minutes: min assist for balance due to abnormal stanidng posture (flexed trunk and wide BOS due to pain in genitals and lower abdomen).    End of Session PT - End of Session Activity Tolerance: Patient limited by pain Patient left: in bed;with call bell/phone within reach Nurse Communication: Mobility status    Lurena Joiner B. Medendorp, PT, DPT [phone]   02/22/2013, 5:13 PM

## 2013-02-22 NOTE — Progress Notes (Signed)
Utilization Review Completed.   Kimberly Tucker, RN, BSN Nurse Case Manager  [phone]  

## 2013-02-22 NOTE — Progress Notes (Signed)
2 Days Post-Op  Subjective: Alert and cooperative,  Reports pain is well managed with exception of dressing changes.  No other concerns.  Voices being hungry.  Just evaluated by Dr. Johna Sheriff and dressing changed  Objective: Vital signs in last 24 hours: Temp:  [98.1 F (36.7 C)-98.5 F (36.9 C)] 98.3 F (36.8 C) (03/04 0355) Pulse Rate:  [70-78] 70 (03/04 0355) Resp:  [13-16] 16 (03/04 0355) BP: (116-138)/(55-62) 124/59 mmHg (03/04 0355) SpO2:  [98 %-99 %] 99 % (03/04 0355) Weight:  [249 lb 1.9 oz (113 kg)] 249 lb 1.9 oz (113 kg) (03/04 0354) Last BM Date: 02/15/13 (per pt's verbal report)   Intake/Output from previous day: 03/03 0701 - 03/04 0700 In: 2462 [P.O.:1200; I.V.:937; IV Piggyback:325] Out: 1850 [Urine:1850] Intake/Output this shift:    General appearance: alert, cooperative, Resp: clear to auscultation bilaterally GI: soft, non-tender; bowel sounds normal; no masses,  no organomegaly Skin: Groin dressing.  Recently applied, clean dry and intact.  Lab Results:   Recent Labs  02/21/13 1128 02/22/13 0321  WBC 17.0* 16.1*  HGB 10.4* 10.5*  HCT 29.5* 30.3*  PLT 269 300    BMET  Recent Labs  02/21/13 1128 02/22/13 0321  NA 134* 134*  K 3.7 4.0  CL 99 99  CO2 25 25  GLUCOSE 97 107*  BUN 18 15  CREATININE 1.07 0.99  CALCIUM 8.4 8.5   PT/INR  Recent Labs  02/20/13 0739  LABPROT 14.5  INR 1.15    No results found for this basename: AST, ALT, ALKPHOS, BILITOT, PROT, ALBUMIN,  in the last 168 hours   Lipase  No results found for this basename: lipase     Studies/Results: No results found.  Medications: . carvedilol  6.25 mg Oral BID WC  . enoxaparin  40 mg Subcutaneous Q24H  . insulin aspart  0-15 Units Subcutaneous TID WC  . insulin aspart  0-5 Units Subcutaneous QHS  . insulin aspart  4 Units Subcutaneous TID WC  . piperacillin-tazobactam (ZOSYN)  IV  3.375 g Intravenous Q8H  . simvastatin  40 mg Oral q1800  . tamsulosin  0.4 mg Oral  QPC supper  . vancomycin  1,000 mg Intravenous Q12H    Assessment/Plan # necrotizing infection of the suprapubic region, right groin, right thigh, and right hemiscrotum - s/p debridement of necrotizing infection of the suprapubic region, right groin, right thigh, and right hemiscrotum  Surgeon: Manus Rudd K.,02/20/2013.  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 Pt says it's from hypertension and not taking meds.  No hx of MI.  He is able to work and daily activities do not seem impaired.  # Hypertension # Hyperlipidemia # Body mass index is 40.7 - needs weight loss # Hx of ETOH and tobacco abuse. # Diabetes: A1c of 6/5 on 02/21/13 - needs improved control and weight loss   Plan:  Medicine to see,  Continue home meds, resume Carb modified diet today.  PT/OT to evaluate.  Okay to transfer to med/surg bed if medically stable.  Will continue to evaluate on daily basis  LOS: 2 days   Andrena Mews, DO [facility] Resident - PGY-2 02/22/2013 8:46 AM

## 2013-02-22 NOTE — Consult Note (Signed)
[facility] Consult F/U Note [place] TEAM 1 - Stepdown/ICU TEAM   Trevor Rodriguez ZOX:[id number] DOB: September 10, 1941 DOA: 02/20/2013 PCP: Eino Farber, MD  Brief narrative: 72 yo male who presented with one week of worsening swelling and erythema in his suprapubic region. This apparently was drained in the office by his primary care physician, but the swelling and pain worsened and extended down into his scrotum and thigh.  Pt was taken to OR on 02/20/13 for debridement of lower abdominal wall, suprapubic region, scrotum and R thigh 2/2 worsening infection.   [facility] was asked to provide medical consultative care during his hospital stay.  Assessment/Plan:  Fournier's gangrene  Per primary service  HTN Well controlled at present   Hyperglycemia was not medicated at home and no known hx of DM - A1C qualifies for diagnosis of DM - educate pt - CBG currently well controlled  HLD cont home statin   Hx of severe systolic CHF TTE 8119 showed EF 10% w/ diffuse hypokinesis and restrictive physiology - continue home coreg and lasix in the setting of well compensated CHF - repeat TTE to assess current cardiac status - cardiac cath in 2011 failed to reveal signif CAD - add ACE if renal function proves to be stable   Ongoing tobacco abuse  Cessation counseled   BPH Continue home flomax dose  Morbid obesity Body mass index is 40.7  Code Status: FULL  Antibiotics: Zosyn Vancomycin  DVT prophylaxis: Lovenox  HPI/Subjective: The patient is laying in bed when evaluated-he has no complaints.  Objective: Blood pressure 120/53, pulse 78, temperature 98.1 F (36.7 C), temperature source Oral, resp. rate 18, height 5\' 6"  (1.676 m), weight 113 kg (249 lb 1.9 oz), SpO2 98.00%.  Intake/Output Summary (Last 24 hours) at 02/22/13 1733 Last data filed at 02/22/13 1600  Gross per 24 hour  Intake 2535.33 ml  Output   2600 ml  Net -64.67 ml   Exam: General: No acute  respiratory distress Lungs: Clear to auscultation bilaterally without wheezes or crackles Cardiovascular: Regular rate and rhythm without murmur gallop or rub  Abdomen: Nontender, nondistended, soft, bowel sounds positive, no rebound, no ascites, no appreciable mass Extremities: No significant cyanosis, clubbing, or edema bilateral lower extremities  Data Reviewed: Basic Metabolic Panel:  Recent Labs Lab 02/20/13 0150 02/20/13 1115 02/21/13 1128 02/22/13 0321  NA 127*  --  134* 134*  K 3.3*  --  3.7 4.0  CL 93*  --  99 99  CO2 23  --  25 25  GLUCOSE 203*  --  97 107*  BUN 28*  --  18 15  CREATININE 1.35 1.51* 1.07 0.99  CALCIUM 8.8  --  8.4 8.5   CBC:  Recent Labs Lab 02/20/13 0150 02/20/13 1115 02/21/13 1128 02/22/13 0321  WBC 14.7* 17.4* 17.0* 16.1*  NEUTROABS 12.2*  --   --   --   HGB 11.2* 11.5* 10.4* 10.5*  HCT 31.5* 32.9* 29.5* 30.3*  MCV 85.4 85.7 85.3 86.3  PLT 244 222 269 300   Cardiac Enzymes:  Recent Labs Lab 02/20/13 0738  TROPONINI <0.30   CBG:  Recent Labs Lab 02/21/13 1228 02/21/13 1749 02/21/13 2125 02/22/13 0820 02/22/13 1153  GLUCAP 100* 147* 96 116* 132*    Recent Results (from the past 240 hour(s))  ANAEROBIC CULTURE     Status: None   Collection Time    02/20/13  8:00 AM      Result Value Range Status   Specimen  Description ABSCESS RIGHT LEG   Final   Special Requests PATIENT ON FOLLOWING ZOSYN VANCOMYCIN   Final   Gram Stain     Final   Value: FEW WBC PRESENT,BOTH PMN AND MONONUCLEAR     RARE SQUAMOUS EPITHELIAL CELLS PRESENT     FEW GRAM POSITIVE COCCI IN CLUSTERS     IN PAIRS   Culture     Final   Value: NO ANAEROBES ISOLATED; CULTURE IN PROGRESS FOR 5 DAYS   Report Status PENDING   Incomplete  CULTURE, ROUTINE-ABSCESS     Status: None   Collection Time    02/20/13  8:00 AM      Result Value Range Status   Specimen Description ABSCESS RIGHT LEG   Final   Special Requests PATIENT ON FOLLOWING ZOSYN VANCOMYCIN   Final    Gram Stain     Final   Value: FEW WBC PRESENT,BOTH PMN AND MONONUCLEAR     RARE SQUAMOUS EPITHELIAL CELLS PRESENT     FEW GRAM POSITIVE COCCI IN CLUSTERS     IN PAIRS   Culture FEW ENTEROCOCCUS SPECIES   Final   Report Status PENDING   Incomplete  MRSA PCR SCREENING     Status: None   Collection Time    02/20/13  2:24 PM      Result Value Range Status   MRSA by PCR NEGATIVE  NEGATIVE Final   Comment:            The GeneXpert MRSA Assay (FDA     approved for NASAL specimens     only), is one component of a     comprehensive MRSA colonization     surveillance program. It is not     intended to diagnose MRSA     infection nor to guide or     monitor treatment for     MRSA infections.     Studies:  Recent x-ray studies have been reviewed in detail by the Attending Physician  Scheduled Meds:  Reviewed in detail by the Attending Physician   [facility]  [facility] Office  [phone] Pager - Text Page per Amion as per below:  On-Call/Text Page:      [email or web address]      password [email or web address]  If 7PM-7AM, please contact night-coverage [email or web address] Password [facility] 02/22/2013, 5:33 PM   LOS: 2 days

## 2013-02-22 NOTE — Progress Notes (Signed)
Agree with noted above. I carefully examined all his wounds and everything appears to be improving. We'll continue current wound care and antibiotics.

## 2013-02-23 DIAGNOSIS — N493 Fournier gangrene: Secondary | ICD-10-CM | POA: Diagnosis present

## 2013-02-23 LAB — GLUCOSE, CAPILLARY
Glucose-Capillary: 107 mg/dL — ABNORMAL HIGH (ref 70–99)
Glucose-Capillary: 98 mg/dL (ref 70–99)

## 2013-02-23 LAB — BASIC METABOLIC PANEL
Calcium: 8.7 mg/dL (ref 8.4–10.5)
GFR calc Af Amer: >90 mL/min (ref >90)
GFR calc non Af Amer: 85 mL/min — ABNORMAL LOW (ref >90)
Glucose, Bld: 130 mg/dL — ABNORMAL HIGH (ref 70–99)
Potassium: 4.5 mEq/L (ref 3.5–5.1)
Sodium: 133 mEq/L — ABNORMAL LOW (ref 135–145)

## 2013-02-23 LAB — CBC
Hemoglobin: 10.5 g/dL — ABNORMAL LOW (ref 13.0–17.0)
Platelets: 323 10*3/uL (ref 150–400)
RBC: 3.56 MIL/uL — ABNORMAL LOW (ref 4.22–5.81)
WBC: 14 10*3/uL — ABNORMAL HIGH (ref 4.0–10.5)

## 2013-02-23 LAB — CULTURE, ROUTINE-ABSCESS

## 2013-02-23 MED ORDER — LISINOPRIL 2.5 MG PO TABS
2.5000 mg | ORAL_TABLET | Freq: Every day | ORAL | Status: DC
  Administered 2013-02-23 – 2013-03-02 (×8): 2.5 mg via ORAL
  Filled 2013-02-23 (×9): qty 1

## 2013-02-23 NOTE — Progress Notes (Signed)
3 Days Post-Op  Subjective: Alert and cooperative with dressing changes.  Reports pain is well managed with exception of dressing changes.  No other concerns.   Objective: Vital signs in last 24 hours: Temp:  [98.1 F (36.7 C)-99.6 F (37.6 C)] 99.6 F (37.6 C) (03/05 0700) Pulse Rate:  [65-82] 65 (03/05 0400) Resp:  [14-19] 14 (03/05 0400) BP: (118-139)/(50-63) 139/63 mmHg (03/05 0400) SpO2:  [96 %-99 %] 99 % (03/05 0400) Weight:  [245 lb 9.5 oz (111.4 kg)] 245 lb 9.5 oz (111.4 kg) (03/05 0400) Last BM Date: 02/20/13   Intake/Output from previous day: 03/04 0701 - 03/05 0700 In: 2365 [P.O.:1180; I.V.:660; IV Piggyback:525] Out: 3550 [Urine:3550] Intake/Output this shift:    General appearance: alert, cooperative, GI: soft, non-tender; bowel sounds normal; no masses,  no organomegaly Skin: Suprapubic wound evaluated and superficially sharply debrided at bedside by Dr. Johna Sheriff.  R groin and hemiscrotum improving with mainly serosanguanous exudate (initial minimal pustular exudate) with some superficial necrosis, easily bluntly debrided.    Lab Results:   Recent Labs  02/22/13 0321 02/23/13 0447  WBC 16.1* 14.0*  HGB 10.5* 10.5*  HCT 30.3* 30.2*  PLT 300 323    BMET  Recent Labs  02/22/13 0321 02/23/13 0447  NA 134* 133*  K 4.0 4.5  CL 99 100  CO2 25 25  GLUCOSE 107* 130*  BUN 15 11  CREATININE 0.99 0.85  CALCIUM 8.5 8.7   PT/INR No results found for this basename: LABPROT, INR,  in the last 72 hours  No results found for this basename: AST, ALT, ALKPHOS, BILITOT, PROT, ALBUMIN,  in the last 168 hours   Lipase  No results found for this basename: lipase     Studies/Results: No results found.  Medications: . carvedilol  6.25 mg Oral BID WC  . enoxaparin  40 mg Subcutaneous Q24H  . insulin aspart  0-15 Units Subcutaneous TID WC  . insulin aspart  0-5 Units Subcutaneous QHS  . insulin aspart  4 Units Subcutaneous TID WC  . piperacillin-tazobactam  (ZOSYN)  IV  3.375 g Intravenous Q8H  . simvastatin  40 mg Oral q1800  . tamsulosin  0.4 mg Oral QPC supper  . vancomycin  1,000 mg Intravenous Q12H    Assessment/Plan # necrotizing infection (Fournier's Gangrene) of the suprapubic region, right groin, right thigh, and right hemiscrotum - s/p debridement of necrotizing infection of the suprapubic region, right groin, right thigh, and right hemiscrotum  Surgeon: Manus Rudd K.,02/20/2013.  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 Pt says it's from hypertension and not taking meds.  No hx of MI.  He is able to work and daily activities do not seem impaired.  # Hypertension # Hyperlipidemia # Body mass index is 40.7 - needs weight loss # Hx of ETOH and tobacco abuse. # Diabetes: A1c of 6/5 on 02/21/13 - needs improved control and weight loss   Plan:  Medicine to see,  Continue home meds, resume Carb modified diet today.  PT/OT to evaluate.  Transfer to med/surg bed today.  Will continue to evaluate on daily basis  LOS: 3 days   Andrena Mews, DO [facility] Resident - PGY-2 02/23/2013 8:33 AM

## 2013-02-23 NOTE — Evaluation (Signed)
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 Ot to follow acutely. Recommend HHOT for d/c planning pending progress. Pt currently will require (A) for all mobility     OT Assessment  Patient needs continued OT Services    Follow Up Recommendations  Home health OT    Barriers to Discharge      Equipment Recommendations  3 in 1 bedside comode;Other (comment) (RW)    Recommendations for Other Services    Frequency  Min 2X/week    Precautions / Restrictions Precautions Precautions: Fall Restrictions Weight Bearing Restrictions: No   Pertinent Vitals/Pain  pain at scrotum    ADL  Eating/Feeding: Set up Where Assessed - Eating/Feeding: Chair Grooming: Wash/dry hands;Set up Where Assessed - Grooming: Supported sitting Lower Body Dressing: +1 Total assistance Where Assessed - Lower Body Dressing: Supine, head of bed up Toilet Transfer: Maximal assistance Toilet Transfer Method: Sit to stand Toilet Transfer Equipment: Regular height toilet Equipment Used: Rolling walker Transfers/Ambulation Related to ADLs: Pt completed EOB sit<>Stand and pivot to the Rt to chair. Pt with strong posterior lean at EOB due to abdominal pain. Pt pulling on RW and using Rt Bed rail. Pt static standing with min guard (A). Pt has pain with transitions that involve hip flexion. ADL Comments: Pt agreeable to OOB. Pt provided towel rolled at scrotum for edema management. Pt tolerating OOB well at this time. Pt with chair back slightly reclined for comfort. Pt sits with a posterior pelvic tilt at this time. Pt encouraged OOB to chair for all 3 meals and remain oob for  1 hour each meal. Pt agreeable to  attempt    OT Diagnosis: Generalized weakness;Acute pain  OT Problem List: Decreased strength;Decreased activity tolerance;Impaired balance (sitting and/or standing);Decreased safety awareness;Decreased knowledge of use of DME or AE;Decreased knowledge of precautions;Obesity;Pain OT Treatment Interventions: Self-care/ADL training;DME and/or AE instruction;Energy conservation;Therapeutic activities;Patient/family education;Balance training   OT Goals Acute Rehab OT Goals OT Goal Formulation: With patient Time For Goal Achievement: 03/09/13 Potential to Achieve Goals: Good ADL Goals Pt Will Perform Upper Body Bathing: with set-up;Sitting, chair;Supported ADL Goal: Product manager - Progress: Goal set today Pt Will Perform Lower Body Bathing: with min assist;Sit to stand from chair;with adaptive equipment ADL Goal: Lower Body Bathing - Progress: Goal set today Pt Will Perform Upper Body Dressing: with set-up;Sitting, chair;Supported ADL Goal: Location manager Dressing - Progress: Goal set today Pt Will Perform Lower Body Dressing: with min assist;Sit to stand from chair;with adaptive equipment ADL Goal: Lower Body Dressing - Progress: Goal set today Pt Will Transfer to Toilet: with supervision;Ambulation;3-in-1 ADL Goal: Toilet Transfer - Progress: Goal set today Pt Will Perform Toileting - Hygiene: with supervision;Sit to stand from 3-in-1/toilet ADL Goal: Toileting - Hygiene - Progress: Goal set today Miscellaneous OT Goals Miscellaneous OT Goal #1: Pt will complete bed mobility at Supervision level as precursor to adls OT Goal: Miscellaneous Goal #1 - Progress: Goal set today  Visit Information  Last OT Received On: 02/23/13 Assistance Needed: +1    Subjective Data  Subjective: "I just didnt take care of myself at my age" Patient Stated Goal: to return home   Prior Functioning     Home Living Lives With: Family (  lives with grandson) Available  Help at Discharge: Family;Available 24 hours/day Type of Home: House Home Access: Stairs to enter [employer] of Steps: 2 Entrance Stairs-Rails: Right Home Layout: Two level (requires 2nd floor) Bathroom Shower/Tub: Tub/shower unit;Curtain Firefighter: Standard Home Adaptive Equipment: Grab bars around toilet;Grab bars in shower Prior Function Level of Independence: Independent Able to Take Stairs?: Yes Driving: Yes 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 Pt tearful during session at one point and expressed frustration with current condition. Pt encouraged to keep working with therapy to regain incr independence.   Extremity/Trunk Assessment Right Upper Extremity Assessment RUE ROM/Strength/Tone: Within functional levels RUE Sensation: WFL - Light Touch RUE Coordination: WFL - gross/fine motor Left Upper Extremity Assessment LUE ROM/Strength/Tone: Within functional levels LUE Sensation: WFL - Light Touch LUE Coordination: WFL - gross/fine motor Trunk Assessment Trunk Assessment: Normal     Mobility Bed Mobility Bed Mobility: Supine to Sit;Sitting - Scoot to Edge of Bed Supine to Sit: 2: Max assist;With rails;HOB elevated Sitting - Scoot to [employer] of Bed: 3: Mod assist;With rail (therapist using pad) Transfers Sit to Stand: 2: Max assist;With upper extremity assist;From bed Stand to Sit: 2: Max assist;With upper extremity  assist;To chair/3-in-1 Details for Transfer Assistance: Pt with uncontrolled descend to chair. Pt with a posterior pelvic tilt and falling into chair. Pt sitting on geo mat for comfort placed in room by RN staff     Exercise     Balance Static Sitting Balance Static Sitting - Balance Support: Bilateral upper extremity supported;Feet supported Static Sitting - Level of Assistance: 2: Max assist (strong posterior lean) Static Sitting - Comment/# of Minutes: ~1 minute not tolerating well Static Standing Balance Static Standing - Balance Support: Bilateral upper extremity supported;During functional activity Static Standing - Level of Assistance: 5: Stand by assistance Static Standing - Comment/# of Minutes: pt is able to static stand with a widen base of support using RW   End of Session OT - End of Session Activity Tolerance: Patient limited by pain Patient left: in chair;with call bell/phone within reach Nurse Communication: Mobility status;Precautions  GO     Lucile Shutters 02/23/2013, 9:15 AM Pager: [phone]

## 2013-02-23 NOTE — Progress Notes (Signed)
Patient transferred to [facility] room 11.  Report called to RN on 6N and all questions answered patient transferred via wheelchair with RN and family aware of new room assignment.

## 2013-02-23 NOTE — Progress Notes (Signed)
Patient interviewed and examined, agree with  note above.  Mariella Saa MD, FACS  02/23/2013 11:30 AM

## 2013-02-23 NOTE — Consult Note (Signed)
[facility] Consult F/U Note Trevor Rodriguez TEAM 1 - Stepdown/ICU TEAM   Shedric Fredericks ZOX:[id number] DOB: 09/08/1941 DOA: 02/20/2013 PCP: Eino Farber, MD  Brief narrative: 72 yo male who presented with one week of worsening swelling and erythema in his suprapubic region. This apparently was drained in the office by his primary care physician, but the swelling and pain worsened and extended down into his scrotum and thigh.  Pt was taken to OR on 02/20/13 for debridement of lower abdominal wall, suprapubic region, scrotum and R thigh 2/2 worsening infection.   [facility] was asked to provide medical consultative care during his hospital stay.  Assessment/Plan:  Fournier's gangrene  Per primary service  HTN Well controlled at present   Hyperglycemia was not medicated at home and no known hx of DM - A1C qualifies for diagnosis of DM - educate pt - CBG currently well controlled  HLD cont home statin   Hx of severe systolic CHF TTE 8119 showed EF 10% w/ diffuse hypokinesis and restrictive physiology - continue home coreg and lasix in the setting of well compensated CHF - repeat TTE to assess current cardiac status - cardiac cath in 2011 failed to reveal signif CAD - add ACE as renal function is holding steady (dual indication w/ DM)  Ongoing tobacco abuse  Cessation counseled   BPH Continue home flomax dose  Morbid obesity Body mass index is 40.7  Code Status: FULL  Antibiotics: Zosyn Vancomycin  DVT prophylaxis: Lovenox  HPI/Subjective: The patient is sitting up in a bedside chair today.  He is alert and in good spirits.  He denies uncontrolled pain.  He denies fevers chills nausea or vomiting.  He states diabetes does run in his family and he is not surprised at this new diagnosis.  He states he is motivated to learn more about this disease in his treatment.  Objective: Blood pressure 124/64, pulse 77, temperature 98.5 F (36.9 C), temperature source Oral, resp.  rate 20, height 5\' 6"  (1.676 m), weight 111.4 kg (245 lb 9.5 oz), SpO2 95.00%.  Intake/Output Summary (Last 24 hours) at 02/23/13 1358 Last data filed at 02/23/13 1100  Gross per 24 hour  Intake   1820 ml  Output   3900 ml  Net  -2080 ml   Exam: General: No acute respiratory distress Lungs: Clear to auscultation bilaterally without wheezes or crackles Cardiovascular: Regular rate and rhythm without murmur gallop or rub  Abdomen: Nontender, nondistended, soft, bowel sounds positive, no rebound, no ascites, no appreciable mass Extremities: No significant cyanosis, clubbing, or edema bilateral lower extremities  Data Reviewed: Basic Metabolic Panel:  Recent Labs Lab 02/20/13 0150 02/20/13 1115 02/21/13 1128 02/22/13 0321 02/23/13 0447  NA 127*  --  134* 134* 133*  K 3.3*  --  3.7 4.0 4.5  CL 93*  --  99 99 100  CO2 23  --  25 25 25   GLUCOSE 203*  --  97 107* 130*  BUN 28*  --  18 15 11   CREATININE 1.35 1.51* 1.07 0.99 0.85  CALCIUM 8.8  --  8.4 8.5 8.7   CBC:  Recent Labs Lab 02/20/13 0150 02/20/13 1115 02/21/13 1128 02/22/13 0321 02/23/13 0447  WBC 14.7* 17.4* 17.0* 16.1* 14.0*  NEUTROABS 12.2*  --   --   --   --   HGB 11.2* 11.5* 10.4* 10.5* 10.5*  HCT 31.5* 32.9* 29.5* 30.3* 30.2*  MCV 85.4 85.7 85.3 86.3 84.8  PLT 244 222 269 300 323  Cardiac Enzymes:  Recent Labs Lab 02/20/13 0738  TROPONINI <0.30   CBG:  Recent Labs Lab 02/22/13 1153 02/22/13 1705 02/22/13 2121 02/23/13 0726 02/23/13 1114  GLUCAP 132* 171* 118* 122* 140*    Recent Results (from the past 240 hour(s))  ANAEROBIC CULTURE     Status: None   Collection Time    02/20/13  8:00 AM      Result Value Range Status   Specimen Description ABSCESS RIGHT LEG   Final   Special Requests PATIENT ON FOLLOWING ZOSYN VANCOMYCIN   Final   Gram Stain     Final   Value: FEW WBC PRESENT,BOTH PMN AND MONONUCLEAR     RARE SQUAMOUS EPITHELIAL CELLS PRESENT     FEW GRAM POSITIVE COCCI IN  CLUSTERS     IN PAIRS   Culture     Final   Value: NO ANAEROBES ISOLATED; CULTURE IN PROGRESS FOR 5 DAYS   Report Status PENDING   Incomplete  CULTURE, ROUTINE-ABSCESS     Status: None   Collection Time    02/20/13  8:00 AM      Result Value Range Status   Specimen Description ABSCESS RIGHT LEG   Final   Special Requests PATIENT ON FOLLOWING ZOSYN VANCOMYCIN   Final   Gram Stain     Final   Value: FEW WBC PRESENT,BOTH PMN AND MONONUCLEAR     RARE SQUAMOUS EPITHELIAL CELLS PRESENT     FEW GRAM POSITIVE COCCI IN CLUSTERS     IN PAIRS   Culture FEW ENTEROCOCCUS SPECIES   Final   Report Status 02/23/2013 FINAL   Final   Organism ID, Bacteria ENTEROCOCCUS SPECIES   Final  MRSA PCR SCREENING     Status: None   Collection Time    02/20/13  2:24 PM      Result Value Range Status   MRSA by PCR NEGATIVE  NEGATIVE Final   Comment:            The GeneXpert MRSA Assay (FDA     approved for NASAL specimens     only), is one component of a     comprehensive MRSA colonization     surveillance program. It is not     intended to diagnose MRSA     infection nor to guide or     monitor treatment for     MRSA infections.     Studies:  Recent x-ray studies have been reviewed in detail by the Attending Physician  Scheduled Meds:  Reviewed in detail by the Attending Physician   [facility] T  [facility] Office  [phone] Pager - Text Page per Amion as per below:  On-Call/Text Page:      [email or web address]      password [email or web address]  If 7PM-7AM, please contact night-coverage [email or web address] Password [email or web address] 02/23/2013, 1:58 PM   LOS: 3 days

## 2013-02-24 LAB — BASIC METABOLIC PANEL
Calcium: 9.1 mg/dL (ref 8.4–10.5)
Creatinine, Ser: 0.78 mg/dL (ref 0.50–1.35)
GFR calc non Af Amer: 88 mL/min — ABNORMAL LOW (ref >90)
Sodium: 133 mEq/L — ABNORMAL LOW (ref 135–145)

## 2013-02-24 LAB — CBC
MCH: 29.3 pg (ref 26.0–34.0)
MCV: 86 fL (ref 78.0–100.0)
Platelets: 312 10*3/uL (ref 150–400)
RBC: 3.65 MIL/uL — ABNORMAL LOW (ref 4.22–5.81)
RDW: 14.7 % (ref 11.5–15.5)
WBC: 13 10*3/uL — ABNORMAL HIGH (ref 4.0–10.5)

## 2013-02-24 LAB — GLUCOSE, CAPILLARY: Glucose-Capillary: 132 mg/dL — ABNORMAL HIGH (ref 70–99)

## 2013-02-24 MED ORDER — ASPIRIN 325 MG PO TABS
325.0000 mg | ORAL_TABLET | Freq: Every day | ORAL | Status: DC
  Administered 2013-02-24 – 2013-03-02 (×7): 325 mg via ORAL
  Filled 2013-02-24 (×9): qty 1

## 2013-02-24 NOTE — Progress Notes (Addendum)
Physical Therapy Treatment Patient Details Name: Trevor Rodriguez MRN: [id number] DOB: 05-07-1941 Today's Date: 02/24/2013 Time:  -     PT Assessment / Plan / Recommendation Comments on Treatment Session Pt making progress towards PT goals. Will continue to see as indicated.           02/24/13 1100  PT Visit Information  Last PT Received On 02/24/13  Assistance Needed +1  PT Time Calculation  PT Start Time 1039  PT Stop Time 1103  PT Time Calculation (min) 24 min  Subjective Data  Subjective I am not in any pain right now  Patient Stated Goal to go home  Precautions  Precautions Fall  Restrictions  Weight Bearing Restrictions No  Cognition  Overall Cognitive Status Impaired  Area of Impairment Following commands;Problem solving  Arousal/Alertness Awake/alert  Orientation Level Appears intact for tasks assessed  Behavior During Session [facility] for tasks performed  Problem Solving Had a hard time understanding what we wanted him to do as far as bridging and then scooting his hips to the right of the bed. He has had a hard time understanding how to use the reacher and sock aid (may be because these are novel ways to him)  Bed Mobility  Bed Mobility Rolling Right;Right Sidelying to Sit;Supine to Sit  Rolling Right 4: Min guard  Right Sidelying to Sit 4: Min assist  Sitting - Scoot to Edge of Bed 4: Min guard  Details for Bed Mobility Assistance Max VCs for bridging to EOB and roll technique  Transfers  Transfers Sit to Stand;Stand to Sit  Sit to Stand 4: Min guard  Stand to Sit 4: Min guard  Details for Transfer Assistance More time needed secondary to groin/scrotum swelling  Ambulation/Gait  Ambulation/Gait Assistance 4: Min guard  Ambulation Distance (Feet) 50 Feet  Assistive device Other (Comment) (IV pole; will benefit from use of rw)  Gait Pattern Step-to pattern;Wide base of support;Trunk flexed  Gait velocity less than 1.8 ft/sec putting him at risk for recurrent  falls  General Gait Details verbal cues for upright posture.    Stairs No  PT - End of Session  Equipment Utilized During Treatment Gait belt  Activity Tolerance Patient tolerated treatment well  Patient left in chair;with call bell/phone within reach  Nurse Communication Mobility status  PT - Assessment/Plan  Comments on Treatment Session Pt making progress towards PT goals. Will continue to see as indicated.  PT Plan Discharge plan remains appropriate  PT Frequency Min 3X/week  Follow Up Recommendations Home health PT;Supervision/Assistance - 24 hour  PT equipment Rolling walker with 5" wheels  Acute Rehab PT Goals  PT Goal: Supine/Side to Sit - Progress Progressing toward goal  PT Goal: Sit to Stand - Progress Progressing toward goal  PT Goal: Stand to Sit - Progress Progressing toward goal  PT Transfer Goal: Bed to Chair/Chair to Bed - Progress Progressing toward goal  PT Goal: Ambulate - Progress Progressing toward goal  PT General Charges  $$ ACUTE PT VISIT 1 Procedure  PT Treatments  $Gait Training 8-22 mins  $Therapeutic Activity 8-22 mins

## 2013-02-24 NOTE — Progress Notes (Signed)
Patient interviewed and examined, agree with PA note above.  Mariella Saa MD, FACS  02/24/2013 12:19 PM

## 2013-02-24 NOTE — Progress Notes (Signed)
4 Days Post-Op  Subjective: He does not complain even during the packing of the wound.  Objective: Vital signs in last 24 hours: Temp:  [98.4 F (36.9 C)-99.3 F (37.4 C)] 98.6 F (37 C) (03/06 0532) Pulse Rate:  [71-77] 75 (03/06 0532) Resp:  [15-20] 18 (03/06 0532) BP: (124-141)/(62-72) 134/70 mmHg (03/06 0532) SpO2:  [95 %-99 %] 97 % (03/06 0532) Last BM Date: 02/20/13 TM 99.3, VSS, WBC slowly coming down,   Intake/Output from previous day: 03/05 0701 - 03/06 0700 In: 930 [P.O.:150; I.V.:280; IV Piggyback:500] Out: 4250 [Urine:4250] Intake/Output this shift:    Skin: Open wound perineium is still soupy.  the undermined area on the right goes to 8cm,  the open area on the left is about 5 cm deep, but the induration present is about 11 cm long, and still present.  No significant fluctulance, or erythema.  The Right thigh adjacent to the scrotum, is about 5 cm deep, with some fluctulence, no erythema,present.    Lab Results:   Recent Labs  02/23/13 0447 02/24/13 0645  WBC 14.0* 13.0*  HGB 10.5* 10.7*  HCT 30.2* 31.4*  PLT 323 312    BMET  Recent Labs  02/23/13 0447 02/24/13 0645  NA 133* 133*  K 4.5 4.7  CL 100 100  CO2 25 24  GLUCOSE 130* 112*  BUN 11 9  CREATININE 0.85 0.78  CALCIUM 8.7 9.1   PT/INR No results found for this basename: LABPROT, INR,  in the last 72 hours  No results found for this basename: AST, ALT, ALKPHOS, BILITOT, PROT, ALBUMIN,  in the last 168 hours   Lipase  No results found for this basename: lipase     Studies/Results: No results found.  Medications: . carvedilol  6.25 mg Oral BID WC  . enoxaparin  40 mg Subcutaneous Q24H  . insulin aspart  0-15 Units Subcutaneous TID WC  . insulin aspart  0-5 Units Subcutaneous QHS  . insulin aspart  4 Units Subcutaneous TID WC  . lisinopril  2.5 mg Oral Daily  . piperacillin-tazobactam (ZOSYN)  IV  3.375 g Intravenous Q8H  . simvastatin  40 mg Oral q1800  . tamsulosin  0.4 mg Oral  QPC supper  . vancomycin  1,000 mg Intravenous Q12H   Prior to Admission medications   Medication Sig Start Date End Date Taking? Authorizing Provider  aspirin 325 MG tablet Take 325 mg by mouth daily.   Yes Historical Provider, MD  carvedilol (COREG) 6.25 MG tablet TAKE ONE TABLET BY MOUTH TWICE DAILY WITH MEALS 10/04/11  Yes Vesta Mixer, MD  furosemide (LASIX) 20 MG tablet TAKE ONE TABLET BY MOUTH EVERY DAY 10/04/11  Yes Vesta Mixer, MD  simvastatin (ZOCOR) 40 MG tablet TAKE ONE TABLET BY MOUTH EVERY DAY 11/05/11  Yes Vesta Mixer, MD  Tamsulosin HCl (FLOMAX) 0.4 MG CAPS TAKE ONE CAPSULE BY MOUTH EVERY DAY 10/04/11  Yes Vesta Mixer, MD     Assessment/Plan necrotizing infection of the suprapubic region, right groin, right thigh, and right hemiscrotum; s/p debridement of necrotizing infection of the suprapubic region, right groin, right thigh, and right hemiscrotum  Surgeon: TSUEI,MATTHEW K.,02/20/2013.  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 No hx of MI. He is able to work and daily activities do not seem impaired. (Current 2D shows EF 25-30%) Hypertension  Hyperlipidemia  Body mass index is 40.7  Hx of ETOH and tobacco abuse.   Plan:  The whole area is still soupy with allot of swelling , the odor is better.  The ABD was soaked thru with drainage.  Culture shows some enterococcus.  I had the nursing staff come in and I showed them the sites and depth of the wounds.  I will start BID dressing changes and PRN.  He still has purulence from all the sites.  Continue antibiotics. He is back on his PO med, I restarted ASA. Appreciate Dr. Vassie Moment help.  LOS: 4 days    [name],[name] 02/24/2013

## 2013-02-24 NOTE — Progress Notes (Signed)
Occupational Therapy Treatment Patient Details Name: Demarco Bacci MRN: [id number] DOB: 08-Sep-1941 Today's Date: 02/24/2013 Time: 4098-1191 OT Time Calculation (min): 40 min  OT Assessment / Plan / Recommendation Comments on Treatment Session This 72 yo male making progress albeit slowly. Will continue to progress from acute OT with follow up HHOT (may need SNF)    Follow Up Recommendations  Home health OT (if continues to progress slowly may need SNF)       Equipment Recommendations  3 in 1 bedside comode       Frequency Min 2X/week   Plan Discharge plan remains appropriate    Precautions / Restrictions Precautions Precautions: Fall Precaution Comments: scrotal edema Restrictions Weight Bearing Restrictions: No       ADL  Lower Body Dressing: Performed;Moderate assistance (with AE) Where Assessed - Lower Body Dressing: Supported sitting Toilet Transfer: Simulated;Minimal assistance Toilet Transfer Method: Sit to stand Toilet Transfer Equipment:  (Bed, out door, sit in chair behind him) Toileting - Clothing Manipulation and Hygiene: Performed;+1 Total assistance (mesh underwear to hold bandages in place) Where Assessed - Toileting Clothing Manipulation and Hygiene: Supine, head of bed flat;Standing (bridging) Transfers/Ambulation Related to ADLs: Min A for all ADL Comments: Pt has a hard time seeing his feet based off of the slanted way he has to sit in the recliner and/or bed due to painful/swolledn genitals so AE is taking him longer to figure out. Will need to try reacher and a wide sock aid next time     OT Goals ADL Goals ADL Goal: Lower Body Dressing - Progress: Progressing toward goals ADL Goal: Toilet Transfer - Progress: Progressing toward goals Miscellaneous OT Goals OT Goal: Miscellaneous Goal #1 - Progress: Progressing toward goals  Visit Information  Last OT Received On: 02/24/13 Assistance Needed: +1 PT/OT Co-Evaluation/Treatment: Yes (partial)           Cognition  Cognition Overall Cognitive Status: Impaired Area of Impairment: Following commands;Problem solving Arousal/Alertness: Awake/alert Orientation Level: Appears intact for tasks assessed Behavior During Session: [facility] for tasks performed Problem Solving: Had a hard time understanding what we wanted him to do as far as bridging and then scooting his hips to the right of the bed. He has had a hard time understanding how to use the reacher and sock aid (may be because these are novel ways to him)    Mobility  Bed Mobility Bed Mobility: Rolling Right;Right Sidelying to Sit;Supine to Sit Rolling Right: 4: Min guard Right Sidelying to Sit: 4: Min assist Sitting - Scoot to Edge of Bed: 4: Min guard Details for Bed Mobility Assistance: Max VCs for bridging to EOB and roll technique Transfers Sit to Stand: 4: Min guard Stand to Sit: 4: Min guard Details for Transfer Assistance: More time needed secondary to groin/scrotum swelling          End of Session OT - End of Session Equipment Utilized During Treatment: Gait belt (RW, AE) Activity Tolerance:  (limited by uncomfortable in groin area) Patient left: in chair;with call bell/phone within reach Nurse Communication:  (NT: Mesh underwear and need to cut it to fit)       Evette Georges [phone] 02/24/2013, 11:35 AM

## 2013-02-24 NOTE — Progress Notes (Signed)
[facility] CONSULT PROGRESS NOTE  Trevor Rodriguez YNW:[id number] DOB: 09/04/1941 DOA: 02/20/2013 PCP: Eino Farber, MD  Brief narrative:  72 yo male who presented with one week of worsening swelling and erythema in his suprapubic region. This apparently was drained in the office by his primary care physician, but the swelling and pain worsened and extended down into his scrotum and thigh.  Pt was taken to OR on 02/20/13 for debridement of lower abdominal wall, suprapubic region, scrotum and R thigh 2/2 worsening infection.  [facility] was asked to provide medical consultative care during his hospital stay.  Assessment/Plan: Fournier's gangrene  Per primary service.  HTN  Stable.  Hyperglycemia  Was not medicated at home and no known hx of DM.  Hemoglobin A1c 6.5, qualifies for diagnosis of DM. Educate patient, CBG currently well controlled, transitioned to metformin at the time of discharge given normal renal function.  HLD  Continue home statin   Hx of severe systolic CHF  2-D echocardiogram on 02/21/2013 showed EF of 25-30%, diffuse hypokinesis.  Improved from previous 2-D echocardiogram. Continue home coreg and lasix in the setting of well compensated CHF. Continue ACE as renal function is holding steady (dual indication w/ DM).  Ongoing tobacco abuse  Cessation counseled   BPH  Continue home flomax dose   Morbid obesity  Body mass index is 40.7   Prophylaxis Lovenox.  Code Status: Full code. Family Communication: No family present. Disposition Plan: Per primary service.  Procedures:  As above.  Antibiotics:  Vancomycin 02/20/2013 >>  Zosyn 02/20/2013 >>  HPI/Subjective: Patient denies any chest pain or shortness of breath.  No specific concerns.  Feeling better.  He was seated in a chair.  Objective: Filed Vitals:   02/23/13 1600 02/23/13 2149 02/24/13 0532 02/24/13 1411  BP: 128/62 141/72 134/70 113/49  Pulse: 71 75 75 75  Temp: 99.3 F (37.4 C)  98.4 F (36.9 C) 98.6 F (37 C) 97.2 F (36.2 C)  TempSrc: Oral Oral Oral Oral  Resp: 15 16 18 19   Height:      Weight:      SpO2: 99% 96% 97% 99%    Intake/Output Summary (Last 24 hours) at 02/24/13 1503 Last data filed at 02/24/13 0700  Gross per 24 hour  Intake    380 ml  Output   3100 ml  Net  -2720 ml   Filed Weights   02/21/13 0315 02/22/13 0354 02/23/13 0400  Weight: 114.5 kg (252 lb 6.8 oz) 113 kg (249 lb 1.9 oz) 111.4 kg (245 lb 9.5 oz)    Exam: Physical Exam: General: Awake, Oriented, No acute distress. HEENT: EOMI. Neck: Supple CV: S1 and S2 Lungs: Clear to ascultation bilaterally Abdomen: Soft, Nontender, Nondistended, +bowel sounds. Ext: Good pulses. Trace edema.  Data Reviewed: Basic Metabolic Panel:  Recent Labs Lab 02/20/13 0150 02/20/13 1115 02/21/13 1128 02/22/13 0321 02/23/13 0447 02/24/13 0645  NA 127*  --  134* 134* 133* 133*  K 3.3*  --  3.7 4.0 4.5 4.7  CL 93*  --  99 99 100 100  CO2 23  --  25 25 25 24   GLUCOSE 203*  --  97 107* 130* 112*  BUN 28*  --  18 15 11 9   CREATININE 1.35 1.51* 1.07 0.99 0.85 0.78  CALCIUM 8.8  --  8.4 8.5 8.7 9.1   Liver Function Tests: No results found for this basename: AST, ALT, ALKPHOS, BILITOT, PROT, ALBUMIN,  in the last 168 hours No results  found for this basename: LIPASE, AMYLASE,  in the last 168 hours No results found for this basename: AMMONIA,  in the last 168 hours CBC:  Recent Labs Lab 02/20/13 0150 02/20/13 1115 02/21/13 1128 02/22/13 0321 02/23/13 0447 02/24/13 0645  WBC 14.7* 17.4* 17.0* 16.1* 14.0* 13.0*  NEUTROABS 12.2*  --   --   --   --   --   HGB 11.2* 11.5* 10.4* 10.5* 10.5* 10.7*  HCT 31.5* 32.9* 29.5* 30.3* 30.2* 31.4*  MCV 85.4 85.7 85.3 86.3 84.8 86.0  PLT 244 222 269 300 323 312   Cardiac Enzymes:  Recent Labs Lab 02/20/13 0738  TROPONINI <0.30   BNP (last 3 results) No results found for this basename: PROBNP,  in the last 8760 hours CBG:  Recent Labs Lab  02/23/13 1114 02/23/13 1631 02/23/13 2124 02/24/13 0746 02/24/13 1150  GLUCAP 140* 107* 98 90 132*    Recent Results (from the past 240 hour(s))  ANAEROBIC CULTURE     Status: None   Collection Time    02/20/13  8:00 AM      Result Value Range Status   Specimen Description ABSCESS RIGHT LEG   Final   Special Requests PATIENT ON FOLLOWING ZOSYN VANCOMYCIN   Final   Gram Stain     Final   Value: FEW WBC PRESENT,BOTH PMN AND MONONUCLEAR     RARE SQUAMOUS EPITHELIAL CELLS PRESENT     FEW GRAM POSITIVE COCCI IN CLUSTERS     IN PAIRS   Culture     Final   Value: NO ANAEROBES ISOLATED; CULTURE IN PROGRESS FOR 5 DAYS   Report Status PENDING   Incomplete  CULTURE, ROUTINE-ABSCESS     Status: None   Collection Time    02/20/13  8:00 AM      Result Value Range Status   Specimen Description ABSCESS RIGHT LEG   Final   Special Requests PATIENT ON FOLLOWING ZOSYN VANCOMYCIN   Final   Gram Stain     Final   Value: FEW WBC PRESENT,BOTH PMN AND MONONUCLEAR     RARE SQUAMOUS EPITHELIAL CELLS PRESENT     FEW GRAM POSITIVE COCCI IN CLUSTERS     IN PAIRS   Culture FEW ENTEROCOCCUS SPECIES   Final   Report Status 02/23/2013 FINAL   Final   Organism ID, Bacteria ENTEROCOCCUS SPECIES   Final  MRSA PCR SCREENING     Status: None   Collection Time    02/20/13  2:24 PM      Result Value Range Status   MRSA by PCR NEGATIVE  NEGATIVE Final   Comment:            The GeneXpert MRSA Assay (FDA     approved for NASAL specimens     only), is one component of a     comprehensive MRSA colonization     surveillance program. It is not     intended to diagnose MRSA     infection nor to guide or     monitor treatment for     MRSA infections.     Studies: No results found.  Scheduled Meds: . aspirin  325 mg Oral Daily  . carvedilol  6.25 mg Oral BID WC  . enoxaparin  40 mg Subcutaneous Q24H  . insulin aspart  0-15 Units Subcutaneous TID WC  . insulin aspart  0-5 Units Subcutaneous QHS  .  insulin aspart  4 Units Subcutaneous TID WC  . lisinopril  2.5 mg Oral Daily  . piperacillin-tazobactam (ZOSYN)  IV  3.375 g Intravenous Q8H  . simvastatin  40 mg Oral q1800  . tamsulosin  0.4 mg Oral QPC supper  . vancomycin  1,000 mg Intravenous Q12H   Continuous Infusions: . 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 If 7PM-7AM, please contact night-coverage at [email or web address], password [facility] 02/24/2013, 3:03 PM  LOS: 4 days

## 2013-02-24 NOTE — Progress Notes (Signed)
ANTIBIOTIC CONSULT NOTE - FOLLOW UP  Pharmacy Consult:  Vancomycin / Zosyn Indication: Enterococcus infection of the suprapubic region  Allergies  Allergen Reactions  . Ibuprofen Nausea Only    Patient Measurements: Height: 5\' 6"  (167.6 cm) Weight: 245 lb 9.5 oz (111.4 kg) IBW/kg (Calculated) : 63.8  Vital Signs: Temp: 98.6 F (37 C) (03/06 0532) Temp src: Oral (03/06 0532) BP: 134/70 mmHg (03/06 0532) Pulse Rate: 75 (03/06 0532) Intake/Output from previous day: 03/05 0701 - 03/06 0700 In: 930 [P.O.:150; I.V.:280; IV Piggyback:500] Out: 4250 [Urine:4250] Intake/Output from this shift:    Labs:  Recent Labs  02/22/13 0321 02/23/13 0447 02/24/13 0645  WBC 16.1* 14.0* 13.0*  HGB 10.5* 10.5* 10.7*  PLT 300 323 312  CREATININE 0.99 0.85 0.78   Estimated Creatinine Clearance: 97.8 ml/min (by C-G formula based on Cr of 0.78). No results found for this basename: VANCOTROUGH, VANCOPEAK, VANCORANDOM, GENTTROUGH, GENTPEAK, GENTRANDOM, TOBRATROUGH, TOBRAPEAK, TOBRARND, AMIKACINPEAK, AMIKACINTROU, AMIKACIN,  in the last 72 hours   Microbiology: Recent Results (from the past 720 hour(s))  ANAEROBIC CULTURE     Status: None   Collection Time    02/20/13  8:00 AM      Result Value Range Status   Specimen Description ABSCESS RIGHT LEG   Final   Special Requests PATIENT ON FOLLOWING ZOSYN VANCOMYCIN   Final   Gram Stain     Final   Value: FEW WBC PRESENT,BOTH PMN AND MONONUCLEAR     RARE SQUAMOUS EPITHELIAL CELLS PRESENT     FEW GRAM POSITIVE COCCI IN CLUSTERS     IN PAIRS   Culture     Final   Value: NO ANAEROBES ISOLATED; CULTURE IN PROGRESS FOR 5 DAYS   Report Status PENDING   Incomplete  CULTURE, ROUTINE-ABSCESS     Status: None   Collection Time    02/20/13  8:00 AM      Result Value Range Status   Specimen Description ABSCESS RIGHT LEG   Final   Special Requests PATIENT ON FOLLOWING ZOSYN VANCOMYCIN   Final   Gram Stain     Final   Value: FEW WBC PRESENT,BOTH PMN  AND MONONUCLEAR     RARE SQUAMOUS EPITHELIAL CELLS PRESENT     FEW GRAM POSITIVE COCCI IN CLUSTERS     IN PAIRS   Culture FEW ENTEROCOCCUS SPECIES   Final   Report Status 02/23/2013 FINAL   Final   Organism ID, Bacteria ENTEROCOCCUS SPECIES   Final  MRSA PCR SCREENING     Status: None   Collection Time    02/20/13  2:24 PM      Result Value Range Status   MRSA by PCR NEGATIVE  NEGATIVE Final   Comment:            The GeneXpert MRSA Assay (FDA     approved for NASAL specimens     only), is one component of a     comprehensive MRSA colonization     surveillance program. It is not     intended to diagnose MRSA     infection nor to guide or     monitor treatment for     MRSA infections.          Assessment: 41 YOM presented with pelvic area induration, erythema, and purulent drainage.  Now s/p debridement of necrotizing infection of the suprapubic region on 3/2. Pt continues on vancomycin and Zosyn (Day #5).  Patient's renal function stable. Noted CCS PA note states  wound still with purulent drainage. WBC decreasing. Tm 99.6.  Vanc 3/2>> Zosyn 3/2>>  3/2 right leg abscess cx - Few Enterococcus, Amp/Vanc sens  Goal of Therapy:  Vanc trough ~15 mcg/mL  Plan:  - Continue vanc 1gm IV Q12H - Continue Zosyn 3.375gm IV Q8H, 4 hr infusion - Monitor renal fxn, clinical course - Consider d/c Vancomycin - if not d/c, will check trough tomorrow. - Consider de-escalate Zosyn to Unasyn or Augmentin according to culture results. - Consider increasing Lovenox to 55mg  SQ Q24H for VTE px in patients with BMI > 30  Christoper Fabian, PharmD, BCPS Clinical pharmacist, pager [phone] 02/24/2013, 9:23 AM

## 2013-02-25 LAB — BASIC METABOLIC PANEL
BUN: 11 mg/dL (ref 6–23)
CO2: 25 mEq/L (ref 19–32)
Calcium: 9.1 mg/dL (ref 8.4–10.5)
Chloride: 98 mEq/L (ref 96–112)
Creatinine, Ser: 0.81 mg/dL (ref 0.50–1.35)

## 2013-02-25 LAB — CBC
HCT: 31.1 % — ABNORMAL LOW (ref 39.0–52.0)
MCH: 30.3 pg (ref 26.0–34.0)
MCHC: 34.4 g/dL (ref 30.0–36.0)
MCV: 88.1 fL (ref 78.0–100.0)
Platelets: 337 10*3/uL (ref 150–400)
RDW: 14.6 % (ref 11.5–15.5)
WBC: 13 10*3/uL — ABNORMAL HIGH (ref 4.0–10.5)

## 2013-02-25 LAB — GLUCOSE, CAPILLARY: Glucose-Capillary: 97 mg/dL (ref 70–99)

## 2013-02-25 NOTE — Progress Notes (Signed)
Patient states his last bowel movement was 02/21/13.  Positive bowel sounds, patient denies discomfort of feelings of constipation.  Patient requests a laxative or stool softener for tomorrow.  Will continue to monitor.

## 2013-02-25 NOTE — Progress Notes (Signed)
[facility] CONSULT PROGRESS NOTE  Samul Mcinroy WUJ:[id number] DOB: [birth year] DOA: 02/20/2013 PCP: Eino Farber, MD  Brief narrative:  72 yo male who presented with one week of worsening swelling and erythema in his suprapubic region. This apparently was drained in the office by his primary care physician, but the swelling and pain worsened and extended down into his scrotum and thigh.  Pt was taken to OR on 02/20/13 for debridement of lower abdominal wall, suprapubic region, scrotum and R thigh 2/2 worsening infection.  [facility] was asked to provide medical consultative care during his hospital stay.  Assessment/Plan: Fournier's gangrene  Per primary service.  HTN  Stable.  New diagnosis of type 2 diabetes Was not medicated at home and no known hx of DM.  Hemoglobin A1c 6.5, qualifies for diagnosis of DM. Educate patient, CBG currently well controlled, transitioned to metformin at the time of discharge given normal renal function.  HLD  Continue home statin   Hx of severe systolic CHF  2-D echocardiogram on 02/21/2013 showed EF of 25-30%, diffuse hypokinesis.  Improved from previous 2-D echocardiogram. Continue home coreg and lasix in the setting of well compensated CHF. Continue ACE as renal function is holding steady (dual indication w/ DM).  Ongoing tobacco abuse  Cessation counseled   BPH  Continue home flomax dose   Morbid obesity  Body mass index is 40.7   Prophylaxis Lovenox.  Code Status: Full code. Family Communication: No family present. Disposition Plan: Per primary service.  Procedures:  As above.  Antibiotics:  Vancomycin 02/20/2013 >>  Zosyn 02/20/2013 >>  HPI/Subjective: No specific concerns, surgical pain improving.  Objective: Filed Vitals:   02/24/13 1411 02/24/13 2143 02/25/13 0547 02/25/13 1041  BP: 113/49 121/60 127/71 136/58  Pulse: 75 80 80   Temp: 97.2 F (36.2 C) 97.4 F (36.3 C) 97.9 F (36.6 C)   TempSrc: Oral Axillary  Oral   Resp: 19 18 16    Height:      Weight:   112.4 kg (247 lb 12.8 oz)   SpO2: 99% 100% 99%     Intake/Output Summary (Last 24 hours) at 02/25/13 1302 Last data filed at 02/25/13 1100  Gross per 24 hour  Intake 1103.17 ml  Output   2475 ml  Net -1371.83 ml   Filed Weights   02/22/13 0354 02/23/13 0400 02/25/13 0547  Weight: 113 kg (249 lb 1.9 oz) 111.4 kg (245 lb 9.5 oz) 112.4 kg (247 lb 12.8 oz)    Exam: Physical Exam: General: Awake, Oriented, No acute distress. HEENT: EOMI. Neck: Supple CV: S1 and S2 Lungs: Clear to ascultation bilaterally Abdomen: Soft, Nontender, Nondistended, +bowel sounds. Ext: Good pulses. Trace edema.  Data Reviewed: Basic Metabolic Panel:  Recent Labs Lab 02/21/13 1128 02/22/13 0321 02/23/13 0447 02/24/13 0645 02/25/13 0435  NA 134* 134* 133* 133* 132*  K 3.7 4.0 4.5 4.7 4.7  CL 99 99 100 100 98  CO2 25 25 25 24 25   GLUCOSE 97 107* 130* 112* 145*  BUN 18 15 11 9 11   CREATININE 1.07 0.99 0.85 0.78 0.81  CALCIUM 8.4 8.5 8.7 9.1 9.1   Liver Function Tests: No results found for this basename: AST, ALT, ALKPHOS, BILITOT, PROT, ALBUMIN,  in the last 168 hours No results found for this basename: LIPASE, AMYLASE,  in the last 168 hours No results found for this basename: AMMONIA,  in the last 168 hours CBC:  Recent Labs Lab 02/20/13 0150  02/21/13 1128 02/22/13 0321 02/23/13 0447  02/24/13 0645 02/25/13 0435  WBC 14.7*  < > 17.0* 16.1* 14.0* 13.0* 13.0*  NEUTROABS 12.2*  --   --   --   --   --   --   HGB 11.2*  < > 10.4* 10.5* 10.5* 10.7* 10.7*  HCT 31.5*  < > 29.5* 30.3* 30.2* 31.4* 31.1*  MCV 85.4  < > 85.3 86.3 84.8 86.0 88.1  PLT 244  < > 269 300 323 312 337  < > = values in this interval not displayed. Cardiac Enzymes:  Recent Labs Lab 02/20/13 0738  TROPONINI <0.30   BNP (last 3 results) No results found for this basename: PROBNP,  in the last 8760 hours CBG:  Recent Labs Lab 02/24/13 1150 02/24/13 1657  02/24/13 2115 02/25/13 0823 02/25/13 1248  GLUCAP 132* 121* 90 97 102*    Recent Results (from the past 240 hour(s))  ANAEROBIC CULTURE     Status: None   Collection Time    02/20/13  8:00 AM      Result Value Range Status   Specimen Description ABSCESS RIGHT LEG   Final   Special Requests PATIENT ON FOLLOWING ZOSYN VANCOMYCIN   Final   Gram Stain     Final   Value: FEW WBC PRESENT,BOTH PMN AND MONONUCLEAR     RARE SQUAMOUS EPITHELIAL CELLS PRESENT     FEW GRAM POSITIVE COCCI IN CLUSTERS     IN PAIRS   Culture     Final   Value: NO ANAEROBES ISOLATED; CULTURE IN PROGRESS FOR 5 DAYS   Report Status PENDING   Incomplete  CULTURE, ROUTINE-ABSCESS     Status: None   Collection Time    02/20/13  8:00 AM      Result Value Range Status   Specimen Description ABSCESS RIGHT LEG   Final   Special Requests PATIENT ON FOLLOWING ZOSYN VANCOMYCIN   Final   Gram Stain     Final   Value: FEW WBC PRESENT,BOTH PMN AND MONONUCLEAR     RARE SQUAMOUS EPITHELIAL CELLS PRESENT     FEW GRAM POSITIVE COCCI IN CLUSTERS     IN PAIRS   Culture FEW ENTEROCOCCUS SPECIES   Final   Report Status 02/23/2013 FINAL   Final   Organism ID, Bacteria ENTEROCOCCUS SPECIES   Final  MRSA PCR SCREENING     Status: None   Collection Time    02/20/13  2:24 PM      Result Value Range Status   MRSA by PCR NEGATIVE  NEGATIVE Final   Comment:            The GeneXpert MRSA Assay (FDA     approved for NASAL specimens     only), is one component of a     comprehensive MRSA colonization     surveillance program. It is not     intended to diagnose MRSA     infection nor to guide or     monitor treatment for     MRSA infections.     Studies: No results found.  Scheduled Meds: . aspirin  325 mg Oral Daily  . carvedilol  6.25 mg Oral BID WC  . enoxaparin  40 mg Subcutaneous Q24H  . insulin aspart  0-15 Units Subcutaneous TID WC  . insulin aspart  0-5 Units Subcutaneous QHS  . insulin aspart  4 Units  Subcutaneous TID WC  . lisinopril  2.5 mg Oral Daily  . piperacillin-tazobactam (ZOSYN)  IV  3.375  g Intravenous Q8H  . simvastatin  40 mg Oral q1800  . tamsulosin  0.4 mg Oral QPC supper  . vancomycin  1,000 mg Intravenous Q12H   Continuous Infusions: . 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 If 7PM-7AM, please contact night-coverage at [email or web address], password [facility] 02/25/2013, 1:02 PM  LOS: 5 days

## 2013-02-25 NOTE — Progress Notes (Signed)
Occupational Therapy Treatment Patient Details Name: Trevor Rodriguez MRN: [id number] DOB: Apr 30, 1941 Today's Date: 02/25/2013 Time: 3086-5784 OT Time Calculation (min): 41 min  OT Assessment / Plan / Recommendation Comments on Treatment Session Making excellent progress. Pt will be able to D/C home with HHOT. Rec HH aide.  Will need 3 in 1  and AE kit for home. will continue to follow to faciliate safe D/C home.    Follow Up Recommendations  Home health OT  [facility] aide   Barriers to Discharge       Equipment Recommendations  3 in 1 bedside comode    Recommendations for Other Services  HH aide  Frequency Min 2X/week   Plan Discharge plan remains appropriate    Precautions / Restrictions Precautions Precautions: Fall Precaution Comments: scrotal edema Restrictions Weight Bearing Restrictions: No   Pertinent Vitals/Pain C/o min pain groin area    ADL  Toilet Transfer: Supervision/safety Toilet Transfer Method: Sit to Barista: Materials engineer and Hygiene: Set up Where Assessed - Engineer, mining and Hygiene: Sit to stand from 3-in-1 or toilet Transfers/Ambulation Related to ADLs: S RW level ADL Comments: Pt does well with AE. discussed method for hygiene after toileting. functional mobility for ADL @ S level    OT Diagnosis:    OT Problem List:   OT Treatment Interventions:     OT Goals Acute Rehab OT Goals OT Goal Formulation: With patient Time For Goal Achievement: 03/09/13 Potential to Achieve Goals: Good ADL Goals Pt Will Perform Upper Body Bathing: with set-up;Sitting, chair;Supported ADL Goal: Product manager - Progress: Met Pt Will Perform Lower Body Bathing: with min assist;Sit to stand from chair;with adaptive equipment ADL Goal: Lower Body Bathing - Progress: Progressing toward goals Pt Will Perform Upper Body Dressing: with set-up;Sitting, chair;Supported ADL Goal: Patent attorney -  Progress: Met Pt Will Perform Lower Body Dressing: with min assist;Sit to stand from chair;with adaptive equipment ADL Goal: Lower Body Dressing - Progress: Progressing toward goals Pt Will Transfer to Toilet: with supervision;Ambulation;3-in-1 ADL Goal: Toilet Transfer - Progress: Met Pt Will Perform Toileting - Hygiene: with supervision;Sit to stand from 3-in-1/toilet ADL Goal: Toileting - Hygiene - Progress: Progressing toward goals Miscellaneous OT Goals Miscellaneous OT Goal #1: Pt will complete bed mobility at Supervision level as precursor to adls OT Goal: Miscellaneous Goal #1 - Progress: Progressing toward goals  Visit Information  Last OT Received On: 02/25/13 Assistance Needed: +1    Subjective Data      Prior Functioning       Cognition  Cognition Overall Cognitive Status: Appears within functional limits for tasks assessed/performed Arousal/Alertness: Awake/alert Orientation Level: Appears intact for tasks assessed Behavior During Session: [facility] for tasks performed Cognition - Other Comments: pt cognition seems good today.  pt seems simple, but no cognitive deficits.      Mobility  Bed Mobility Bed Mobility: Supine to Sit;Sit to Supine Rolling Right: 5: Supervision;With rail Right Sidelying to Sit: 5: Supervision;With rails;HOB elevated Supine to Sit: 5: Supervision Sitting - Scoot to Edge of Bed: 5: Supervision Sit to Supine: 4: Min assist;Other (comment) (wanted to get back into bed on R side. More difficult) Details for Bed Mobility Assistance: Needs to get in/out of bed on L side Transfers Transfers: Sit to Stand;Stand to Sit Sit to Stand: 5: Supervision;From bed Stand to Sit: 5: Supervision;To bed Details for Transfer Assistance: cues for UE use, controlling descent to bed.      Exercises  Balance Balance Balance Assessed: No   End of Session OT - End of Session Equipment Utilized During Treatment: Gait belt Activity Tolerance: Patient tolerated  treatment well Patient left: in bed;with call bell/phone within reach Nurse Communication: Mobility status;Other (comment) (need for pt to complete self care)  GO     WARD,HILLARY 02/25/2013, 1:09 PM [facility], OTR/L  [phone] 02/25/2013

## 2013-02-25 NOTE — Progress Notes (Signed)
5 Days Post-Op  Subjective: Doing somewhat better, still very painful and uncomfortable.  Still takes dressing change well.    Objective: Vital signs in last 24 hours: Temp:  [97.2 F (36.2 C)-97.9 F (36.6 C)] 97.9 F (36.6 C) (03/07 0547) Pulse Rate:  [75-80] 80 (03/07 0547) Resp:  [16-19] 16 (03/07 0547) BP: (113-136)/(49-71) 136/58 mmHg (03/07 1041) SpO2:  [99 %-100 %] 99 % (03/07 0547) Weight:  [247 lb 12.8 oz (112.4 kg)] 247 lb 12.8 oz (112.4 kg) (03/07 0547) Last BM Date: 02/19/13 (Per patient report)  Intake/Output from previous day: 03/06 0701 - 03/07 0700 In: 863.2 [I.V.:263.2; IV Piggyback:600] Out: 1525 [Urine:1525] Intake/Output this shift: Total I/O In: 240 [P.O.:240] Out: 950 [Urine:950]  General appearance: alert, cooperative and no distress Resp: clear to auscultation bilaterally and bs down some in base Skin: wound looks cleaner this AM, less purulence/drainage.  The base is still clean and pink, no necrosis.  The open areas being packed on both sides look better.  As does the lower site between scrotum and right thigh.  Penrose drains removed.  Lab Results:   Recent Labs  02/24/13 0645 02/25/13 0435  WBC 13.0* 13.0*  HGB 10.7* 10.7*  HCT 31.4* 31.1*  PLT 312 337    BMET  Recent Labs  02/24/13 0645 02/25/13 0435  NA 133* 132*  K 4.7 4.7  CL 100 98  CO2 24 25  GLUCOSE 112* 145*  BUN 9 11  CREATININE 0.78 0.81  CALCIUM 9.1 9.1   PT/INR No results found for this basename: LABPROT, INR,  in the last 72 hours  No results found for this basename: AST, ALT, ALKPHOS, BILITOT, PROT, ALBUMIN,  in the last 168 hours   Lipase  No results found for this basename: lipase     Studies/Results: No results found.  Medications: . aspirin  325 mg Oral Daily  . carvedilol  6.25 mg Oral BID WC  . enoxaparin  40 mg Subcutaneous Q24H  . insulin aspart  0-15 Units Subcutaneous TID WC  . insulin aspart  0-5 Units Subcutaneous QHS  . insulin aspart  4  Units Subcutaneous TID WC  . lisinopril  2.5 mg Oral Daily  . piperacillin-tazobactam (ZOSYN)  IV  3.375 g Intravenous Q8H  . simvastatin  40 mg Oral q1800  . tamsulosin  0.4 mg Oral QPC supper  . vancomycin  1,000 mg Intravenous Q12H    Assessment/Plan necrotizing infection of the suprapubic region, right groin, right thigh, and right hemiscrotum; s/p debridement of necrotizing infection of the suprapubic region, right groin, right thigh, and right hemiscrotum  Surgeon: TSUEI,MATTHEW K.,02/20/2013.  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 No hx of MI. He is able to work and daily activities do not seem impaired. (Current 2D shows EF 25-30%)  Hypertension  Hyperlipidemia  Body mass index is 40.7  Hx of ETOH and tobacco abuse.   Plan:  Dr. Johna Sheriff viewed all the sites and did dressing change with me.  We took out the penrose drains, the area on the left is less indurated and feels better to the patient.  Will continue bid dressing changes and d/c foley.    LOS: 5 days    Trevor Rodriguez,Trevor Rodriguez 02/25/2013

## 2013-02-25 NOTE — Progress Notes (Signed)
Patient interviewed and examined, agree with PA note above.  Mariella Saa MD, FACS  02/25/2013 3:45 PM

## 2013-02-25 NOTE — Progress Notes (Signed)
Physical Therapy Treatment Patient Details Name: Trevor Rodriguez MRN: [id number] DOB: 01-26-1941 Today's Date: 02/25/2013 Time: 4098-1191 PT Time Calculation (min): 32 min  PT Assessment / Plan / Recommendation Comments on Treatment Session  pt presents with R groin and scrotal wound s/p I+D.  pt improving mobility and motivated to ambulate.  pt concerned about wound and performing dressing changes.      Follow Up Recommendations  Home health PT;Supervision/Assistance - 24 hour     Does the patient have the potential to tolerate intense rehabilitation     Barriers to Discharge        Equipment Recommendations  Rolling walker with 5" wheels    Recommendations for Other Services    Frequency Min 3X/week   Plan Discharge plan remains appropriate;Frequency remains appropriate    Precautions / Restrictions Precautions Precautions: Fall Precaution Comments: scrotal edema Restrictions Weight Bearing Restrictions: No   Pertinent Vitals/Pain Indicates R groin/scrotal pain in sitting.      Mobility  Bed Mobility Bed Mobility: Rolling Right;Right Sidelying to Sit;Sitting - Scoot to Edge of Bed;Sit to Supine Rolling Right: 5: Supervision;With rail Right Sidelying to Sit: 5: Supervision;With rails;HOB elevated Sitting - Scoot to Edge of Bed: 5: Supervision Sit to Supine: 4: Min assist Details for Bed Mobility Assistance: Only A needed to bring R LE back into bed, otherwise pt able to use rails to complete bed mobility without A.   Transfers Transfers: Sit to Stand;Stand to Sit Sit to Stand: 4: Min guard;With upper extremity assist;From bed Stand to Sit: 4: Min guard;With upper extremity assist;To bed Details for Transfer Assistance: cues for UE use, controlling descent to bed.   Ambulation/Gait Ambulation/Gait Assistance: 4: Min guard Ambulation Distance (Feet): 90 Feet Assistive device: Rolling walker Ambulation/Gait Assistance Details: cues for upright posture.  pt stands behind  RW due to increased BOS with scrotal wound and edema.   Gait Pattern: Step-to pattern;Wide base of support;Trunk flexed Stairs: No Wheelchair Mobility Wheelchair Mobility: No    Exercises     PT Diagnosis:    PT Problem List:   PT Treatment Interventions:     PT Goals Acute Rehab PT Goals Time For Goal Achievement: 03/08/13 Potential to Achieve Goals: Good PT Goal: Supine/Side to Sit - Progress: Progressing toward goal PT Goal: Sit to Supine/Side - Progress: Progressing toward goal PT Goal: Sit to Stand - Progress: Progressing toward goal PT Goal: Stand to Sit - Progress: Progressing toward goal PT Goal: Ambulate - Progress: Progressing toward goal  Visit Information  Last PT Received On: 02/25/13 Assistance Needed: +1    Subjective Data  Subjective: They're supposed to change this dressing some time this morning.     Cognition  Cognition Overall Cognitive Status: Appears within functional limits for tasks assessed/performed Arousal/Alertness: Awake/alert Orientation Level: Appears intact for tasks assessed Behavior During Session: [facility] for tasks performed Cognition - Other Comments: pt cognition seems good today.  pt seems simple, but no cognitive deficits.      Balance  Balance Balance Assessed: No  End of Session PT - End of Session Equipment Utilized During Treatment: Gait belt Activity Tolerance: Patient tolerated treatment well Patient left: in bed;with call bell/phone within reach Nurse Communication: Mobility status   GP     Sunny Schlein, Bibb [phone] 02/25/2013, 11:19 AM

## 2013-02-26 DIAGNOSIS — E119 Type 2 diabetes mellitus without complications: Secondary | ICD-10-CM

## 2013-02-26 LAB — BASIC METABOLIC PANEL
CO2: 23 mEq/L (ref 19–32)
Calcium: 9.4 mg/dL (ref 8.4–10.5)
Glucose, Bld: 99 mg/dL (ref 70–99)
Sodium: 131 mEq/L — ABNORMAL LOW (ref 135–145)

## 2013-02-26 LAB — VANCOMYCIN, TROUGH: Vancomycin Tr: 12.6 ug/mL (ref 10.0–20.0)

## 2013-02-26 LAB — GLUCOSE, CAPILLARY
Glucose-Capillary: 108 mg/dL — ABNORMAL HIGH (ref 70–99)
Glucose-Capillary: 148 mg/dL — ABNORMAL HIGH (ref 70–99)
Glucose-Capillary: 97 mg/dL (ref 70–99)

## 2013-02-26 LAB — CBC
MCH: 29.9 pg (ref 26.0–34.0)
MCV: 88.9 fL (ref 78.0–100.0)
Platelets: 414 10*3/uL — ABNORMAL HIGH (ref 150–400)
RBC: 3.78 MIL/uL — ABNORMAL LOW (ref 4.22–5.81)

## 2013-02-26 MED ORDER — VANCOMYCIN HCL 10 G IV SOLR
1250.0000 mg | Freq: Two times a day (BID) | INTRAVENOUS | Status: DC
  Administered 2013-02-27 – 2013-02-28 (×3): 1250 mg via INTRAVENOUS
  Filled 2013-02-26 (×4): qty 1250

## 2013-02-26 MED ORDER — SENNOSIDES-DOCUSATE SODIUM 8.6-50 MG PO TABS
1.0000 | ORAL_TABLET | Freq: Two times a day (BID) | ORAL | Status: DC
  Administered 2013-02-26 – 2013-03-02 (×7): 1 via ORAL
  Filled 2013-02-26 (×7): qty 1

## 2013-02-26 MED ORDER — FUROSEMIDE 20 MG PO TABS: 20.0000 mg | ORAL_TABLET | Freq: Every day | ORAL | Status: DC

## 2013-02-26 MED ORDER — FUROSEMIDE 20 MG PO TABS
20.0000 mg | ORAL_TABLET | ORAL | Status: DC
  Administered 2013-02-26 – 2013-03-02 (×3): 20 mg via ORAL
  Filled 2013-02-26 (×5): qty 1

## 2013-02-26 MED ORDER — POLYETHYLENE GLYCOL 3350 17 G PO PACK
17.0000 g | PACK | Freq: Every day | ORAL | Status: DC
  Administered 2013-02-26 – 2013-03-02 (×5): 17 g via ORAL
  Filled 2013-02-26 (×6): qty 1

## 2013-02-26 NOTE — Progress Notes (Signed)
6 Days Post-Op  Subjective: Doing well, he never complains  Objective: Vital signs in last 24 hours: Temp:  [97.9 F (36.6 C)-98.5 F (36.9 C)] 98.2 F (36.8 C) (03/08 0515) Pulse Rate:  [69-86] 69 (03/08 0515) Resp:  [18] 18 (03/08 0515) BP: (124-144)/(59-64) 144/59 mmHg (03/08 0515) SpO2:  [99 %-100 %] 100 % (03/08 0515) Weight:  [242 lb 15.2 oz (110.2 kg)] 242 lb 15.2 oz (110.2 kg) (03/08 0500) Last BM Date: 02/21/13  VSS, labs stable, K+ up some, but not really getting any to speak of   Intake/Output from previous day: 03/07 0701 - 03/08 0700 In: 720 [P.O.:720] Out: 2600 [Urine:2600] Intake/Output this shift: Total I/O In: -  Out: 350 [Urine:350]  General appearance: alert, cooperative and no distress Skin: wounds are clean and good granulation tissue, the right side has a depth of abut 7-8 cm, with some indruation.  the left side is still about  5 cm deep on probing , I get more purulence when I probe with an applicator stick.  Over all it looks better induration still present on left, but no cellulitis  Right thigh is also stable.  Lab Results:   Recent Labs  02/25/13 0435 02/26/13 0546  WBC 13.0* 12.3*  HGB 10.7* 11.3*  HCT 31.1* 33.6*  PLT 337 414*    BMET  Recent Labs  02/25/13 0435 02/26/13 0546  NA 132* 131*  K 4.7 5.1  CL 98 98  CO2 25 23  GLUCOSE 145* 99  BUN 11 11  CREATININE 0.81 0.86  CALCIUM 9.1 9.4   PT/INR No results found for this basename: LABPROT, INR,  in the last 72 hours  No results found for this basename: AST, ALT, ALKPHOS, BILITOT, PROT, ALBUMIN,  in the last 168 hours   Lipase  No results found for this basename: lipase     Studies/Results: No results found.  Medications: . aspirin  325 mg Oral Daily  . carvedilol  6.25 mg Oral BID WC  . enoxaparin  40 mg Subcutaneous Q24H  . insulin aspart  0-15 Units Subcutaneous TID WC  . insulin aspart  0-5 Units Subcutaneous QHS  . insulin aspart  4 Units Subcutaneous TID WC   . lisinopril  2.5 mg Oral Daily  . piperacillin-tazobactam (ZOSYN)  IV  3.375 g Intravenous Q8H  . simvastatin  40 mg Oral q1800  . tamsulosin  0.4 mg Oral QPC supper  . vancomycin  1,000 mg Intravenous Q12H    Assessment/Plan necrotizing infection of the suprapubic region, right groin, right thigh, and right hemiscrotum; s/p debridement of necrotizing infection of the suprapubic region, right groin, right thigh, and right hemiscrotum  Surgeon: TSUEI,MATTHEW K.,02/20/2013.  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 No hx of MI. He is able to work and daily activities do not seem impaired. (Current 2D shows EF 25-30%)  Hypertension  Hyperlipidemia  Body mass index is 40.7  Hx of ETOH and tobacco abuse.   Plan:  He's growing a few enterococcus, and seems to be doing well.  Will discuss stopping Vancomycin.  LOS: 6 days    [name],[name] 02/26/2013

## 2013-02-26 NOTE — Progress Notes (Signed)
ANTIBIOTIC CONSULT NOTE - FOLLOW UP  Pharmacy Consult:  Vancomycin / Zosyn Indication: Enterococcus infection of the suprapubic region  Allergies  Allergen Reactions  . Ibuprofen Nausea Only    Patient Measurements: Height: 5\' 6"  (167.6 cm) Weight: 242 lb 15.2 oz (110.2 kg) IBW/kg (Calculated) : 63.8  Vital Signs: Temp: 98.2 F (36.8 C) (03/08 0515) Temp src: Oral (03/08 0515) BP: 144/59 mmHg (03/08 0515) Pulse Rate: 69 (03/08 0515) Intake/Output from previous day: 03/07 0701 - 03/08 0700 In: 720 [P.O.:720] Out: 2600 [Urine:2600] Intake/Output from this shift: Total I/O In: -  Out: 350 [Urine:350]  Labs:  Recent Labs  02/24/13 0645 02/25/13 0435 02/26/13 0546  WBC 13.0* 13.0* 12.3*  HGB 10.7* 10.7* 11.3*  PLT 312 337 414*  CREATININE 0.78 0.81 0.86   Estimated Creatinine Clearance: 90.5 ml/min (by C-G formula based on Cr of 0.86).  Recent Labs  02/26/13 1251  VANCOTROUGH 12.6     Microbiology: Recent Results (from the past 720 hour(s))  ANAEROBIC CULTURE     Status: None   Collection Time    02/20/13  8:00 AM      Result Value Range Status   Specimen Description ABSCESS RIGHT LEG   Final   Special Requests PATIENT ON FOLLOWING ZOSYN VANCOMYCIN   Final   Gram Stain     Final   Value: FEW WBC PRESENT,BOTH PMN AND MONONUCLEAR     RARE SQUAMOUS EPITHELIAL CELLS PRESENT     FEW GRAM POSITIVE COCCI IN CLUSTERS     IN PAIRS   Culture     Final   Value: NO ANAEROBES ISOLATED; CULTURE IN PROGRESS FOR 5 DAYS   Report Status PENDING   Incomplete  CULTURE, ROUTINE-ABSCESS     Status: None   Collection Time    02/20/13  8:00 AM      Result Value Range Status   Specimen Description ABSCESS RIGHT LEG   Final   Special Requests PATIENT ON FOLLOWING ZOSYN VANCOMYCIN   Final   Gram Stain     Final   Value: FEW WBC PRESENT,BOTH PMN AND MONONUCLEAR     RARE SQUAMOUS EPITHELIAL CELLS PRESENT     FEW GRAM POSITIVE COCCI IN CLUSTERS     IN PAIRS   Culture FEW  ENTEROCOCCUS SPECIES   Final   Report Status 02/23/2013 FINAL   Final   Organism ID, Bacteria ENTEROCOCCUS SPECIES   Final  MRSA PCR SCREENING     Status: None   Collection Time    02/20/13  2:24 PM      Result Value Range Status   MRSA by PCR NEGATIVE  NEGATIVE Final   Comment:            The GeneXpert MRSA Assay (FDA     approved for NASAL specimens     only), is one component of a     comprehensive MRSA colonization     surveillance program. It is not     intended to diagnose MRSA     infection nor to guide or     monitor treatment for     MRSA infections.    Assessment: 35 YOM presented with pelvic area induration, erythema, and purulent drainage.  Now s/p debridement of necrotizing infection of the suprapubic region on 3/2. Pt continues on vancomycin and Zosyn.  Patient's renal function stable. Trough today of 12.6 is slightly less than desired goal of ~15.  Vanc 3/2>> Zosyn 3/2>>  3/2 right leg abscess cx -  Few Enterococcus, Amp/Vanc sens  Goal of Therapy:  Vanc trough ~15 mcg/mL  Plan:  - Change vancomycin to 1250 gm IV Q12H (anticipated trough ~16) - Continue Zosyn 3.375gm IV Q8H, 4 hr infusion - Monitor renal fxn, clinical course - Consider d/c Vancomycin - Consider de-escalate Zosyn to Unasyn or Augmentin according to culture results. - Consider increasing Lovenox to 55mg  SQ Q24H for VTE px in patients with BMI > 30  Kona Lover L. Illene Bolus, PharmD, BCPS Clinical Pharmacist Pager: [phone] Pharmacy: [phone] 02/26/2013 3:21 PM

## 2013-02-26 NOTE — Progress Notes (Signed)
[facility] CONSULT PROGRESS NOTE  Trevor Rodriguez WUJ:[id number] DOB: [birth year] DOA: 02/20/2013 PCP: Eino Farber, MD  Brief narrative:  72 yo male who presented with one week of worsening swelling and erythema in his suprapubic region. This apparently was drained in the office by his primary care physician, but the swelling and pain worsened and extended down into his scrotum and thigh.  Pt was taken to OR on 02/20/13 for debridement of lower abdominal wall, suprapubic region, scrotum and R thigh 2/2 worsening infection.  [facility] was asked to provide medical consultative care during his hospital stay.  Assessment/Plan: Fournier's gangrene  Per primary service.  HTN  Stable.  New diagnosis of type 2 diabetes Was not medicated at home and no known hx of DM.  Hemoglobin A1c 6.5, qualifies for diagnosis of DM. Educate patient, CBG currently well controlled, transition to metformin 500 mg po bid at the time of discharge.  HLD  Continue home statin   Hx of severe systolic CHF  2-D echocardiogram on 02/21/2013 showed EF of 25-30%, diffuse hypokinesis.  Improved from previous 2-D echocardiogram. Continue home coreg and lasix in the setting of well compensated CHF. Continue ACE as renal function is holding steady (dual indication w/ DM).  Ongoing tobacco abuse  Cessation counseled   BPH  Continue home flomax dose   Morbid obesity  Body mass index is 40.7   Leukocytosis Improved, likely due to the above infection. Stable.  Prophylaxis Lovenox.  Code Status: Full code. Family Communication: No family present. Disposition Plan: Per primary service.  Procedures:  As above.  Antibiotics:  Vancomycin 02/20/2013 >>  Zosyn 02/20/2013 >>  HPI/Subjective: No other specific concerns. Surgical pain under control.  Objective: Filed Vitals:   02/25/13 1755 02/25/13 2213 02/26/13 0500 02/26/13 0515  BP: 124/64 128/64  144/59  Pulse: 76 70  69  Temp:  97.9 F (36.6 C)   98.2 F (36.8 C)  TempSrc:  Oral  Oral  Resp:  18  18  Height:      Weight:   110.2 kg (242 lb 15.2 oz)   SpO2:  100%  100%    Intake/Output Summary (Last 24 hours) at 02/26/13 1243 Last data filed at 02/26/13 1000  Gross per 24 hour  Intake    480 ml  Output   2000 ml  Net  -1520 ml   Filed Weights   02/23/13 0400 02/25/13 0547 02/26/13 0500  Weight: 111.4 kg (245 lb 9.5 oz) 112.4 kg (247 lb 12.8 oz) 110.2 kg (242 lb 15.2 oz)    Exam: Physical Exam: General: Awake, Oriented, No acute distress. HEENT: EOMI. Neck: Supple CV: S1 and S2 Lungs: Clear to ascultation bilaterally Abdomen: Soft, Nontender, Nondistended, +bowel sounds. Ext: Good pulses. Trace edema.  Data Reviewed: Basic Metabolic Panel:  Recent Labs Lab 02/22/13 0321 02/23/13 0447 02/24/13 0645 02/25/13 0435 02/26/13 0546  NA 134* 133* 133* 132* 131*  K 4.0 4.5 4.7 4.7 5.1  CL 99 100 100 98 98  CO2 25 25 24 25 23   GLUCOSE 107* 130* 112* 145* 99  BUN 15 11 9 11 11   CREATININE 0.99 0.85 0.78 0.81 0.86  CALCIUM 8.5 8.7 9.1 9.1 9.4   Liver Function Tests: No results found for this basename: AST, ALT, ALKPHOS, BILITOT, PROT, ALBUMIN,  in the last 168 hours No results found for this basename: LIPASE, AMYLASE,  in the last 168 hours No results found for this basename: AMMONIA,  in the last 168 hours CBC:  Recent Labs Lab 02/20/13 0150  02/22/13 0321 02/23/13 0447 02/24/13 0645 02/25/13 0435 02/26/13 0546  WBC 14.7*  < > 16.1* 14.0* 13.0* 13.0* 12.3*  NEUTROABS 12.2*  --   --   --   --   --   --   HGB 11.2*  < > 10.5* 10.5* 10.7* 10.7* 11.3*  HCT 31.5*  < > 30.3* 30.2* 31.4* 31.1* 33.6*  MCV 85.4  < > 86.3 84.8 86.0 88.1 88.9  PLT 244  < > 300 323 312 337 414*  < > = values in this interval not displayed. Cardiac Enzymes:  Recent Labs Lab 02/20/13 0738  TROPONINI <0.30   BNP (last 3 results) No results found for this basename: PROBNP,  in the last 8760 hours CBG:  Recent Labs Lab  02/25/13 1248 02/25/13 1731 02/25/13 2125 02/26/13 0755 02/26/13 1143  GLUCAP 102* 121* 108* 108* 126*    Recent Results (from the past 240 hour(s))  ANAEROBIC CULTURE     Status: None   Collection Time    02/20/13  8:00 AM      Result Value Range Status   Specimen Description ABSCESS RIGHT LEG   Final   Special Requests PATIENT ON FOLLOWING ZOSYN VANCOMYCIN   Final   Gram Stain     Final   Value: FEW WBC PRESENT,BOTH PMN AND MONONUCLEAR     RARE SQUAMOUS EPITHELIAL CELLS PRESENT     FEW GRAM POSITIVE COCCI IN CLUSTERS     IN PAIRS   Culture     Final   Value: NO ANAEROBES ISOLATED; CULTURE IN PROGRESS FOR 5 DAYS   Report Status PENDING   Incomplete  CULTURE, ROUTINE-ABSCESS     Status: None   Collection Time    02/20/13  8:00 AM      Result Value Range Status   Specimen Description ABSCESS RIGHT LEG   Final   Special Requests PATIENT ON FOLLOWING ZOSYN VANCOMYCIN   Final   Gram Stain     Final   Value: FEW WBC PRESENT,BOTH PMN AND MONONUCLEAR     RARE SQUAMOUS EPITHELIAL CELLS PRESENT     FEW GRAM POSITIVE COCCI IN CLUSTERS     IN PAIRS   Culture FEW ENTEROCOCCUS SPECIES   Final   Report Status 02/23/2013 FINAL   Final   Organism ID, Bacteria ENTEROCOCCUS SPECIES   Final  MRSA PCR SCREENING     Status: None   Collection Time    02/20/13  2:24 PM      Result Value Range Status   MRSA by PCR NEGATIVE  NEGATIVE Final   Comment:            The GeneXpert MRSA Assay (FDA     approved for NASAL specimens     only), is one component of a     comprehensive MRSA colonization     surveillance program. It is not     intended to diagnose MRSA     infection nor to guide or     monitor treatment for     MRSA infections.     Studies: No results found.  Scheduled Meds: . aspirin  325 mg Oral Daily  . carvedilol  6.25 mg Oral BID WC  . enoxaparin  40 mg Subcutaneous Q24H  . insulin aspart  0-15 Units Subcutaneous TID WC  . insulin aspart  0-5 Units Subcutaneous QHS  .  insulin aspart  4 Units Subcutaneous TID WC  . lisinopril  2.5 mg Oral Daily  . piperacillin-tazobactam (ZOSYN)  IV  3.375 g Intravenous Q8H  . simvastatin  40 mg Oral q1800  . tamsulosin  0.4 mg Oral QPC supper  . vancomycin  1,000 mg Intravenous Q12H   Continuous Infusions: . 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 If 7PM-7AM, please contact night-coverage at [email or web address], password [facility] 02/26/2013, 12:43 PM  LOS: 6 days

## 2013-02-27 LAB — BASIC METABOLIC PANEL
CO2: 24 mEq/L (ref 19–32)
Calcium: 8.8 mg/dL (ref 8.4–10.5)
GFR calc non Af Amer: 80 mL/min — ABNORMAL LOW (ref >90)
Sodium: 130 mEq/L — ABNORMAL LOW (ref 135–145)

## 2013-02-27 LAB — ANAEROBIC CULTURE

## 2013-02-27 LAB — CBC
Platelets: 395 10*3/uL (ref 150–400)
RBC: 3.49 MIL/uL — ABNORMAL LOW (ref 4.22–5.81)
WBC: 9.8 10*3/uL (ref 4.0–10.5)

## 2013-02-27 LAB — GLUCOSE, CAPILLARY
Glucose-Capillary: 111 mg/dL — ABNORMAL HIGH (ref 70–99)
Glucose-Capillary: 131 mg/dL — ABNORMAL HIGH (ref 70–99)
Glucose-Capillary: 97 mg/dL (ref 70–99)

## 2013-02-27 NOTE — Progress Notes (Signed)
[facility] CONSULT PROGRESS NOTE  Toma Arts ZOX:[id number] DOB: [birth year] DOA: 02/20/2013 PCP: Eino Farber, MD  Brief narrative:  72 yo male who presented with one week of worsening swelling and erythema in his suprapubic region. This apparently was drained in the office by his primary care physician, but the swelling and pain worsened and extended down into his scrotum and thigh.  Pt was taken to OR on 02/20/13 for debridement of lower abdominal wall, suprapubic region, scrotum and R thigh 2/2 worsening infection.   Assessment/Plan: Fournier's gangrene  Per primary service.  HTN  Stable.  New diagnosis of type 2 diabetes Was not medicated at home and no known hx of DM.  Hemoglobin A1c 6.5, qualifies for diagnosis of DM. Educate patient, CBG currently well controlled, transition to metformin 500 mg po bid at the time of discharge.  HLD  Continue home statin   Hx of severe systolic CHF  2-D echocardiogram on 02/21/2013 showed EF of 25-30%, diffuse hypokinesis.  Improved from previous 2-D echocardiogram. Continue home coreg and lasix in the setting of well compensated CHF. Continue ACE as renal function is holding steady (dual indication w/ DM).  Ongoing tobacco abuse  Cessation counseled   BPH  Continue home flomax dose   Morbid obesity  Body mass index is 40.7   Leukocytosis Improved, likely due to the above infection. Stable.  Prophylaxis Lovenox.  Code Status: Full code. Family Communication: No family present. Disposition Plan: Per primary service.  Procedures:  As above.  Antibiotics:  Vancomycin 02/20/2013 >>  Zosyn 02/20/2013 >>  HPI/Subjective: No specific concerns.  Objective: Filed Vitals:   02/26/13 1500 02/26/13 2254 02/27/13 0716 02/27/13 1130  BP: 106/54 137/67 122/100 128/62  Pulse: 75 80 76   Temp: 98 F (36.7 C) 98.5 F (36.9 C) 98.2 F (36.8 C)   TempSrc: Oral Oral Oral   Resp: 20 18 18    Height:      Weight:    109.5 kg (241 lb 6.5 oz)   SpO2: 100% 100% 100%     Intake/Output Summary (Last 24 hours) at 02/27/13 1229 Last data filed at 02/27/13 0546  Gross per 24 hour  Intake    779 ml  Output   1075 ml  Net   -296 ml   Filed Weights   02/25/13 0547 02/26/13 0500 02/27/13 0716  Weight: 112.4 kg (247 lb 12.8 oz) 110.2 kg (242 lb 15.2 oz) 109.5 kg (241 lb 6.5 oz)    Exam: Physical Exam: General: Awake, Oriented, No acute distress. HEENT: EOMI. Neck: Supple CV: S1 and S2 Lungs: Clear to ascultation bilaterally Abdomen: Soft, Nontender, Nondistended, +bowel sounds. Ext: Good pulses. Trace edema.  Data Reviewed: Basic Metabolic Panel:  Recent Labs Lab 02/23/13 0447 02/24/13 0645 02/25/13 0435 02/26/13 0546 02/27/13 0545  NA 133* 133* 132* 131* 130*  K 4.5 4.7 4.7 5.1 4.8  CL 100 100 98 98 96  CO2 25 24 25 23 24   GLUCOSE 130* 112* 145* 99 136*  BUN 11 9 11 11 17   CREATININE 0.85 0.78 0.81 0.86 0.99  CALCIUM 8.7 9.1 9.1 9.4 8.8   Liver Function Tests: No results found for this basename: AST, ALT, ALKPHOS, BILITOT, PROT, ALBUMIN,  in the last 168 hours No results found for this basename: LIPASE, AMYLASE,  in the last 168 hours No results found for this basename: AMMONIA,  in the last 168 hours CBC:  Recent Labs Lab 02/23/13 0447 02/24/13 0645 02/25/13 0435 02/26/13 0546 02/27/13  0545  WBC 14.0* 13.0* 13.0* 12.3* 9.8  HGB 10.5* 10.7* 10.7* 11.3* 10.7*  HCT 30.2* 31.4* 31.1* 33.6* 31.2*  MCV 84.8 86.0 88.1 88.9 89.4  PLT 323 312 337 414* 395   Cardiac Enzymes: No results found for this basename: CKTOTAL, CKMB, CKMBINDEX, TROPONINI,  in the last 168 hours BNP (last 3 results) No results found for this basename: PROBNP,  in the last 8760 hours CBG:  Recent Labs Lab 02/26/13 0755 02/26/13 1143 02/26/13 1649 02/26/13 2251 02/27/13 0805  GLUCAP 108* 126* 148* 97 111*    Recent Results (from the past 240 hour(s))  ANAEROBIC CULTURE     Status: None    Collection Time    02/20/13  8:00 AM      Result Value Range Status   Specimen Description ABSCESS RIGHT LEG   Final   Special Requests PATIENT ON FOLLOWING ZOSYN VANCOMYCIN   Final   Gram Stain     Final   Value: FEW WBC PRESENT,BOTH PMN AND MONONUCLEAR     RARE SQUAMOUS EPITHELIAL CELLS PRESENT     FEW GRAM POSITIVE COCCI IN CLUSTERS     IN PAIRS   Culture     Final   Value: NO ANAEROBES ISOLATED; CULTURE IN PROGRESS FOR 5 DAYS   Report Status PENDING   Incomplete  CULTURE, ROUTINE-ABSCESS     Status: None   Collection Time    02/20/13  8:00 AM      Result Value Range Status   Specimen Description ABSCESS RIGHT LEG   Final   Special Requests PATIENT ON FOLLOWING ZOSYN VANCOMYCIN   Final   Gram Stain     Final   Value: FEW WBC PRESENT,BOTH PMN AND MONONUCLEAR     RARE SQUAMOUS EPITHELIAL CELLS PRESENT     FEW GRAM POSITIVE COCCI IN CLUSTERS     IN PAIRS   Culture FEW ENTEROCOCCUS SPECIES   Final   Report Status 02/23/2013 FINAL   Final   Organism ID, Bacteria ENTEROCOCCUS SPECIES   Final  MRSA PCR SCREENING     Status: None   Collection Time    02/20/13  2:24 PM      Result Value Range Status   MRSA by PCR NEGATIVE  NEGATIVE Final   Comment:            The GeneXpert MRSA Assay (FDA     approved for NASAL specimens     only), is one component of a     comprehensive MRSA colonization     surveillance program. It is not     intended to diagnose MRSA     infection nor to guide or     monitor treatment for     MRSA infections.     Studies: No results found.  Scheduled Meds: . aspirin  325 mg Oral Daily  . carvedilol  6.25 mg Oral BID WC  . enoxaparin  40 mg Subcutaneous Q24H  . furosemide  20 mg Oral QODAY  . insulin aspart  0-15 Units Subcutaneous TID WC  . insulin aspart  0-5 Units Subcutaneous QHS  . insulin aspart  4 Units Subcutaneous TID WC  . lisinopril  2.5 mg Oral Daily  . piperacillin-tazobactam (ZOSYN)  IV  3.375 g Intravenous Q8H  . polyethylene glycol   17 g Oral Daily  . senna-docusate  1 tablet Oral BID  . simvastatin  40 mg Oral q1800  . tamsulosin  0.4 mg Oral QPC supper  .  vancomycin  1,250 mg Intravenous Q12H   Continuous Infusions: . 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 If 7PM-7AM, please contact night-coverage at [email or web address], password [facility] 02/27/2013, 12:29 PM  LOS: 7 days

## 2013-02-27 NOTE — Progress Notes (Signed)
I have seen and examined the patient and agree with the assessment and plans.  Continuing wound care, antibiotics.  Will need home health at discharge  [facility] A. Magnus Ivan  MD, FACS

## 2013-02-27 NOTE — Progress Notes (Signed)
7 Days Post-Op  Subjective: Stable no complaints, he lives with grandson, he will start looking for someone to help with dressing changes at home. Objective: Vital signs in last 24 hours: Temp:  [98 F (36.7 C)-98.5 F (36.9 C)] 98.2 F (36.8 C) (03/09 0716) Pulse Rate:  [75-80] 76 (03/09 0716) Resp:  [18-20] 18 (03/09 0716) BP: (106-137)/(54-100) 122/100 mmHg (03/09 0716) SpO2:  [100 %] 100 % (03/09 0716) Weight:  [241 lb 6.5 oz (109.5 kg)] 241 lb 6.5 oz (109.5 kg) (03/09 0716) Last BM Date: 02/21/13 Afebrile, VSS, BP up 0700 I doubt it is correct. Na down some, but otherwise labs are stable Intake/Output from previous day: 03/08 0701 - 03/09 0700 In: 779 [P.O.:30; I.V.:749] Out: 1425 [Urine:1425] Intake/Output this shift:    General appearance: alert, cooperative and no distress Skin: Dressing changed and it continues to improve.  Lab Results:   Recent Labs  02/26/13 0546 02/27/13 0545  WBC 12.3* 9.8  HGB 11.3* 10.7*  HCT 33.6* 31.2*  PLT 414* 395    BMET  Recent Labs  02/26/13 0546 02/27/13 0545  NA 131* 130*  K 5.1 4.8  CL 98 96  CO2 23 24  GLUCOSE 99 136*  BUN 11 17  CREATININE 0.86 0.99  CALCIUM 9.4 8.8   PT/INR No results found for this basename: LABPROT, INR,  in the last 72 hours  No results found for this basename: AST, ALT, ALKPHOS, BILITOT, PROT, ALBUMIN,  in the last 168 hours   Lipase  No results found for this basename: lipase     Studies/Results: No results found.  Medications: . aspirin  325 mg Oral Daily  . carvedilol  6.25 mg Oral BID WC  . enoxaparin  40 mg Subcutaneous Q24H  . furosemide  20 mg Oral QODAY  . insulin aspart  0-15 Units Subcutaneous TID WC  . insulin aspart  0-5 Units Subcutaneous QHS  . insulin aspart  4 Units Subcutaneous TID WC  . lisinopril  2.5 mg Oral Daily  . piperacillin-tazobactam (ZOSYN)  IV  3.375 g Intravenous Q8H  . polyethylene glycol  17 g Oral Daily  . senna-docusate  1 tablet Oral BID  .  simvastatin  40 mg Oral q1800  . tamsulosin  0.4 mg Oral QPC supper  . vancomycin  1,250 mg Intravenous Q12H    Assessment/Plan necrotizing infection of the suprapubic region, right groin, right thigh, and right hemiscrotum; s/p debridement of necrotizing infection of the suprapubic region, right groin, right thigh, and right hemiscrotum  Culture shows a few enterococcus species Surgeon: Manus Rudd K.,02/20/2013.  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 No hx of MI. He is able to work and daily activities do not seem impaired. (Current 2D shows EF 25-30%)  Hypertension  Hyperlipidemia  Body mass index is 40.7  Hx of ETOH and tobacco abuse.  Plan:  Continue antibiotics and dressing change, discuss when we can go to po antibiotics.  [name],[name] 02/27/2013

## 2013-02-28 DIAGNOSIS — E785 Hyperlipidemia, unspecified: Secondary | ICD-10-CM

## 2013-02-28 LAB — CBC
MCV: 88.8 fL (ref 78.0–100.0)
Platelets: 402 10*3/uL — ABNORMAL HIGH (ref 150–400)
RDW: 14.3 % (ref 11.5–15.5)
WBC: 7.2 10*3/uL (ref 4.0–10.5)

## 2013-02-28 LAB — GLUCOSE, CAPILLARY
Glucose-Capillary: 105 mg/dL — ABNORMAL HIGH (ref 70–99)
Glucose-Capillary: 112 mg/dL — ABNORMAL HIGH (ref 70–99)
Glucose-Capillary: 102 mg/dL — ABNORMAL HIGH (ref 70–99)

## 2013-02-28 LAB — BASIC METABOLIC PANEL
Calcium: 8.9 mg/dL (ref 8.4–10.5)
Creatinine, Ser: 0.95 mg/dL (ref 0.50–1.35)
GFR calc Af Amer: >90 mL/min (ref >90)

## 2013-02-28 MED ORDER — AMOXICILLIN-POT CLAVULANATE 875-125 MG PO TABS
1.0000 | ORAL_TABLET | Freq: Two times a day (BID) | ORAL | Status: DC
  Administered 2013-02-28 – 2013-03-02 (×5): 1 via ORAL
  Filled 2013-02-28 (×7): qty 1

## 2013-02-28 MED ORDER — LISINOPRIL 2.5 MG PO TABS: 2.5000 mg | ORAL_TABLET | Freq: Every day | ORAL | Status: DC

## 2013-02-28 MED ORDER — METFORMIN HCL 500 MG PO TABS: 500.0000 mg | ORAL_TABLET | Freq: Two times a day (BID) | ORAL | Status: AC

## 2013-02-28 NOTE — Progress Notes (Signed)
Occupational Therapy Treatment Patient Details Name: Trevor Rodriguez MRN: [id number] DOB: 1941-03-04 Today's Date: 02/28/2013 Time: 4098-1191 OT Time Calculation (min): 37 min  OT Assessment / Plan / Recommendation Comments on Treatment Session Pt is making steady progress with OT.  Needs use of AE (reacher, sockaide, LH sponge) to be at supervision level for selfcare.  Also able to perform all functional mobility during session with supervision using the RW for support.      Follow Up Recommendations  Home health OT    Barriers to Discharge       Equipment Recommendations  3 in 1 bedside comode       Frequency Min 2X/week   Plan Discharge plan remains appropriate    Precautions / Restrictions Precautions Precautions: Fall Precaution Comments: scrotal edema Restrictions Weight Bearing Restrictions: No   Pertinent Vitals/Pain Pain 8/10 in groin, meds given earlier and pt also repositioned at end of session    ADL  Lower Body Dressing: Performed;Supervision/safety (with use of AE for donning/doffing sock) Where Assessed - Lower Body Dressing: Supported sit to stand Toilet Transfer: Research scientist (life sciences) Method: Other (comment) (ambulate with RW ) Toilet Transfer Equipment: Regular height toilet;Other (comment) (Pt stood to ambulate in the urinal in the bathroom) Toileting - Clothing Manipulation and Hygiene: Performed;Supervision/safety Where Assessed - Toileting Clothing Manipulation and Hygiene: Standing Transfers/Ambulation Related to ADLs: Pt is overall supervision level for mobility using the RW for support. ADL Comments: Pt educated on correct method for donning and doffing socks using the reacher and sockaide.      OT Goals ADL Goals ADL Goal: Lower Body Dressing - Progress: Met ADL Goal: Toilet Transfer - Progress: Met  Visit Information  Last OT Received On: 02/28/13 Assistance Needed: +1    Subjective Data  Subjective: I usually can  tell about a person when I see them come in. Patient Stated Goal: Pt did not state.      Cognition  Cognition Overall Cognitive Status: Appears within functional limits for tasks assessed/performed Arousal/Alertness: Awake/alert Orientation Level: Appears intact for tasks assessed Behavior During Session: [facility] for tasks performed    Mobility  Transfers Transfers: Sit to Stand Sit to Stand: 5: Supervision;With armrests;With upper extremity assist;From chair/3-in-1 Stand to Sit: To chair/3-in-1;5: Supervision;With upper extremity assist       Balance Static Standing Balance Static Standing - Balance Support: No upper extremity supported Static Standing - Level of Assistance: 5: Stand by assistance   End of Session OT - End of Session Activity Tolerance: Patient tolerated treatment well Patient left: in chair;with nursing in room     [facility] OTR/L Pager number [phone] 02/28/2013, 2:47 PM

## 2013-02-28 NOTE — Care Management Note (Signed)
  Page 2 of 2   03/02/2013     10:02:03 AM   CARE MANAGEMENT NOTE 03/02/2013  Patient:  Trevor Rodriguez, Trevor Rodriguez   Account Number:  [id number]  Date Initiated:  02/24/2013  Documentation initiated by:  Ronny Flurry  Subjective/Objective Assessment:   necrotizing infection of the suprapubic region, right groin, right thigh, and right hemiscrotum; s/p debridement of necrotizing infection of the suprapubic region, right groin, right thigh, and right hemiscrotum     Action/Plan:   Anticipated DC Date:  03/02/2013   Anticipated DC Plan:  HOME W HOME HEALTH SERVICES         Choice offered to / List presented to:  C-1 Patient        HH arranged  HH-1 RN  HH-2 PT  HH-3 OT      [facility] agency  [facility]   Status of service:  Completed, signed off Medicare Important Message given?   (If response is "NO", the following Medicare IM given date fields will be blank) Date Medicare IM given:   Date Additional Medicare IM given:    Discharge Disposition:    Per UR Regulation:    If discussed at Long Length of Stay Meetings, dates discussed:    Comments:  03-01-12  Spoke with patient regarding home health , patient deferred to daughter Meriam Sprague [phone].  She picked [facility] , she can come to hospital at anytime to be shown how to do dressing change.  Daughters contact information given to bedside nurse Loren.  Ronny Flurry RN BSn [phone]    02-28-13 MD documents in notes patient will need a home health nurse at discharge.  Discussed home health with patient provided list of agencies for [facility] . Patient will discuss home health with his daughter and make decision.  Facesheet information confirmed , lives at [address]   Ronny Flurry RN BSN [phone]

## 2013-02-28 NOTE — Progress Notes (Signed)
Patient interviewed and examined, agree with PA note above.  Benjamin T Hoxworth MD, FACS  02/28/2013 9:16 AM  

## 2013-02-28 NOTE — Progress Notes (Signed)
8 Days Post-Op  Subjective: Stable no complaints, he lives with grandson, he will start looking for someone to help with dressing changes at home. Objective: Vital signs in last 24 hours: Temp:  [98.1 F (36.7 C)-98.4 F (36.9 C)] 98.4 F (36.9 C) (03/10 0621) Pulse Rate:  [68-82] 82 (03/10 0621) Resp:  [18] 18 (03/10 0621) BP: (113-128)/(56-75) 124/56 mmHg (03/10 0621) SpO2:  [98 %-100 %] 98 % (03/10 0621) Weight:  [241 lb 13.5 oz (109.7 kg)] 241 lb 13.5 oz (109.7 kg) (03/10 0621) Last BM Date: 02/21/13 Afebrile, VSS, BP up 0700 I doubt it is correct. Na down some, but otherwise labs are stable Intake/Output from previous day: 03/09 0701 - 03/10 0700 In: -  Out: 1700 [Urine:1700] Intake/Output this shift: Total I/O In: -  Out: 300 [Urine:300]  PE: GENERAL:  Adult obese AA  male. In no discomfort; no respiratory distress. Groin: dressing, clean, dry and intact, less induration, especially in L inguinal region    Lab Results:   Recent Labs  02/27/13 0545 02/28/13 0650  WBC 9.8 7.2  HGB 10.7* 10.0*  HCT 31.2* 30.0*  PLT 395 402*    BMET  Recent Labs  02/27/13 0545 02/28/13 0650  NA 130* 133*  K 4.8 5.0  CL 96 100  CO2 24 25  GLUCOSE 136* 107*  BUN 17 17  CREATININE 0.99 0.95  CALCIUM 8.8 8.9   PT/INR No results found for this basename: LABPROT, INR,  in the last 72 hours  No results found for this basename: AST, ALT, ALKPHOS, BILITOT, PROT, ALBUMIN,  in the last 168 hours   Lipase  No results found for this basename: lipase     Studies/Results: No results found.  Medications: . aspirin  325 mg Oral Daily  . carvedilol  6.25 mg Oral BID WC  . enoxaparin  40 mg Subcutaneous Q24H  . furosemide  20 mg Oral QODAY  . insulin aspart  0-15 Units Subcutaneous TID WC  . insulin aspart  0-5 Units Subcutaneous QHS  . insulin aspart  4 Units Subcutaneous TID WC  . lisinopril  2.5 mg Oral Daily  . piperacillin-tazobactam (ZOSYN)  IV  3.375 g Intravenous  Q8H  . polyethylene glycol  17 g Oral Daily  . senna-docusate  1 tablet Oral BID  . simvastatin  40 mg Oral q1800  . tamsulosin  0.4 mg Oral QPC supper  . vancomycin  1,250 mg Intravenous Q12H    Assessment/Plan # necrotizing infection of the suprapubic region, right groin, right thigh, and right hemiscrotum; s/p debridement of necrotizing infection of the suprapubic region, right groin, right thigh, and right hemiscrotum  Culture shows a few enterococcus species - Surgeon: Manus Rudd K.,02/20/2013. S/p JP drain removal on 3/8.   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 No hx of MI. He is able to work and daily activities do not seem impaired. (Current 2D shows EF 25-30%)   # Hypertension  # Hyperlipidemia  # Body mass index is 40.7  # Hx of ETOH and tobacco abuse.  Plan:   Transition to PO Augmentin today.  Stop Vanco & Zosyn.   Continue dressing changes  DISPO:  Will need home health RN for daily dressing changes.   Andrena Mews, DO [facility] Resident - PGY-2 02/28/2013 8:37 AM

## 2013-02-28 NOTE — Progress Notes (Signed)
I have seen and agree with Dr. Janeece Riggers note. Pt's wound looks to be healing appropriately. Con't dressing change Will need HH for help with Dressing changes.

## 2013-02-28 NOTE — Progress Notes (Signed)
[facility] CONSULT PROGRESS NOTE  Trevor Rodriguez ZOX:[id number] DOB: 1941/07/11 DOA: 02/20/2013 PCP: Eino Farber, MD  Brief narrative:  72 yo male who presented with one week of worsening swelling and erythema in his suprapubic region. This apparently was drained in the office by his primary care physician, but the swelling and pain worsened and extended down into his scrotum and thigh.  Pt was taken to OR on 02/20/13 for debridement of lower abdominal wall, suprapubic region, scrotum and R thigh 2/2 worsening infection.   Assessment/Plan: Fournier's gangrene  Per primary service.  HTN  Stable.  New diagnosis of type 2 diabetes Was not medicated at home and no known hx of DM.  Hemoglobin A1c 6.5, qualifies for diagnosis of DM. Educate patient, CBG currently well controlled, transition to metformin 500 mg po bid at the time of discharge.  HLD  Continue home statin   Hx of severe systolic CHF  2-D echocardiogram on 02/21/2013 showed EF of 25-30%, diffuse hypokinesis.  Improved from previous 2-D echocardiogram. Continue home coreg and lasix in the setting of well compensated CHF. Continue ACE as renal function is holding steady (dual indication w/ DM).  Ongoing tobacco abuse  Cessation counseled   BPH  Continue home flomax dose   Morbid obesity  Body mass index is 40.7   Leukocytosis Improved, likely due to the above infection. Stable.  Prophylaxis Lovenox.  Code Status: Full code. Family Communication: No family present. Disposition Plan: Per primary service.  Procedures:  As above.  Antibiotics:  Vancomycin 02/20/2013 >> 02/28/2013  Zosyn 02/20/2013 >> 02/28/2013  Augmentin 02/28/2013 >>  HPI/Subjective: No specific concerns. Pain under control.  Objective: Filed Vitals:   02/27/13 1400 02/27/13 2258 02/28/13 0621 02/28/13 1050  BP: 124/75 113/60 124/56 120/50  Pulse: 72 68 82 80  Temp: 98.1 F (36.7 C) 98.1 F (36.7 C) 98.4 F (36.9 C) 98.1  F (36.7 C)  TempSrc: Oral Oral Oral Oral  Resp: 18 18 18    Height:      Weight:   109.7 kg (241 lb 13.5 oz)   SpO2: 100% 100% 98% 100%    Intake/Output Summary (Last 24 hours) at 02/28/13 1303 Last data filed at 02/28/13 1045  Gross per 24 hour  Intake      0 ml  Output   2300 ml  Net  -2300 ml   Filed Weights   02/26/13 0500 02/27/13 0716 02/28/13 0621  Weight: 110.2 kg (242 lb 15.2 oz) 109.5 kg (241 lb 6.5 oz) 109.7 kg (241 lb 13.5 oz)    Exam: Physical Exam: General: Awake, Oriented, No acute distress. HEENT: EOMI. Neck: Supple CV: S1 and S2 Lungs: Clear to ascultation bilaterally Abdomen: Soft, Nontender, Nondistended, +bowel sounds. Ext: Good pulses. Trace edema.  Data Reviewed: Basic Metabolic Panel:  Recent Labs Lab 02/24/13 0645 02/25/13 0435 02/26/13 0546 02/27/13 0545 02/28/13 0650  NA 133* 132* 131* 130* 133*  K 4.7 4.7 5.1 4.8 5.0  CL 100 98 98 96 100  CO2 24 25 23 24 25   GLUCOSE 112* 145* 99 136* 107*  BUN 9 11 11 17 17   CREATININE 0.78 0.81 0.86 0.99 0.95  CALCIUM 9.1 9.1 9.4 8.8 8.9   Liver Function Tests: No results found for this basename: AST, ALT, ALKPHOS, BILITOT, PROT, ALBUMIN,  in the last 168 hours No results found for this basename: LIPASE, AMYLASE,  in the last 168 hours No results found for this basename: AMMONIA,  in the last 168 hours CBC:  Recent Labs Lab 02/24/13 0645 02/25/13 0435 02/26/13 0546 02/27/13 0545 02/28/13 0650  WBC 13.0* 13.0* 12.3* 9.8 7.2  HGB 10.7* 10.7* 11.3* 10.7* 10.0*  HCT 31.4* 31.1* 33.6* 31.2* 30.0*  MCV 86.0 88.1 88.9 89.4 88.8  PLT 312 337 414* 395 402*   Cardiac Enzymes: No results found for this basename: CKTOTAL, CKMB, CKMBINDEX, TROPONINI,  in the last 168 hours BNP (last 3 results) No results found for this basename: PROBNP,  in the last 8760 hours CBG:  Recent Labs Lab 02/27/13 1234 02/27/13 1739 02/27/13 2300 02/28/13 0802 02/28/13 1200  GLUCAP 131* 112* 97 97 105*     Recent Results (from the past 240 hour(s))  ANAEROBIC CULTURE     Status: None   Collection Time    02/20/13  8:00 AM      Result Value Range Status   Specimen Description ABSCESS RIGHT LEG   Final   Special Requests PATIENT ON FOLLOWING ZOSYN VANCOMYCIN   Final   Gram Stain     Final   Value: FEW WBC PRESENT,BOTH PMN AND MONONUCLEAR     RARE SQUAMOUS EPITHELIAL CELLS PRESENT     FEW GRAM POSITIVE COCCI IN CLUSTERS     IN PAIRS   Culture     Final   Value: FEW PREVOTELLA SPECIES     Note: BETA LACTAMASE POSITIVE   Report Status 02/27/2013 FINAL   Final  CULTURE, ROUTINE-ABSCESS     Status: None   Collection Time    02/20/13  8:00 AM      Result Value Range Status   Specimen Description ABSCESS RIGHT LEG   Final   Special Requests PATIENT ON FOLLOWING ZOSYN VANCOMYCIN   Final   Gram Stain     Final   Value: FEW WBC PRESENT,BOTH PMN AND MONONUCLEAR     RARE SQUAMOUS EPITHELIAL CELLS PRESENT     FEW GRAM POSITIVE COCCI IN CLUSTERS     IN PAIRS   Culture FEW ENTEROCOCCUS SPECIES   Final   Report Status 02/23/2013 FINAL   Final   Organism ID, Bacteria ENTEROCOCCUS SPECIES   Final  MRSA PCR SCREENING     Status: None   Collection Time    02/20/13  2:24 PM      Result Value Range Status   MRSA by PCR NEGATIVE  NEGATIVE Final   Comment:            The GeneXpert MRSA Assay (FDA     approved for NASAL specimens     only), is one component of a     comprehensive MRSA colonization     surveillance program. It is not     intended to diagnose MRSA     infection nor to guide or     monitor treatment for     MRSA infections.     Studies: No results found.  Scheduled Meds: . amoxicillin-clavulanate  1 tablet Oral Q12H  . aspirin  325 mg Oral Daily  . carvedilol  6.25 mg Oral BID WC  . enoxaparin  40 mg Subcutaneous Q24H  . furosemide  20 mg Oral QODAY  . insulin aspart  0-15 Units Subcutaneous TID WC  . insulin aspart  0-5 Units Subcutaneous QHS  . insulin aspart  4  Units Subcutaneous TID WC  . lisinopril  2.5 mg Oral Daily  . polyethylene glycol  17 g Oral Daily  . senna-docusate  1 tablet Oral BID  . simvastatin  40 mg Oral  q1800  . tamsulosin  0.4 mg Oral QPC supper   Continuous Infusions: . 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 If 7PM-7AM, please contact night-coverage at [email or web address], password [facility] 02/28/2013, 1:03 PM  LOS: 8 days

## 2013-03-01 LAB — BASIC METABOLIC PANEL
Calcium: 9.4 mg/dL (ref 8.4–10.5)
GFR calc Af Amer: >90 mL/min (ref >90)
GFR calc non Af Amer: 83 mL/min — ABNORMAL LOW (ref >90)
Glucose, Bld: 89 mg/dL (ref 70–99)
Potassium: 5 mEq/L (ref 3.5–5.1)
Sodium: 132 mEq/L — ABNORMAL LOW (ref 135–145)

## 2013-03-01 LAB — CBC
Hemoglobin: 10.1 g/dL — ABNORMAL LOW (ref 13.0–17.0)
Platelets: 439 10*3/uL — ABNORMAL HIGH (ref 150–400)
RBC: 3.41 MIL/uL — ABNORMAL LOW (ref 4.22–5.81)
WBC: 6.2 10*3/uL (ref 4.0–10.5)

## 2013-03-01 LAB — GLUCOSE, CAPILLARY
Glucose-Capillary: 103 mg/dL — ABNORMAL HIGH (ref 70–99)
Glucose-Capillary: 97 mg/dL (ref 70–99)
Glucose-Capillary: 90 mg/dL (ref 70–99)
Glucose-Capillary: 97 mg/dL (ref 70–99)

## 2013-03-01 NOTE — Progress Notes (Signed)
Seen and agreed 03/01/2013 Robinette, Julia Elizabeth PTA [phone] pager [phone] office    

## 2013-03-01 NOTE — Progress Notes (Signed)
9 Days Post-Op  Subjective: He has no complaints as usual.  I talked with him over the weekend about finding someone to help with dressing changes at home.  He knows about home health, but no one else to help so far.  He is going to talk to family about it.  I told him we need someone to come and start learning how to do dressing changes.  Objective: Vital signs in last 24 hours: Temp:  [97.6 F (36.4 C)-98.2 F (36.8 C)] 98.2 F (36.8 C) (03/11 0626) Pulse Rate:  [75-80] 75 (03/11 0626) Resp:  [18-20] 18 (03/11 0626) BP: (107-127)/(50-57) 107/50 mmHg (03/11 0626) SpO2:  [97 %-100 %] 97 % (03/11 0626) Weight:  [239 lb 3.2 oz (108.5 kg)] 239 lb 3.2 oz (108.5 kg) (03/11 0626) Last BM Date: 02/27/13  Carb modified diet, afebrile, VSS, no labs,  Intake/Output from previous day: 03/10 0701 - 03/11 0700 In: 572.2 [P.O.:240; I.V.:332.2] Out: 2950 [Urine:2950] Intake/Output this shift:    General appearance: alert, cooperative and no distress Skin:  i redid the dressing the area on the right is about the same the area on the left can now be probed to 9 cm, and the hard indurated area is now able to be open and packed.  The tissue is looking much better, clean and pink.  Same with the area of the groin.  Lab Results:   Recent Labs  02/28/13 0650 03/01/13 0555  WBC 7.2 6.2  HGB 10.0* 10.1*  HCT 30.0* 29.8*  PLT 402* 439*    BMET  Recent Labs  02/28/13 0650 03/01/13 0555  NA 133* 132*  K 5.0 5.0  CL 100 99  CO2 25 25  GLUCOSE 107* 89  BUN 17 18  CREATININE 0.95 0.90  CALCIUM 8.9 9.4   PT/INR No results found for this basename: LABPROT, INR,  in the last 72 hours  No results found for this basename: AST, ALT, ALKPHOS, BILITOT, PROT, ALBUMIN,  in the last 168 hours   Lipase  No results found for this basename: lipase     Studies/Results: No results found.  Medications: . amoxicillin-clavulanate  1 tablet Oral Q12H  . aspirin  325 mg Oral Daily  . carvedilol   6.25 mg Oral BID WC  . enoxaparin  40 mg Subcutaneous Q24H  . furosemide  20 mg Oral QODAY  . insulin aspart  0-15 Units Subcutaneous TID WC  . insulin aspart  0-5 Units Subcutaneous QHS  . insulin aspart  4 Units Subcutaneous TID WC  . lisinopril  2.5 mg Oral Daily  . polyethylene glycol  17 g Oral Daily  . senna-docusate  1 tablet Oral BID  . simvastatin  40 mg Oral q1800  . tamsulosin  0.4 mg Oral QPC supper    Assessment/Plan necrotizing infection of the suprapubic region, right groin, right thigh, and right hemiscrotum; s/p debridement of necrotizing infection of the suprapubic region, right groin, right thigh, and right hemiscrotum  Culture shows a few enterococcus species  Surgeon: Manus Rudd K.,02/20/2013.  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 No hx of MI. He is able to work and daily activities do not seem impaired. (Current 2D shows EF 25-30%)  Hypertension  Hyperlipidemia  Body mass index is 40.7     Plan;  Continue dressing changes, I have ask him to look for someone aside from home health to help with dressing changes.  Hopefully he can go home  on wet to dry dressing changes in the next day or so. He is to try evening dressing change with PO pain med. Repeat labs in AM     LOS: 9 days    Trevor Rodriguez,Trevor Rodriguez 03/01/2013

## 2013-03-01 NOTE — Progress Notes (Signed)
I have examined and agree with Trevor Rodriguez note.

## 2013-03-01 NOTE — Progress Notes (Signed)
Physical Therapy Treatment Patient Details Name: Trevor Rodriguez MRN: [id number] DOB: 09-22-1941 Today's Date: 03/01/2013 Time: 9562-1308 PT Time Calculation (min): 32 min  PT Assessment / Plan / Recommendation Comments on Treatment Session  Pt was pleasant and willing to participate in PT session.  Pt increased ambulation distance.  Pt educated on stairs.    Follow Up Recommendations  Home health PT;Supervision/Assistance - 24 hour     Does the patient have the potential to tolerate intense rehabilitation     Barriers to Discharge        Equipment Recommendations  Rolling walker with 5" wheels    Recommendations for Other Services    Frequency Min 3X/week   Plan Discharge plan remains appropriate;Frequency remains appropriate    Precautions / Restrictions Precautions Precautions: Fall Precaution Comments: scrotal edema Restrictions Weight Bearing Restrictions: No   Pertinent Vitals/Pain Pt denies pain.      Mobility  Bed Mobility Bed Mobility: Not assessed Transfers Transfers: Sit to Stand;Stand to Sit Sit to Stand: 5: Supervision;With upper extremity assist;From bed Stand to Sit: 5: Supervision;With upper extremity assist;To chair/3-in-1 Ambulation/Gait Ambulation/Gait Assistance: 4: Min guard Ambulation Distance (Feet): 250 Feet Assistive device: Rolling walker Ambulation/Gait Assistance Details: (A) with turning RW.  Cues for upright posture, looking straight ahead & to stay within RW. Gait Pattern: Step-through pattern;Decreased step length - right;Decreased step length - left;Trunk flexed Gait velocity: decreased Stairs: Yes Stairs Assistance: 3: Mod assist Stairs Assistance Details (indicate cue type and reason): 3 stairs 2x's.  (A) for support and safety.  Cues to technique. Stair Management Technique: No rails;Other (comment);Step to pattern;Forwards (handheld support) Number of Stairs: 3 Wheelchair Mobility Wheelchair Mobility: No    Exercises     PT  Diagnosis:    PT Problem List:   PT Treatment Interventions:     PT Goals Acute Rehab PT Goals Pt will go Supine/Side to Sit: with modified independence;with HOB 0 degrees PT Goal: Supine/Side to Sit - Progress: Progressing toward goal Pt will go Sit to Stand: with supervision;with upper extremity assist PT Goal: Sit to Stand - Progress: Progressing toward goal Pt will go Stand to Sit: with supervision;with upper extremity assist PT Goal: Stand to Sit - Progress: Progressing toward goal Pt will Ambulate: 51 - 150 feet;with supervision;with least restrictive assistive device PT Goal: Ambulate - Progress: Progressing toward goal Pt will Go Up / Down Stairs: 1-2 stairs;with min assist;with rail(s) PT Goal: Up/Down Stairs - Progress: Progressing toward goal  Visit Information  Last PT Received On: 03/01/13 Assistance Needed: +1    Subjective Data  Subjective: Pt indicated he was feeling ok with no reported pain, but had not slept well.   Cognition  Cognition Overall Cognitive Status: Appears within functional limits for tasks assessed/performed Arousal/Alertness: Awake/alert Orientation Level: Appears intact for tasks assessed Behavior During Session: [facility] for tasks performed    Balance     End of Session PT - End of Session Equipment Utilized During Treatment: Gait belt Activity Tolerance: Patient tolerated treatment well Patient left: in chair;with call bell/phone within reach Nurse Communication: Mobility status   GP     Enid Baas, SPTA 03/01/2013, 1:02 PM

## 2013-03-01 NOTE — Progress Notes (Signed)
Rn demonstrated dressing change with daughter Trevor Rodriguez present. She did most of the dressing change and felt comfortable with it. Patient tolerated dressing change well on oral pain medications. Trevor Rodriguez is going to come back in the morning to watch last dressing change before possible dc.

## 2013-03-01 NOTE — Progress Notes (Signed)
[facility] CONSULT PROGRESS NOTE  Santiago Stenzel JYN:[id number] DOB: [birth year] DOA: 02/20/2013 PCP: Eino Farber, MD  Brief narrative:  72 yo male who presented with one week of worsening swelling and erythema in his suprapubic region. This apparently was drained in the office by his primary care physician, but the swelling and pain worsened and extended down into his scrotum and thigh.  Pt was taken to OR on 02/20/13 for debridement of lower abdominal wall, suprapubic region, scrotum and R thigh 2/2 worsening infection.   Assessment/Plan: Fournier's gangrene  Per primary service.  HTN  Stable.  New diagnosis of type 2 diabetes Was not medicated at home and no known hx of DM.  Hemoglobin A1c 6.5, qualifies for diagnosis of DM. Educate patient, CBG currently well controlled, transition to metformin 500 mg po bid at the time of discharge.  HLD  Continue home statin   Hx of severe systolic CHF  2-D echocardiogram on 02/21/2013 showed EF of 25-30%, diffuse hypokinesis.  Improved from previous 2-D echocardiogram. Continue home coreg and lasix in the setting of well compensated CHF. Continue ACE as renal function is holding steady (dual indication w/ DM).  Ongoing tobacco abuse  Cessation counseled   BPH  Continue home flomax dose   Morbid obesity  Body mass index is 40.7   Leukocytosis Improved, likely due to the above infection. Stable.  Prophylaxis Lovenox.  Code Status: Full code. Family Communication: No family present. Disposition Plan: Per primary service.  Procedures:  As above.  Antibiotics:  Vancomycin 02/20/2013 >> 02/28/2013  Zosyn 02/20/2013 >> 02/28/2013  Augmentin 02/28/2013 >>  Will sign off please do not hesitate to contact use with any questions or concerns. Thank you for the consult.  HPI/Subjective: No specific concerns. Hoping for discharge soon. Pain under control.  Objective: Filed Vitals:   02/28/13 1335 02/28/13 1517 02/28/13  2059 03/01/13 0626  BP: 117/57 120/53 127/57 107/50  Pulse: 78 80 80 75  Temp: 97.8 F (36.6 C) 97.6 F (36.4 C) 98.2 F (36.8 C) 98.2 F (36.8 C)  TempSrc: Oral Oral Oral Oral  Resp: 20 20 18 18   Height:      Weight:    108.5 kg (239 lb 3.2 oz)  SpO2: 100% 100% 99% 97%    Intake/Output Summary (Last 24 hours) at 03/01/13 1146 Last data filed at 03/01/13 0946  Gross per 24 hour  Intake 812.17 ml  Output   2350 ml  Net -1537.83 ml   Filed Weights   02/27/13 0716 02/28/13 0621 03/01/13 0626  Weight: 109.5 kg (241 lb 6.5 oz) 109.7 kg (241 lb 13.5 oz) 108.5 kg (239 lb 3.2 oz)    Exam: Physical Exam: General: Awake, Oriented, No acute distress. HEENT: EOMI. Neck: Supple CV: S1 and S2 Lungs: Clear to ascultation bilaterally Abdomen: Soft, Nontender, Nondistended, +bowel sounds. Ext: Good pulses. Trace edema.  Data Reviewed: Basic Metabolic Panel:  Recent Labs Lab 02/25/13 0435 02/26/13 0546 02/27/13 0545 02/28/13 0650 03/01/13 0555  NA 132* 131* 130* 133* 132*  K 4.7 5.1 4.8 5.0 5.0  CL 98 98 96 100 99  CO2 25 23 24 25 25   GLUCOSE 145* 99 136* 107* 89  BUN 11 11 17 17 18   CREATININE 0.81 0.86 0.99 0.95 0.90  CALCIUM 9.1 9.4 8.8 8.9 9.4   Liver Function Tests: No results found for this basename: AST, ALT, ALKPHOS, BILITOT, PROT, ALBUMIN,  in the last 168 hours No results found for this basename: LIPASE, AMYLASE,  in the last 168 hours No results found for this basename: AMMONIA,  in the last 168 hours CBC:  Recent Labs Lab 02/25/13 0435 02/26/13 0546 02/27/13 0545 02/28/13 0650 03/01/13 0555  WBC 13.0* 12.3* 9.8 7.2 6.2  HGB 10.7* 11.3* 10.7* 10.0* 10.1*  HCT 31.1* 33.6* 31.2* 30.0* 29.8*  MCV 88.1 88.9 89.4 88.8 87.4  PLT 337 414* 395 402* 439*   Cardiac Enzymes: No results found for this basename: CKTOTAL, CKMB, CKMBINDEX, TROPONINI,  in the last 168 hours BNP (last 3 results) No results found for this basename: PROBNP,  in the last 8760  hours CBG:  Recent Labs Lab 02/28/13 1200 02/28/13 1717 02/28/13 2129 02/28/13 2201 03/01/13 0740  GLUCAP 105* 112* 103* 102* 97    Recent Results (from the past 240 hour(s))  ANAEROBIC CULTURE     Status: None   Collection Time    02/20/13  8:00 AM      Result Value Range Status   Specimen Description ABSCESS RIGHT LEG   Final   Special Requests PATIENT ON FOLLOWING ZOSYN VANCOMYCIN   Final   Gram Stain     Final   Value: FEW WBC PRESENT,BOTH PMN AND MONONUCLEAR     RARE SQUAMOUS EPITHELIAL CELLS PRESENT     FEW GRAM POSITIVE COCCI IN CLUSTERS     IN PAIRS   Culture     Final   Value: FEW PREVOTELLA SPECIES     Note: BETA LACTAMASE POSITIVE   Report Status 02/27/2013 FINAL   Final  CULTURE, ROUTINE-ABSCESS     Status: None   Collection Time    02/20/13  8:00 AM      Result Value Range Status   Specimen Description ABSCESS RIGHT LEG   Final   Special Requests PATIENT ON FOLLOWING ZOSYN VANCOMYCIN   Final   Gram Stain     Final   Value: FEW WBC PRESENT,BOTH PMN AND MONONUCLEAR     RARE SQUAMOUS EPITHELIAL CELLS PRESENT     FEW GRAM POSITIVE COCCI IN CLUSTERS     IN PAIRS   Culture FEW ENTEROCOCCUS SPECIES   Final   Report Status 02/23/2013 FINAL   Final   Organism ID, Bacteria ENTEROCOCCUS SPECIES   Final  MRSA PCR SCREENING     Status: None   Collection Time    02/20/13  2:24 PM      Result Value Range Status   MRSA by PCR NEGATIVE  NEGATIVE Final   Comment:            The GeneXpert MRSA Assay (FDA     approved for NASAL specimens     only), is one component of a     comprehensive MRSA colonization     surveillance program. It is not     intended to diagnose MRSA     infection nor to guide or     monitor treatment for     MRSA infections.     Studies: No results found.  Scheduled Meds: . amoxicillin-clavulanate  1 tablet Oral Q12H  . aspirin  325 mg Oral Daily  . carvedilol  6.25 mg Oral BID WC  . enoxaparin  40 mg Subcutaneous Q24H  . furosemide   20 mg Oral QODAY  . insulin aspart  0-15 Units Subcutaneous TID WC  . insulin aspart  0-5 Units Subcutaneous QHS  . insulin aspart  4 Units Subcutaneous TID WC  . lisinopril  2.5 mg Oral Daily  . polyethylene glycol  17 g Oral Daily  . senna-docusate  1 tablet Oral BID  . simvastatin  40 mg Oral q1800  . tamsulosin  0.4 mg Oral QPC supper   Continuous Infusions: . 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 If 7PM-7AM, please contact night-coverage at [email or web address], password [facility] 03/01/2013, 11:46 AM  LOS: 9 days

## 2013-03-01 NOTE — Progress Notes (Signed)
Pt was sitting up in chair when arrived. His son, daughter, granddaughter and family friend were bedside. He looked distressed as compared to my previous visits. Pt said he wanted me to pray for him. His family joined [place].  Pt and family were thankful for visit and prayer. Marjory Lies Chaplain

## 2013-03-02 LAB — BASIC METABOLIC PANEL
CO2: 23 mEq/L (ref 19–32)
Calcium: 9.3 mg/dL (ref 8.4–10.5)
Creatinine, Ser: 0.86 mg/dL (ref 0.50–1.35)
GFR calc non Af Amer: 85 mL/min — ABNORMAL LOW (ref >90)
Sodium: 133 mEq/L — ABNORMAL LOW (ref 135–145)

## 2013-03-02 LAB — CBC
MCH: 29.6 pg (ref 26.0–34.0)
MCHC: 34 g/dL (ref 30.0–36.0)
MCV: 87.2 fL (ref 78.0–100.0)
Platelets: 423 10*3/uL — ABNORMAL HIGH (ref 150–400)

## 2013-03-02 MED ORDER — OXYCODONE-ACETAMINOPHEN 5-325 MG PO TABS: 1.0000 | ORAL_TABLET | ORAL | Status: DC | PRN

## 2013-03-02 MED ORDER — AMOXICILLIN-POT CLAVULANATE 875-125 MG PO TABS: 1.0000 | ORAL_TABLET | Freq: Two times a day (BID) | ORAL | Status: DC

## 2013-03-02 NOTE — Progress Notes (Signed)
Patient discharged to home with instructions, verbalized understanding. 

## 2013-03-02 NOTE — Discharge Summary (Signed)
[facility] Surgery DISCHARGE SUMMARY Service Pager: [phone]  Patient name: Trevor Rodriguez Medical record number: [id number] Date of birth: [birth year] Age: 72 y.o. Gender: male  Primary Care Provider: Eino Farber, MD Attending Physician: Bishop Limbo, MD Consultants: Hospitalists  Dates of Hospitalization:  02/20/2013 to 03/02/13 Length of Stay: 10 days  Admission Diagnoses  Fournier's gangrene  Discharge Diagnoses   Active Hospital Problems   Diagnosis Date Noted  . 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 Type II or unspecified type diabetes mellitus without mention of complication, not stated as uncontrolled 02/22/2013  . CHF (congestive heart failure) 02/20/2013  . Hyperglycemia 02/20/2013  . Essential hypertension, benign 02/20/2013  . Other and unspecified hyperlipidemia 02/20/2013    Resolved Hospital Problems   Diagnosis Date Noted Date Resolved  No resolved problems to display.   Patient Active Problem List   Diagnosis Date Noted  . 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 Type II or unspecified type diabetes mellitus without mention of complication, not stated as uncontrolled 02/22/2013  . CHF (congestive heart failure) 02/20/2013  . Hyperglycemia 02/20/2013  . Essential hypertension, benign 02/20/2013  . Other and unspecified hyperlipidemia 02/20/2013    Discharge Medications:     Medication List    TAKE these medications       amoxicillin-clavulanate 875-125 MG per tablet  Commonly known as:  AUGMENTIN  Take 1 tablet by mouth 2 (two) times daily.     aspirin 325 MG tablet  Take 325 mg by mouth daily.     carvedilol 6.25 MG tablet  Commonly known as:  COREG  TAKE ONE TABLET BY MOUTH TWICE DAILY WITH MEALS     furosemide 20 MG tablet  Commonly known as:  LASIX  TAKE ONE TABLET BY MOUTH EVERY DAY     lisinopril 2.5 MG tablet  Commonly known as:   PRINIVIL,ZESTRIL  Take 1 tablet (2.5 mg total) by mouth daily.     metFORMIN 500 MG tablet  Commonly known as:  GLUCOPHAGE  Take 1 tablet (500 mg total) by mouth 2 (two) times daily with a meal.     oxyCODONE-acetaminophen 5-325 MG per tablet  Commonly known as:  PERCOCET/ROXICET  Take 1-2 tablets by mouth every 4 (four) hours as needed.     simvastatin 40 MG tablet  Commonly known as:  ZOCOR  TAKE ONE TABLET BY MOUTH EVERY DAY     tamsulosin 0.4 MG Caps  Commonly known as:  FLOMAX  TAKE ONE CAPSULE BY MOUTH EVERY DAY        Discharge Information  Disposition: to home with home health Discharge Diet: Resume diet Discharge Condition:  Improved Discharge Activity: Ad lib  Pending Results: none   Future Appointments Provider Department Dept Phone   03/07/2013 10:40 AM Wilmon Arms. Corliss Skains, MD [facility], [place] [phone]     Follow-up Information   Follow up with [facility] Minda Meo, MD. Schedule an appointment as soon as possible for a visit in 1 week.   Contact information:   [address] [place] [address] [phone]       Follow up with Wynona Luna., MD On 03/07/2013. (@ 1030)    Contact information:   [address] [place] [address] [phone]       Objective Findings on day of Discharge   Vitals: Temp:  [97.3 F (36.3 C)-98.3 F (36.8  C)] 97.3 F (36.3 C) (03/12 0519) Pulse Rate:  [74-83] 83 (03/12 0519) Resp:  [18] 18 (03/12 0519) BP: (118-129)/(56-63) 129/63 mmHg (03/12 0519) SpO2:  [98 %] 98 % (03/12 0519)  Weight: Wt Readings from Last 3 Encounters:  03/01/13 239 lb 3.2 oz (108.5 kg)  03/01/13 239 lb 3.2 oz (108.5 kg)    I&Os: Yesterday: 03/11 0701 - 03/12 0700 In: 840 [P.O.:840] Out: 1400 [Urine:1400] This shift: Total I/O In: 120 [P.O.:120] Out: 300 [Urine:300]   PE: GENERAL:  Adult obese AA  male. In no discomfort; no respiratory distress. Wound: Dressign clean, dry and intact.  R  inguinal tract to 8.5cm; L inguinal tract to 8cm; R gluteal fold/hemiscrotum to 3cm.  Good granulation tissue, improve induration, minimal erythema   LABS: CBC BMET   Recent Labs Lab 02/28/13 0650 03/01/13 0555 03/02/13 0654  WBC 7.2 6.2 6.7  HGB 10.0* 10.1* 10.6*  HCT 30.0* 29.8* 31.2*  PLT 402* 439* 423*    Recent Labs Lab 02/28/13 0650 03/01/13 0555 03/02/13 0654  NA 133* 132* 133*  K 5.0 5.0 5.1  CL 100 99 99  CO2 25 25 23   BUN 17 18 19   CREATININE 0.95 0.90 0.86  GLUCOSE 107* 89 107*  CALCIUM 8.9 9.4 9.3      02/21/2013 04:36  Cholesterol 99  Triglycerides 148  HDL 5 (L)  LDL (calc) 64  VLDL 30  Total CHOL/HDL Ratio 19.8    02/21/2013 04:36  Hemoglobin A1C 6.5 (H)     MICRO: 02/20/13 - Wound Culture:  Anaerobic:     FEW PREVOTELLA SPECIES   Note: BETA LACTAMASE POSITIVE   Routine: Enterococcus - Vanco and Ampicillin sensitive   IMAGING: Ct Abdomen Pelvis W Contrast  02/20/2013  *RADIOLOGY REPORT*  Clinical Data: Pelvic area induration, erythema, and drainage.  CT ABDOMEN AND PELVIS WITH CONTRAST  Technique:  Multidetector CT imaging of the abdomen and pelvis was performed following the standard protocol during bolus administration of intravenous contrast.  Contrast: 80mL OMNIPAQUE IOHEXOL 300 MG/ML  SOLN  Comparison: None.  Findings: Bibasilar atelectasis.  Cardiomegaly.  Coronary artery calcification.  Lobular hepatic contour.  No focal abnormality.  Question layering gallstones or sludge.  No biliary ductal dilatation.  Unremarkable spleen, pancreas, adrenal glands.  There are bilateral cystic renal lesions, measuring water attenuation within the larger lesion on the left.  The other lesions are incompletely characterized.  No hydronephrosis or hydroureter.  No CT evidence for colitis.  Normal appendix.  Small bowel loops are normal course and caliber.  Small hiatal hernia.  No free intraperitoneal air or fluid. Prominent bilateral inguinal lymph nodes. Enlarged  right common iliac chain lymph node measuring 12 mm short axis. Presumably, these are reactive.  There is scattered atherosclerotic calcification of the aorta and its branches. No aneurysmal dilatation.  Thin-walled bladder.  There is subcutaneous fat stranding and gas extending along the right inguinal region, perineum, right hemi scrotum, and right medial thigh.  Scrotal wall edema.  Bilateral hydroceles.  Prominent pampiniform plexus.  Diffuse SI joints. Multilevel degenerative changes.  Bridging anterior osteophytes.  IMPRESSION: Subcutaneous fat stranding and gas extends along the right inguinal region, perineum, right hemiscrotum, and right medial thigh, where it abuts the medial musculature.  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  Scrotal wall edema.  Bilateral hydroceles. Epididymoorchitis not excluded.  Incompletely characterized renal lesions.  Consider non emergent ultrasound follow-up.  Critical Value/emergent results were called by telephone at the time of interpretation on 02/20/2013 at  06:50 a.m. to Dr. Ranae Palms, who verbally acknowledged these results.   Original Report Authenticated By: Jearld Lesch, M.D.    Dg Chest Port 1 View  02/20/2013  *RADIOLOGY REPORT*  Clinical Data: Preop.  PORTABLE CHEST - 1 VIEW  Comparison: 07/14/2010  Findings: Cardiomegaly with vascular congestion.  Bibasilar atelectasis.  No visible effusions or acute bony abnormality.  IMPRESSION: Cardiomegaly with vascular congestion and bibasilar atelectasis.   Original Report Authenticated By: Charlett Nose, M.D.     Medications:   . 0.45 % NaCl with KCl 20 mEq / L 10 mL/hr (02/26/13 0149)   . amoxicillin-clavulanate  1 tablet Oral Q12H  . aspirin  325 mg Oral Daily  . carvedilol  6.25 mg Oral BID WC  . enoxaparin  40 mg Subcutaneous Q24H  . furosemide  20 mg Oral QODAY  . insulin aspart  0-15 Units Subcutaneous TID WC  . insulin aspart  0-5 Units Subcutaneous QHS  . insulin aspart  4 Units Subcutaneous TID WC  .  lisinopril  2.5 mg Oral Daily  . polyethylene glycol  17 g Oral Daily  . senna-docusate  1 tablet Oral BID  . simvastatin  40 mg Oral q1800  . tamsulosin  0.4 mg Oral QPC supper   hydrALAZINE, HYDROmorphone (DILAUDID) injection, ondansetron (ZOFRAN) IV, ondansetron, oxyCODONE-acetaminophen  Assessment & Plan  LOS: 96 72 y.o. year old male with Fouriner's Gangrene: # necrotizing infection of the suprapubic region, right groin, right thigh, and right hemiscrotum; s/p debridement of necrotizing infection of the suprapubic region, right groin, right thigh, and right hemiscrotum Surgeon: TSUEI,MATTHEW K.,02/20/2013.   Was initially on Emperic VANCO and ZOSYN   Culture shows a few enterococcus species  - narrowed to po Augmentin   Wet to dry dressing changes daily.  Needs continued changes  # Severe Systolic Congestive Heart Failure with EF of 25-30% improved from EF 10% - 2o/2 hypertensive Cardiomyopathy and not taking meds. No hx of MI. He is able to work and daily activities do not seem impaired. # Hypertension  # Hyperlipidemia  # Obesity - Body mass index is 40.7  # Leukocytosis - improved #BPH: home flomax #Tobacco Abuse: cessation counseling #Newly Dx DM:  A1c 6.5 Inpatient hospitalization.  Started Metformin 500mg  at time of discharge.  Follow up with PCP  Disposition  Discharge to home with HHRN, HHPT, HHOT.   Daily Wet to dry dressings, HHRN and daughter has been trained. Scheduled follow up appointment with Dr. Corliss Skains for in 2 weeks. Monday at 1030. > 14 additional days of antibiotics provided.  Needs re-evaluation  Andrena Mews, DO [facility] Resident - PGY-2  03/02/2013 12:44 PM

## 2013-03-02 NOTE — Progress Notes (Signed)
Physical Therapy Treatment Patient Details Name: Trevor Rodriguez MRN: [id number] DOB: [birth year] Today's Date: 03/02/2013 Time: 1000-1023 PT Time Calculation (min): 23 min  PT Assessment / Plan / Recommendation Comments on Treatment Session  Pt has progressed well and could potentially go home today. He felt good about steps yesterday and is tolerating ambulation well. Discussed activity progression at home. Recommend RW for home. PT will continue to follow if he remains.    Follow Up Recommendations  Home health PT;Supervision/Assistance - 24 hour     Does the patient have the potential to tolerate intense rehabilitation     Barriers to Discharge        Equipment Recommendations  Rolling walker with 5" wheels    Recommendations for Other Services    Frequency Min 3X/week   Plan Discharge plan remains appropriate;Frequency remains appropriate    Precautions / Restrictions Precautions Precautions: Fall Precaution Comments: scrotal edema Restrictions Weight Bearing Restrictions: No   Pertinent Vitals/Pain 5/10 scrotal pain, meds given    Mobility  Bed Mobility Bed Mobility: Supine to Sit;Sitting - Scoot to Edge of Bed Supine to Sit: 6: Modified independent (Device/Increase time);HOB flat Sitting - Scoot to Edge of Bed: 6: Modified independent (Device/Increase time) Details for Bed Mobility Assistance: pt able to roll over and get to side of bed without physical assist. Transfers Transfers: Sit to Stand;Stand to Sit Sit to Stand: 6: Modified independent (Device/Increase time);From bed;With upper extremity assist Stand to Sit: 6: Modified independent (Device/Increase time);To chair/3-in-1;With upper extremity assist Details for Transfer Assistance: proper hand placement noted, good control with sitting Ambulation/Gait Ambulation/Gait Assistance: 5: Supervision Ambulation Distance (Feet): 250 Feet Assistive device: Rolling walker;None Ambulation/Gait Assistance Details: pt  using RW effectively and he feels that it helps control pain, pt coninues to ambulate with wide stance due to scrotal edema and discomfort. Encouraged to increase pace with RW and vc's to push it instead of picking it up. Last 25', pt used no AD and was still safe but ambulated with significantly increased sway and felt less confident.  Gait Pattern: Step-through pattern;Wide base of support Gait velocity: decreased General Gait Details: verbal cues for upright posture.   Stairs: No Wheelchair Mobility Wheelchair Mobility: No    Exercises     PT Diagnosis:    PT Problem List:   PT Treatment Interventions:     PT Goals Acute Rehab PT Goals PT Goal Formulation: With patient Time For Goal Achievement: 03/08/13 Potential to Achieve Goals: Good Pt will go Supine/Side to Sit: with modified independence;with HOB 0 degrees PT Goal: Supine/Side to Sit - Progress: Met Pt will go Sit to Supine/Side: with modified independence;with HOB 0 degrees PT Goal: Sit to Supine/Side - Progress: Progressing toward goal Pt will go Sit to Stand: with supervision;with upper extremity assist PT Goal: Sit to Stand - Progress: Met Pt will go Stand to Sit: with supervision;with upper extremity assist PT Goal: Stand to Sit - Progress: Met Pt will Transfer Bed to Chair/Chair to Bed: with supervision PT Transfer Goal: Bed to Chair/Chair to Bed - Progress: Progressing toward goal Pt will Ambulate: 51 - 150 feet;with least restrictive assistive device;with modified independence PT Goal: Ambulate - Progress: Updated due to goal met Pt will Go Up / Down Stairs: 1-2 stairs;with min assist;with rail(s) PT Goal: Up/Down Stairs - Progress: Met  Visit Information  Last PT Received On: 03/02/13 Assistance Needed: +1    Subjective Data  Subjective: pt feeling good today Patient Stated Goal: to go home  Cognition  Cognition Arousal/Alertness: Awake/alert Orientation Level: Appears intact for tasks  assessed Behavior During Session: [facility] for tasks performed    Balance  Balance Balance Assessed: Yes Dynamic Standing Balance Dynamic Standing - Balance Support: No upper extremity supported;During functional activity Dynamic Standing - Level of Assistance: 6: Modified independent (Device/Increase time)  End of Session PT - End of Session Equipment Utilized During Treatment: Other (comment) (none needed) Activity Tolerance: Patient tolerated treatment well Patient left: in chair;with call bell/phone within reach Nurse Communication: Other (comment) (request for bath)   GP   Lyanne Co, PT  Acute Rehab Services  [phone]   Lyanne Co 03/02/2013, 10:31 AM

## 2013-03-03 LAB — GLUCOSE, CAPILLARY: Glucose-Capillary: 101 mg/dL — ABNORMAL HIGH (ref 70–99)

## 2013-03-04 ENCOUNTER — Telehealth (INDEPENDENT_AMBULATORY_CARE_PROVIDER_SITE_OTHER): Payer: Self-pay | Admitting: *Deleted

## 2013-03-04 NOTE — Telephone Encounter (Signed)
Misty Stanley with Home Health called to request an order for a nurse aide to help patient with ADLs.  Spoke to Tsuei MD who ok'd.  Misty Stanley updated with order.

## 2013-03-07 ENCOUNTER — Ambulatory Visit (INDEPENDENT_AMBULATORY_CARE_PROVIDER_SITE_OTHER): Payer: Medicare HMO | Admitting: Surgery

## 2013-03-07 ENCOUNTER — Encounter (INDEPENDENT_AMBULATORY_CARE_PROVIDER_SITE_OTHER): Payer: Self-pay | Admitting: Surgery

## 2013-03-07 VITALS — BP 120/68 | HR 107 | Temp 97.1°F | Ht 66.0 in | Wt 231.0 lb

## 2013-03-07 DIAGNOSIS — N493 Fournier gangrene: Secondary | ICD-10-CM

## 2013-03-07 NOTE — Progress Notes (Signed)
Status post emergent debridement of a necrotizing soft tissue infection of the suprapubic region and the right thigh. This performed on 02/20/13. The patient responded nicely and was discharged home On 03/02/13. He comes in today for a wound check. Home health is assisting with daily wet to dry dressings. His right thigh incision is very clean and well granulated. It is about 4 x 1 cm with minimal depth. The suprapubic region is also granulating very nicely. It is about 5 x 4 x 2 cm. Continue with daily wet-to-dry dressings. Followup in 3 weeks.  Wilmon Arms. Corliss Skains, MD, [facility] Surgery  03/07/2013 11:44 AM

## 2013-03-31 ENCOUNTER — Encounter (INDEPENDENT_AMBULATORY_CARE_PROVIDER_SITE_OTHER): Payer: Self-pay | Admitting: Surgery

## 2013-03-31 ENCOUNTER — Ambulatory Visit (INDEPENDENT_AMBULATORY_CARE_PROVIDER_SITE_OTHER): Payer: Medicare HMO | Admitting: Surgery

## 2013-03-31 VITALS — BP 128/80 | HR 68 | Temp 97.3°F | Resp 12 | Ht 66.0 in | Wt 234.8 lb

## 2013-03-31 DIAGNOSIS — N493 Fournier gangrene: Secondary | ICD-10-CM

## 2013-03-31 DIAGNOSIS — N501 Vascular disorders of male genital organs: Secondary | ICD-10-CM

## 2013-03-31 NOTE — Progress Notes (Signed)
His wounds are almost completely healed. A small suprapubic wound is just a thin strip of granulation tissue which is very clean. No sign of infection. He can continue with dressing changes until completely healed. In his right groin there is only a small pinpoint of granulation tissue which was cauterized with silver nitrate appear dry dressing is applied. These do not heal within a week or two, the patient will call spike for followup appointment. Otherwise followup when necessary  Trevor Rodriguez. Trevor Skains, MD, [facility] Surgery  03/31/2013 9:14 AM

## 2014-08-18 ENCOUNTER — Encounter: Payer: Self-pay | Admitting: Pulmonary Disease

## 2014-12-12 ENCOUNTER — Ambulatory Visit: Payer: Medicare HMO | Admitting: Family Medicine

## 2015-10-02 ENCOUNTER — Encounter (HOSPITAL_COMMUNITY): Payer: Self-pay | Admitting: Emergency Medicine

## 2015-10-02 ENCOUNTER — Emergency Department (HOSPITAL_COMMUNITY)
Admission: EM | Admit: 2015-10-02 | Discharge: 2015-10-02 | Disposition: A | Discharge status: Home / Self Care | Payer: Medicare HMO | Source: Home / Self Care | Attending: Emergency Medicine | Admitting: Emergency Medicine

## 2015-10-02 DIAGNOSIS — M722 Plantar fascial fibromatosis: Secondary | ICD-10-CM | POA: Diagnosis not present

## 2015-10-02 DIAGNOSIS — M76891 Other specified enthesopathies of right lower limb, excluding foot: Secondary | ICD-10-CM

## 2015-10-02 DIAGNOSIS — M6580 Other synovitis and tenosynovitis, unspecified site: Secondary | ICD-10-CM

## 2015-10-02 DIAGNOSIS — M7041 Prepatellar bursitis, right knee: Secondary | ICD-10-CM

## 2015-10-02 MED ORDER — PREDNISONE 20 MG PO TABS: ORAL_TABLET | ORAL | Status: DC

## 2015-10-02 MED ORDER — TRAMADOL HCL 50 MG PO TABS: 50.0000 mg | ORAL_TABLET | Freq: Four times a day (QID) | ORAL | Status: DC | PRN

## 2015-10-02 NOTE — ED Provider Notes (Signed)
CSN: [id number]     Arrival date & time 10/02/15  1359 History   First MD Initiated Contact with Patient 10/02/15 1521     Chief Complaint  Patient presents with  . Knee Pain   (Consider location/radiation/quality/duration/timing/severity/associated sxs/prior Treatment) HPI Comments: 74 year old male complaining of pain in the right knee for 3 days. No known knee injury. The onset was gradual and insidious. Pain is located to the right lateral aspect of the knee as well has the anterior portion of the knee. Pain is worse with weightbearing and ambulation.  His second concern is that of pain to the plantar aspect of the right heel. It is worse with standing particularly after sitting or supine for a period of times. It to begin approximate 3 days ago. He has a history of heel spur and states it feels similar to that in which she was previously diagnosed with what sounds like plantar fasciitis. He also states he has a history of arthritis. He is taking Advil without relief.   Past Medical History  Diagnosis Date  . Congestive heart failure (HCC)     Ejection fraction around 15%. He was recently horpitalized for heart failure and had a heart catheterization. He had minor luminal irregularities.  . Hypertension   . Hyperlipidemia    Past Surgical History  Procedure Laterality Date  . Cardiac catheterization  2011  . Incision and drainage abscess N/A 02/20/2013    Procedure: Debridemnt of lower abdominal wall, suprapubic region, scrotum and right thigh;  Surgeon: Wilmon Arms. Corliss Skains, MD;  Location: [facility] OR;  Service: General;  Laterality: N/A;   Family History  Problem Relation Age of Onset  . Cancer Mother   . Heart disease Father    Social History  Substance Use Topics  . Smoking status: Former Smoker -- 0.50 packs/day    Types: Cigarettes    Quit date: 03/07/1993  . Smokeless tobacco: None  . Alcohol Use: No    Review of Systems  Constitutional: Positive for activity change. Negative  for fever and fatigue.  HENT: Negative.   Respiratory: Negative.   Cardiovascular: Positive for leg swelling. Negative for chest pain.  Gastrointestinal: Negative.   Genitourinary: Negative.   Musculoskeletal: Positive for arthralgias and gait problem. Negative for back pain and neck pain.  Skin: Negative for rash and wound.  Neurological: Negative.     Allergies  Ibuprofen  Home Medications   Prior to Admission medications   Medication Sig Start Date End Date Taking? Authorizing Provider  aspirin 325 MG tablet Take 325 mg by mouth daily.   Yes Historical Provider, MD  carvedilol (COREG) 6.25 MG tablet TAKE ONE TABLET BY MOUTH TWICE DAILY WITH MEALS 10/04/11  Yes Vesta Mixer, MD  furosemide (LASIX) 20 MG tablet TAKE ONE TABLET BY MOUTH EVERY DAY 10/04/11  Yes Vesta Mixer, MD  lisinopril (PRINIVIL,ZESTRIL) 2.5 MG tablet Take 1 tablet (2.5 mg total) by mouth daily. 02/28/13  Yes Cristal Ford, MD  metFORMIN (GLUCOPHAGE) 500 MG tablet Take 1 tablet (500 mg total) by mouth 2 (two) times daily with a meal. 02/28/13  Yes Cristal Ford, MD  oxyCODONE-acetaminophen (PERCOCET/ROXICET) 5-325 MG per tablet Take 1-2 tablets by mouth every 4 (four) hours as needed. 03/02/13  Yes Andrena Mews, MD  simvastatin (ZOCOR) 40 MG tablet TAKE ONE TABLET BY MOUTH EVERY DAY 11/05/11  Yes Vesta Mixer, MD  spironolactone (ALDACTONE) 25 MG tablet  01/24/13  Yes Historical Provider, MD  Tamsulosin HCl (  FLOMAX) 0.4 MG CAPS TAKE ONE CAPSULE BY MOUTH EVERY DAY 10/04/11  Yes Vesta Mixer, MD  amoxicillin-clavulanate (AUGMENTIN) 875-125 MG per tablet Take 1 tablet by mouth 2 (two) times daily. 03/02/13   Andrena Mews, MD  Blood Glucose Monitoring Suppl (RELION PRIME MONITOR) DEVI  03/28/13   Historical Provider, MD  predniSONE (DELTASONE) 20 MG tablet Take 3 tabs po on first day, 2 tabs second day, 2 tabs third day, 1 tab fourth day, 1 tab 5th day. Take with food. 10/02/15   Hayden Rasmussen, NP  RELION PRIME  TEST test strip  03/28/13   Historical Provider, MD  RELION ULTRA THIN PLUS LANCETS MISC  03/28/13   Historical Provider, MD  sulfamethoxazole-trimethoprim (BACTRIM DS) 800-160 MG per tablet  02/16/13   Historical Provider, MD  traMADol (ULTRAM) 50 MG tablet Take 1 tablet (50 mg total) by mouth every 6 (six) hours as needed. 10/02/15   Hayden Rasmussen, NP   Meds Ordered and Administered this Visit  Medications - No data to display  BP 125/64 mmHg  Pulse 76  Temp(Src) 99.1 F (37.3 C) (Oral)  Resp 18  SpO2 99% No data found.   Physical Exam  Constitutional: He is oriented to person, place, and time. He appears well-developed and well-nourished. No distress.  Neck: Normal range of motion. Neck supple.  Pulmonary/Chest: Effort normal. No respiratory distress.  Musculoskeletal: He exhibits edema and tenderness.  Right knee with minor swelling. There is tenderness over the anterior knee over the patella bursa. There is also tenderness to the right lateral femoral condyle. These are the 2 points of pain for which the patient states the pain originates. They wish in the right foot reveals tenderness to the plantar heel only. Dorsiflexion and plantar flexion is intact. It is noted that he has lower extremity edema 2+ nonpitting on the right and 1+ nonpitting on the left. Comparing both knees there is no increase in warmth or evidence of erythema or other discoloration.  Neurological: He is alert and oriented to person, place, and time. He exhibits normal muscle tone.  Skin: Skin is warm.  Nursing note and vitals reviewed.   ED Course  Procedures (including critical care time)  Labs Review Labs Reviewed - No data to display  Imaging Review No results found.   Visual Acuity Review  Right Eye Distance:   Left Eye Distance:   Bilateral Distance:    Right Eye Near:   Left Eye Near:    Bilateral Near:         MDM   1. Prepatellar bursitis, right   2. Tendinitis of right knee   3.  Plantar fasciitis    Due to the location of pain and tenderness I doubt the principal etiology is arthritis. He is instructed to use ice to the knee and bottom of the hill. He may freeze a can and rolled the heel on the frozen tan for cold relief. He is also instructed to obtain a full length insert for the foot with a high arch support. Due to his 15% ejection fraction will hold off on NSAIDs at this time and treat with prednisone and tramadol. Follow-up with PCP as needed. For any worsening, new symptoms or problems such as fever, increased pain, redness or additional swelling may return or go to the emergency department. May also need a referral to podiatrist for plantar fasciitis.    Hayden Rasmussen, NP 10/02/15 [phone]

## 2015-10-02 NOTE — Discharge Instructions (Signed)
Bursitis Bursitis is inflammation and irritation of a bursa, which is one of the small, fluid-filled sacs that cushion and protect the moving parts of your body. These sacs are located between bones and muscles, muscle attachments, or skin areas next to bones. A bursa protects these structures from the wear and tear that results from frequent movement. An inflamed bursa causes pain and swelling. Fluid may build up inside the sac. Bursitis is most common near joints, especially the knees, elbows, hips, and shoulders. CAUSES Bursitis can be caused by:   Injury from:  A direct blow, like falling on your knee or elbow.  Overuse of a joint (repetitive stress).  Infection. This can happen if bacteria gets into a bursa through a cut or scrape near a joint.  Diseases that cause joint inflammation, such as gout and rheumatoid arthritis. RISK FACTORS You may be at risk for bursitis if you:   Have a job or hobby that involves a lot of repetitive stress on your joints.  Have a condition that weakens your body's defense system (immune system), such as diabetes, cancer, or HIV.  Lift and reach overhead often.  Kneel or lean on hard surfaces often.  Run or walk often. SIGNS AND SYMPTOMS The most common signs and symptoms of bursitis are:  Pain that gets worse when you move the affected body part or put weight on it.  Inflammation.  Stiffness. Other signs and symptoms may include:  Redness.  Tenderness.  Warmth.  Pain that continues after rest.  Fever and chills. This may occur in bursitis caused by infection. DIAGNOSIS Bursitis may be diagnosed by:   Medical history and physical exam.  MRI.  A procedure to drain fluid from the bursa with a needle (aspiration). The fluid may be checked for signs of infection or gout.  Blood tests to rule out other causes of inflammation. TREATMENT  Bursitis can usually be treated at home with rest, ice, compression, and elevation (RICE). For  mild bursitis, RICE treatment may be all you need. Other treatments may include:  Nonsteroidal anti-inflammatory drugs (NSAIDs) to treat pain and inflammation.  Corticosteroids to fight inflammation. You may have these drugs injected into and around the area of bursitis.  Aspiration of bursitis fluid to relieve pain and improve movement.  Antibiotic medicine to treat an infected bursa.  A splint, brace, or walking aid.  Physical therapy if you continue to have pain or limited movement.  Surgery to remove a damaged or infected bursa. This may be needed if you have a very bad case of bursitis or if other treatments have not worked. HOME CARE INSTRUCTIONS   Take medicines only as directed by your health care provider.  If you were prescribed an antibiotic medicine, finish it all even if you start to feel better.  Rest the affected area as directed by your health care provider.  Keep the area elevated.  Avoid activities that make pain worse.  Apply ice to the injured area:  Place ice in a plastic bag.  Place a towel between your skin and the bag.  Leave the ice on for 20 minutes, 2-3 times a day.  Use splints, braces, pads, or walking aids as directed by your health care provider.  Keep all follow-up visits as directed by your health care provider. This is important. PREVENTION   Wear knee pads if you kneel often.  Wear sturdy running or walking shoes that fit you well.  Take regular breaks from repetitive activity.  Warm  up by stretching before doing any strenuous activity.  Maintain a healthy weight or lose weight as recommended by your health care provider. Ask your health care provider if you need help.  Exercise regularly. Start any new physical activity gradually. SEEK MEDICAL CARE IF:   Your bursitis is not responding to treatment or home care.  You have a fever.  You have chills.   This information is not intended to replace advice given to you by your  health care provider. Make sure you discuss any questions you have with your health care provider.   Document Released: 12/05/2000 Document Revised: 08/29/2015 Document Reviewed: 02/27/2014 Elsevier Interactive Patient Education 2016 [employer]  Plantar Fasciitis Plantar fasciitis is a painful foot condition that affects the heel. It occurs when the band of tissue that connects the toes to the heel bone (plantar fascia) becomes irritated. This can happen after exercising too much or doing other repetitive activities (overuse injury). The pain from plantar fasciitis can range from mild irritation to severe pain that makes it difficult for you to walk or move. The pain is usually worse in the morning or after you have been sitting or lying down for a while. CAUSES This condition may be caused by:  Standing for long periods of time.  Wearing shoes that do not fit.  Doing high-impact activities, including running, aerobics, and ballet.  Being overweight.  Having an abnormal way of walking (gait).  Having tight calf muscles.  Having high arches in your feet.  Starting a new athletic activity. SYMPTOMS The main symptom of this condition is heel pain. Other symptoms include:  Pain that gets worse after activity or exercise.  Pain that is worse in the morning or after resting.  Pain that goes away after you walk for a few minutes. DIAGNOSIS This condition may be diagnosed based on your signs and symptoms. Your health care provider will also do a physical exam to check for:  A tender area on the bottom of your foot.  A high arch in your foot.  Pain when you move your foot.  Difficulty moving your foot. You may also need to have imaging studies to confirm the diagnosis. These can include:  X-rays.  Ultrasound.  MRI. TREATMENT  Treatment for plantar fasciitis depends on the severity of the condition. Your treatment may include:  Rest, ice, and over-the-counter pain  medicines to manage your pain.  Exercises to stretch your calves and your plantar fascia.  A splint that holds your foot in a stretched, upward position while you sleep (night splint).  Physical therapy to relieve symptoms and prevent problems in the future.  Cortisone injections to relieve severe pain.  Extracorporeal shock wave therapy (ESWT) to stimulate damaged plantar fascia with electrical impulses. It is often used as a last resort before surgery.  Surgery, if other treatments have not worked after 12 months. HOME CARE INSTRUCTIONS  Take medicines only as directed by your health care provider.  Avoid activities that cause pain.  Roll the bottom of your foot over a bag of ice or a bottle of cold water. Do this for 20 minutes, 3-4 times a day.  Perform simple stretches as directed by your health care provider.  Try wearing athletic shoes with air-sole or gel-sole cushions or soft shoe inserts.  Wear a night splint while sleeping, if directed by your health care provider.  Keep all follow-up appointments with your health care provider. PREVENTION   Do not perform exercises or activities  that cause heel pain.  Consider finding low-impact activities if you continue to have problems.  Lose weight if you need to. The best way to prevent plantar fasciitis is to avoid the activities that aggravate your plantar fascia. SEEK MEDICAL CARE IF:  Your symptoms do not go away after treatment with home care measures.  Your pain gets worse.  Your pain affects your ability to move or do your daily activities.   This information is not intended to replace advice given to you by your health care provider. Make sure you discuss any questions you have with your health care provider.   Document Released: 09/02/2001 Document Revised: 08/29/2015 Document Reviewed: 10/18/2014 Elsevier Interactive Patient Education 2016 [employer]  Prepatellar Bursitis With Rehab  Bursitis is a  condition that is characterized by inflammation of a bursa. Trevor Rodriguez exists in many areas of the body. They are fluid-filled sacs that lie between a soft tissue (skin, tendon, or ligament) and a bone, and they reduce friction between the structures as well as the stress placed on the soft tissue. Prepatellar bursitis is inflammation of the bursa that lies between the skin and the kneecap (patella). This condition often causes pain over the patella. SYMPTOMS   Pain, tenderness, and/or inflammation over the patella.  Pain that worsens with movement of the knee joint.  Decreased range of motion for the knee joint.  A crackling sound (crepitation) when the bursa is moved or touched.  Occasionally, painless swelling of the bursa.  Fever (when infected). CAUSES  Bursitis is caused by damage to the bursa, which results in an inflammatory response. Common mechanisms of injury include:  Direct trauma to the front of the knee.  Repetitive and/or stressful use of the knee. RISK INCREASES WITH:  Activities in which kneeling and/or falling on one's knees is likely (volleyball or football).  Repetitive and stressful training, especially if it involves running on hills.  Improper training techniques, such as a sudden increase in the intensity, frequency, or duration of training.  Failure to warm up properly before activity.  Poor technique.  Artificial turf. PREVENTION   Avoid kneeling or falling on your knees.  Warm up and stretch properly before activity.  Allow for adequate recovery between workouts.  Maintain physical fitness:  Strength, flexibility, and endurance.  Cardiovascular fitness.  Learn and use proper technique. When possible, have a coach correct improper technique.  Wear properly fitted and padded protective equipment (knee pads). PROGNOSIS  If treated properly, then the symptoms of prepatellar bursitis usually resolve within 2 weeks. RELATED COMPLICATIONS    Recurrent symptoms that result in a chronic problem.  Prolonged healing time, if improperly treated or reinjured.  Limited range of motion.  Infection of bursa.  Chronic inflammation or scarring of bursa. TREATMENT  Treatment initially involves the use of ice and medication to help reduce pain and inflammation. The use of strengthening and stretching exercises may help reduce pain with activity, especially those of the quadriceps and hamstring muscles. These exercises may be performed at home or with referral to a therapist. Your caregiver may recommend knee pads when you return to playing sports, in order to reduce the stress on the prepatellar bursa. If symptoms persist despite treatment, then your caregiver may drain fluid out with a needle (aspirate) the bursa. If symptoms persist for greater than 6 months despite nonsurgical (conservative) treatment, then surgery may be recommended to remove the bursa.  MEDICATION  If pain medication is necessary, then nonsteroidal anti-inflammatory medications, such as  aspirin and ibuprofen, or other minor pain relievers, such as acetaminophen, are often recommended.  Do not take pain medication for 7 days before surgery.  Prescription pain relievers may be given if deemed necessary by your caregiver. Use only as directed and only as much as you need.  Corticosteroid injections may be given by your caregiver. These injections should be reserved for the most serious cases, because they may only be given a certain number of times. HEAT AND COLD  Cold treatment (icing) relieves pain and reduces inflammation. Cold treatment should be applied for 10 to 15 minutes every 2 to 3 hours for inflammation and pain and immediately after any activity that aggravates your symptoms. Use ice packs or massage the area with a piece of ice (ice massage).  Heat treatment may be used prior to performing the stretching and strengthening activities prescribed by your  caregiver, physical therapist, or athletic trainer. Use a heat pack or soak the injury in warm water. SEEK MEDICAL CARE IF:  Treatment seems to offer no benefit, or the condition worsens.  Any medications produce adverse side effects. EXERCISES RANGE OF MOTION (ROM) AND STRETCHING EXERCISES - Prepatellar Bursitis These exercises may help you when beginning to rehabilitate your injury. Your symptoms may resolve with or without further involvement from your physician, physical therapist or athletic trainer. While completing these exercises, remember:   Restoring tissue flexibility helps normal motion to return to the joints. This allows healthier, less painful movement and activity.  An effective stretch should be held for at least 30 seconds.  A stretch should never be painful. You should only feel a gentle lengthening or release in the stretched tissue. STRETCH - Hamstrings, Standing  Stand or sit and extend your right / left leg, placing your foot on a chair or foot stool  Keeping a slight arch in your low back and your hips straight forward.  Lead with your chest and lean forward at the waist until you feel a gentle stretch in the back of your right / left knee or thigh. (When done correctly, this exercise requires leaning only a small distance.)  Hold this position for __________ seconds. Repeat __________ times. Complete this stretch __________ times per day. STRETCH - Quadriceps, Prone   Lie on your stomach on a firm surface, such as a bed or padded floor.  Bend your right / left knee and grasp your ankle. If you are unable to reach, your ankle or pant leg, use a belt around your foot to lengthen your reach.  Gently pull your heel toward your buttocks. Your knee should not slide out to the side. You should feel a stretch in the front of your thigh and/or knee.  Hold this position for __________ seconds. Repeat __________ times. Complete this stretch __________ times per day.    STRETCH - Hamstrings/Adductors, V-Sit   Sit on the floor with your legs extended in a large "V," keeping your knees straight.  With your head and chest upright, bend at your waist reaching for your right foot to stretch your left adductors.  You should feel a stretch in your left inner thigh. Hold for __________ seconds.  Return to the upright position to relax your leg muscles.  Continuing to keep your chest upright, bend straight forward at your waist to stretch your hamstrings.  You should feel a stretch behind both of your thighs and/or knees. Hold for __________ seconds.  Return to the upright position to relax your leg muscles.  Repeat  steps 2 through 4. Repeat __________ times. Complete this exercise __________ times per day.  STRENGTHENING EXERCISES - Prepatellar Bursitis  These exercises may help you when beginning to rehabilitate your injury. They may resolve your symptoms with or without further involvement from your physician, physical therapist or athletic trainer. While completing these exercises, remember:  Muscles can gain both the endurance and the strength needed for everyday activities through controlled exercises.  Complete these exercises as instructed by your physician, physical therapist or athletic trainer. Progress the resistance and repetitions only as guided. STRENGTH - Quadriceps, Isometrics  Lie on your back with your right / left leg extended and your opposite knee bent.  Gradually tense the muscles in the front of your right / left thigh. You should see either your kneecap slide up toward your hip or increased dimpling just above the knee. This motion will push the back of the knee down toward the floor/mat/bed on which you are lying.  Hold the muscle as tight as you can without increasing your pain for __________ seconds.  Relax the muscles slowly and completely in between each repetition. Repeat __________ times. Complete this exercise __________  times per day.  STRENGTH - Quadriceps, Short Arcs   Lie on your back. Place a __________ inch towel roll under your knee so that the knee slightly bends.  Raise only your lower leg by tightening the muscles in the front of your thigh. Do not allow your thigh to rise.  Hold this position for __________ seconds. Repeat __________ times. Complete this exercise __________ times per day.  OPTIONAL ANKLE WEIGHTS: Begin with ____________________, but DO NOT exceed ____________________. Increase in1 lb/0.5 kg increments.  STRENGTH - Quadriceps, Straight Leg Raises  Quality counts! Watch for signs that the quadriceps muscle is working to insure you are strengthening the correct muscles and not "cheating" by substituting with healthier muscles.  Lay on your back with your right / left leg extended and your opposite knee bent.  Tense the muscles in the front of your right / left thigh. You should see either your kneecap slide up or increased dimpling just above the knee. Your thigh may even quiver.  Tighten these muscles even more and raise your leg 4 to 6 inches off the floor. Hold for __________ seconds.  Keeping these muscles tense, lower your leg.  Relax the muscles slowly and completely in between each repetition. Repeat __________ times. Complete this exercise __________ times per day.  STRENGTH - Quadriceps, Step-Ups   Use a thick book, step or step stool that is __________ inches tall.  Holding a wall or counter for balance only, not support.  Slowly step-up with your right / left foot, keeping your knee in line with your hip and foot. Do not allow your knee to bend so far that you cannot see your toes.  Slowly unlock your knee and lower yourself to the starting position. Your muscles, not gravity, should lower you. Repeat __________ times. Complete this exercise __________ times per day.   This information is not intended to replace advice given to you by your health care provider.  Make sure you discuss any questions you have with your health care provider.   Document Released: 12/08/2005 Document Revised: 08/29/2015 Document Reviewed: 03/22/2009 Else[employer] Interactive Patient Education [employer]

## 2015-10-02 NOTE — ED Notes (Signed)
C/o right knee pain onset 4 days; hx of arthritis  Denies inj/trauma; pain increases w/activity Brought back in wheel chair A&O x4... No acute distress.

## 2016-08-05 ENCOUNTER — Other Ambulatory Visit: Payer: Self-pay | Admitting: Adult Health

## 2016-08-05 DIAGNOSIS — I2510 Atherosclerotic heart disease of native coronary artery without angina pectoris: Secondary | ICD-10-CM

## 2016-08-05 DIAGNOSIS — I509 Heart failure, unspecified: Secondary | ICD-10-CM

## 2016-09-18 ENCOUNTER — Ambulatory Visit
Admission: RE | Admit: 2016-09-18 | Discharge: 2016-09-18 | Disposition: A | Discharge status: Home / Self Care | Payer: Medicare PPO | Source: Ambulatory Visit | Attending: Adult Health | Admitting: Adult Health

## 2016-09-18 ENCOUNTER — Other Ambulatory Visit: Payer: Self-pay | Admitting: Adult Health

## 2016-09-18 DIAGNOSIS — R2242 Localized swelling, mass and lump, left lower limb: Secondary | ICD-10-CM

## 2019-06-26 ENCOUNTER — Emergency Department (HOSPITAL_COMMUNITY): Payer: Medicare HMO

## 2019-06-26 ENCOUNTER — Observation Stay (HOSPITAL_COMMUNITY)
Admission: EM | Admit: 2019-06-26 | Discharge: 2019-06-27 | Disposition: A | Discharge status: Home / Self Care | Payer: Medicare HMO | Attending: Internal Medicine | Admitting: Internal Medicine

## 2019-06-26 ENCOUNTER — Emergency Department (HOSPITAL_BASED_OUTPATIENT_CLINIC_OR_DEPARTMENT_OTHER): Payer: Medicare HMO

## 2019-06-26 ENCOUNTER — Other Ambulatory Visit: Payer: Self-pay

## 2019-06-26 ENCOUNTER — Encounter (HOSPITAL_COMMUNITY): Payer: Self-pay | Admitting: Emergency Medicine

## 2019-06-26 DIAGNOSIS — I10 Essential (primary) hypertension: Secondary | ICD-10-CM

## 2019-06-26 DIAGNOSIS — E119 Type 2 diabetes mellitus without complications: Secondary | ICD-10-CM | POA: Insufficient documentation

## 2019-06-26 DIAGNOSIS — Z8249 Family history of ischemic heart disease and other diseases of the circulatory system: Secondary | ICD-10-CM | POA: Insufficient documentation

## 2019-06-26 DIAGNOSIS — R0789 Other chest pain: Secondary | ICD-10-CM | POA: Diagnosis not present

## 2019-06-26 DIAGNOSIS — Z6839 Body mass index (BMI) 39.0-39.9, adult: Secondary | ICD-10-CM | POA: Diagnosis not present

## 2019-06-26 DIAGNOSIS — E669 Obesity, unspecified: Secondary | ICD-10-CM | POA: Diagnosis not present

## 2019-06-26 DIAGNOSIS — Z7984 Long term (current) use of oral hypoglycemic drugs: Secondary | ICD-10-CM | POA: Insufficient documentation

## 2019-06-26 DIAGNOSIS — R609 Edema, unspecified: Secondary | ICD-10-CM

## 2019-06-26 DIAGNOSIS — Z79899 Other long term (current) drug therapy: Secondary | ICD-10-CM | POA: Diagnosis not present

## 2019-06-26 DIAGNOSIS — R079 Chest pain, unspecified: Secondary | ICD-10-CM

## 2019-06-26 DIAGNOSIS — Z7982 Long term (current) use of aspirin: Secondary | ICD-10-CM | POA: Insufficient documentation

## 2019-06-26 DIAGNOSIS — I5021 Acute systolic (congestive) heart failure: Secondary | ICD-10-CM | POA: Diagnosis present

## 2019-06-26 DIAGNOSIS — Z87891 Personal history of nicotine dependence: Secondary | ICD-10-CM | POA: Diagnosis not present

## 2019-06-26 DIAGNOSIS — E785 Hyperlipidemia, unspecified: Secondary | ICD-10-CM | POA: Diagnosis not present

## 2019-06-26 DIAGNOSIS — I340 Nonrheumatic mitral (valve) insufficiency: Secondary | ICD-10-CM | POA: Insufficient documentation

## 2019-06-26 DIAGNOSIS — I272 Pulmonary hypertension, unspecified: Secondary | ICD-10-CM | POA: Insufficient documentation

## 2019-06-26 DIAGNOSIS — I5023 Acute on chronic systolic (congestive) heart failure: Secondary | ICD-10-CM | POA: Diagnosis not present

## 2019-06-26 DIAGNOSIS — Z1159 Encounter for screening for other viral diseases: Secondary | ICD-10-CM | POA: Insufficient documentation

## 2019-06-26 DIAGNOSIS — I110 Hypertensive heart disease with heart failure: Principal | ICD-10-CM | POA: Insufficient documentation

## 2019-06-26 DIAGNOSIS — I429 Cardiomyopathy, unspecified: Secondary | ICD-10-CM | POA: Diagnosis not present

## 2019-06-26 LAB — TSH: TSH: 1.319 u[IU]/mL (ref 0.350–4.500)

## 2019-06-26 LAB — TROPONIN I (HIGH SENSITIVITY)
Troponin I (High Sensitivity): 18 ng/L — ABNORMAL HIGH (ref <18)
Troponin I (High Sensitivity): 23 ng/L — ABNORMAL HIGH (ref <18)

## 2019-06-26 LAB — BASIC METABOLIC PANEL
Anion gap: 10 (ref 5–15)
BUN: 21 mg/dL (ref 8–23)
CO2: 21 mmol/L — ABNORMAL LOW (ref 22–32)
Calcium: 8.8 mg/dL — ABNORMAL LOW (ref 8.9–10.3)
Chloride: 106 mmol/L (ref 98–111)
Creatinine, Ser: 1.17 mg/dL (ref 0.61–1.24)
GFR calc Af Amer: >60 mL/min (ref >60)
GFR calc non Af Amer: 59 mL/min — ABNORMAL LOW (ref >60)
Glucose, Bld: 131 mg/dL — ABNORMAL HIGH (ref 70–99)
Potassium: 4.4 mmol/L (ref 3.5–5.1)
Sodium: 137 mmol/L (ref 135–145)

## 2019-06-26 LAB — CBC WITH DIFFERENTIAL/PLATELET
Abs Immature Granulocytes: 0.01 10*3/uL (ref 0.00–0.07)
Basophils Absolute: 0 10*3/uL (ref 0.0–0.1)
Basophils Relative: 0 %
Eosinophils Absolute: 0.1 10*3/uL (ref 0.0–0.5)
Eosinophils Relative: 1 %
HCT: 35.9 % — ABNORMAL LOW (ref 39.0–52.0)
Hemoglobin: 11.7 g/dL — ABNORMAL LOW (ref 13.0–17.0)
Immature Granulocytes: 0 %
Lymphocytes Relative: 26 %
Lymphs Abs: 1.3 10*3/uL (ref 0.7–4.0)
MCH: 30.9 pg (ref 26.0–34.0)
MCHC: 32.6 g/dL (ref 30.0–36.0)
MCV: 94.7 fL (ref 80.0–100.0)
Monocytes Absolute: 0.3 10*3/uL (ref 0.1–1.0)
Monocytes Relative: 7 %
Neutro Abs: 3.3 10*3/uL (ref 1.7–7.7)
Neutrophils Relative %: 66 %
Platelets: 201 10*3/uL (ref 150–400)
RBC: 3.79 MIL/uL — ABNORMAL LOW (ref 4.22–5.81)
RDW: 14.5 % (ref 11.5–15.5)
WBC: 5 10*3/uL (ref 4.0–10.5)
nRBC: 0 % (ref 0.0–0.2)

## 2019-06-26 LAB — BRAIN NATRIURETIC PEPTIDE: B Natriuretic Peptide: 800.2 pg/mL — ABNORMAL HIGH (ref 0.0–100.0)

## 2019-06-26 LAB — SARS CORONAVIRUS 2 BY RT PCR (HOSPITAL ORDER, PERFORMED IN ~~LOC~~ HOSPITAL LAB): SARS Coronavirus 2: NEGATIVE

## 2019-06-26 LAB — MAGNESIUM: Magnesium: 2.1 mg/dL (ref 1.7–2.4)

## 2019-06-26 MED ORDER — ONDANSETRON HCL 4 MG/2ML IJ SOLN: 4.0000 mg | Freq: Four times a day (QID) | INTRAMUSCULAR | Status: DC | PRN

## 2019-06-26 MED ORDER — ZOLPIDEM TARTRATE 5 MG PO TABS: 5.0000 mg | ORAL_TABLET | Freq: Every evening | ORAL | Status: DC | PRN

## 2019-06-26 MED ORDER — SODIUM CHLORIDE 0.9 % IV SOLN: 250.0000 mL | INTRAVENOUS | Status: DC | PRN

## 2019-06-26 MED ORDER — LOSARTAN POTASSIUM 25 MG PO TABS
25.0000 mg | ORAL_TABLET | Freq: Every day | ORAL | Status: DC
  Administered 2019-06-26 – 2019-06-27 (×2): 25 mg via ORAL
  Filled 2019-06-26 (×2): qty 1

## 2019-06-26 MED ORDER — SODIUM CHLORIDE 0.9% FLUSH
3.0000 mL | Freq: Two times a day (BID) | INTRAVENOUS | Status: DC
  Administered 2019-06-26 – 2019-06-27 (×2): 3 mL via INTRAVENOUS

## 2019-06-26 MED ORDER — FUROSEMIDE 10 MG/ML IJ SOLN
80.0000 mg | Freq: Two times a day (BID) | INTRAMUSCULAR | Status: DC
  Administered 2019-06-26 – 2019-06-27 (×2): 80 mg via INTRAVENOUS
  Filled 2019-06-26 (×3): qty 8

## 2019-06-26 MED ORDER — HEPARIN SODIUM (PORCINE) 5000 UNIT/ML IJ SOLN
5000.0000 [IU] | Freq: Three times a day (TID) | INTRAMUSCULAR | Status: DC
  Administered 2019-06-26 – 2019-06-27 (×2): 5000 [IU] via SUBCUTANEOUS
  Filled 2019-06-26 (×3): qty 1

## 2019-06-26 MED ORDER — POTASSIUM CHLORIDE CRYS ER 20 MEQ PO TBCR
20.0000 meq | EXTENDED_RELEASE_TABLET | Freq: Two times a day (BID) | ORAL | Status: DC
  Administered 2019-06-26 – 2019-06-27 (×2): 20 meq via ORAL
  Filled 2019-06-26 (×2): qty 1

## 2019-06-26 MED ORDER — ASPIRIN EC 81 MG PO TBEC
81.0000 mg | DELAYED_RELEASE_TABLET | Freq: Every day | ORAL | Status: DC
  Administered 2019-06-26 – 2019-06-27 (×2): 81 mg via ORAL
  Filled 2019-06-26 (×2): qty 1

## 2019-06-26 MED ORDER — ALPRAZOLAM 0.25 MG PO TABS: 0.2500 mg | ORAL_TABLET | Freq: Two times a day (BID) | ORAL | Status: DC | PRN

## 2019-06-26 MED ORDER — ACETAMINOPHEN 325 MG PO TABS: 650.0000 mg | ORAL_TABLET | ORAL | Status: DC | PRN

## 2019-06-26 MED ORDER — SODIUM CHLORIDE 0.9% FLUSH: 3.0000 mL | INTRAVENOUS | Status: DC | PRN

## 2019-06-26 NOTE — ED Triage Notes (Signed)
Patient arrived from home via [facility] with reports of chest tightness X 2 weeks, with shortness of breath last night, and bilateral lower extremities edema. Reports 1 wk of cough at night. History of CHF

## 2019-06-26 NOTE — ED Provider Notes (Signed)
[facility] Provider Note   CSN: [id number] Arrival date & time: 06/26/19  1320    History   Chief Complaint Chief Complaint  Patient presents with  . Chest Pain    HPI Trevor Rodriguez is a 78 y.o. male with history of hypertension, hyperlipidemia, CHF, diabetes who presents with a 2-week history of chest tightness and shortness of breath that began last night.  He is also had a one-week history of productive cough and is also noticed left greater than right lower extremity edema.  He denies any recent long trips or surgeries.  He has no history of blood clots.  Patient reports he has been having to use more than one pillow and gets anxious when lying flat.  He denies any fevers, abdominal pain, nausea, vomiting.  He takes Lasix 20 mg daily     HPI  Past Medical History:  Diagnosis Date  . Congestive heart failure (HCC)    Ejection fraction around 15%. He was recently horpitalized for heart failure and had a heart catheterization. He had minor luminal irregularities.  . Hyperlipidemia   . Hypertension     Patient Active Problem List   Diagnosis Date Noted  . Acute systolic HF (heart failure) (HCC) 06/26/2019  . 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 Type II or unspecified type diabetes mellitus without mention of complication, not stated as uncontrolled 02/22/2013  . CHF (congestive heart failure) (HCC) 02/20/2013  . Hyperglycemia 02/20/2013  . Essential hypertension, benign 02/20/2013  . Other and unspecified hyperlipidemia 02/20/2013    Past Surgical History:  Procedure Laterality Date  . CARDIAC CATHETERIZATION  2011  . INCISION AND DRAINAGE ABSCESS N/A 02/20/2013   Procedure: Debridemnt of lower abdominal wall, suprapubic region, scrotum and right thigh;  Surgeon: Wilmon ArmsMatthew K. Corliss Skainssuei, MD;  Location: [facility] OR;  Service: General;  Laterality: N/A;        Home Medications    Prior to  Admission medications   Medication Sig Start Date End Date Taking? Authorizing Provider  aspirin 81 MG EC tablet Take 81 mg by mouth daily.    Yes [provider]  carvedilol (COREG) 6.25 MG tablet TAKE ONE TABLET BY MOUTH TWICE DAILY WITH MEALS Patient taking differently: Take 6.25 mg by mouth daily at 12 noon.  10/04/11  Yes Nahser, Deloris PingPhilip J, MD  colchicine 0.6 MG tablet Take 0.6 mg by mouth as needed. Gout flare up 04/10/19  Yes [provider]  furosemide (LASIX) 20 MG tablet TAKE ONE TABLET BY MOUTH EVERY DAY Patient taking differently: Take 20 mg by mouth daily at 12 noon.  10/04/11  Yes Nahser, Deloris PingPhilip J, MD  lisinopril (PRINIVIL,ZESTRIL) 2.5 MG tablet Take 1 tablet (2.5 mg total) by mouth daily. 02/28/13  Yes Cristal Fordeddy, Srikar A, MD  metFORMIN (GLUCOPHAGE) 500 MG tablet Take 1 tablet (500 mg total) by mouth 2 (two) times daily with a meal. Patient taking differently: Take 500 mg by mouth daily at 12 noon.  02/28/13  Yes Reddy, Srikar A, MD  simvastatin (ZOCOR) 40 MG tablet TAKE ONE TABLET BY MOUTH EVERY DAY Patient taking differently: Take 40 mg by mouth daily at 12 noon.  11/05/11  Yes Nahser, Deloris PingPhilip J, MD  spironolactone (ALDACTONE) 25 MG tablet Take 25 mg by mouth daily at 12 noon.  01/24/13  Yes [provider]  Tamsulosin HCl (FLOMAX) 0.4 MG CAPS TAKE ONE CAPSULE BY MOUTH EVERY DAY Patient taking differently:  Take 0.4 mg by mouth daily at 12 noon.  10/04/11  Yes Nahser, Deloris Ping, MD  Blood Glucose Monitoring Suppl (RELION PRIME MONITOR) DEVI  03/28/13   [provider]  oxyCODONE-acetaminophen (PERCOCET/ROXICET) 5-325 MG per tablet Take 1-2 tablets by mouth every 4 (four) hours as needed. Patient not taking: Reported on 06/26/2019 03/02/13   Andrena Mews, DO  RELION PRIME TEST test strip  03/28/13   [provider]  RELION ULTRA THIN PLUS LANCETS MISC  03/28/13   [provider]  traMADol (ULTRAM) 50 MG tablet Take 1 tablet (50 mg total) by  mouth every 6 (six) hours as needed. Patient not taking: Reported on 06/26/2019 10/02/15   Hayden Rasmussen, NP    Family History Family History  Problem Relation Age of Onset  . Cancer Mother   . Heart disease Father     Social History Social History   Tobacco Use  . Smoking status: Former Smoker    Packs/day: 0.50    Types: Cigarettes    Quit date: 03/07/1993    Years since quitting: 26.3  Substance Use Topics  . Alcohol use: No  . Drug use: No     Allergies   Ibuprofen   Review of Systems Review of Systems  Constitutional: Negative for chills and fever.  HENT: Negative for facial swelling and sore throat.   Respiratory: Positive for cough, chest tightness and shortness of breath.   Cardiovascular: Positive for chest pain and leg swelling.  Gastrointestinal: Negative for abdominal pain, nausea and vomiting.  Genitourinary: Negative for dysuria.  Musculoskeletal: Negative for back pain.  Skin: Negative for rash and wound.  Neurological: Negative for headaches.  Psychiatric/Behavioral: The patient is not nervous/anxious.      Physical Exam Updated Vital Signs BP 102/64   Pulse 73   Temp 97.9 F (36.6 C) (Oral)   Resp (!) 21   Ht 5\' 6"  (1.676 m)   Wt 109.8 kg   SpO2 97%   BMI 39.06 kg/m   Physical Exam Vitals signs and nursing note reviewed.  Constitutional:      General: He is not in acute distress.    Appearance: He is well-developed. He is not diaphoretic.  HENT:     Head: Normocephalic and atraumatic.     Mouth/Throat:     Pharynx: No oropharyngeal exudate.  Eyes:     General: No scleral icterus.       Right eye: No discharge.        Left eye: No discharge.     Conjunctiva/sclera: Conjunctivae normal.     Pupils: Pupils are equal, round, and reactive to light.  Neck:     Musculoskeletal: Normal range of motion and neck supple.     Thyroid: No thyromegaly.  Cardiovascular:     Rate and Rhythm: Normal rate and regular rhythm.     Heart sounds:  Normal heart sounds. No murmur. No friction rub. No gallop.   Pulmonary:     Effort: Pulmonary effort is normal. No respiratory distress.     Breath sounds: No stridor. Rales (bilateral bases) present. No wheezing.  Abdominal:     General: Bowel sounds are normal. There is no distension.     Palpations: Abdomen is soft.     Tenderness: There is no abdominal tenderness. There is no guarding or rebound.  Musculoskeletal:     Right lower leg: He exhibits no tenderness. Edema present.     Left lower leg: He exhibits no tenderness.  No edema.  Lymphadenopathy:     Cervical: No cervical adenopathy.  Skin:    General: Skin is warm and dry.     Coloration: Skin is not pale.     Findings: No rash.  Neurological:     Mental Status: He is alert.     Coordination: Coordination normal.      ED Treatments / Results  Labs (all labs ordered are listed, but only abnormal results are displayed) Labs Reviewed  BASIC METABOLIC PANEL - Abnormal; Notable for the following components:      Result Value   CO2 21 (*)    Glucose, Bld 131 (*)    Calcium 8.8 (*)    GFR calc non Af Amer 59 (*)    All other components within normal limits  CBC WITH DIFFERENTIAL/PLATELET - Abnormal; Notable for the following components:   RBC 3.79 (*)    Hemoglobin 11.7 (*)    HCT 35.9 (*)    All other components within normal limits  TROPONIN I (HIGH SENSITIVITY) - Abnormal; Notable for the following components:   Troponin I (High Sensitivity) 18 (*)    All other components within normal limits  TROPONIN I (HIGH SENSITIVITY) - Abnormal; Notable for the following components:   Troponin I (High Sensitivity) 23 (*)    All other components within normal limits  BRAIN NATRIURETIC PEPTIDE - Abnormal; Notable for the following components:   B Natriuretic Peptide 800.2 (*)    All other components within normal limits  SARS CORONAVIRUS 2 (HOSPITAL ORDER, PERFORMED IN [facility])  CBC  BASIC METABOLIC PANEL   MAGNESIUM  TSH  BASIC METABOLIC PANEL    EKG EKG Interpretation  Date/Time:  Sunday June 26 2019 13:21:45 EDT Ventricular Rate:  97 PR Interval:    QRS Duration: 107 QT Interval:  374 QTC Calculation: 476 R Axis:   37 Text Interpretation:  Sinus rhythm Atrial premature complex Consider right atrial enlargement Repol abnrm suggests ischemia, anterolateral When comapred to prior, t wave inversions in V4-V6 new from prior.  No STEMI Confirmed by Theda Belfastegeler, Chris ([id number]) on 06/26/2019 2:48:42 PM   Radiology Dg Chest 2 View  Result Date: 06/26/2019 CLINICAL DATA:  Shortness of breath. EXAM: CHEST - 2 VIEW COMPARISON:  Radiograph of February 20, 2013. FINDINGS: Stable cardiomegaly. No pneumothorax or pleural effusion is noted. Left lung is clear. Minimal right basilar edema may be present. Bony thorax is unremarkable. IMPRESSION: Minimal right basilar edema may be present. Electronically Signed   By: Lupita RaiderJames  Green Jr M.D.   On: 06/26/2019 16:01   Vas [place] Lower Extremity Venous (dvt) (only [facility] 7a-7p)  Result Date: 06/26/2019  Lower Venous Study Indications: Edema.  Risk Factors: History of reduced EF and CHF. Limitations: Edema, low flow to lower extremities. Comparison Study: Prior negative LLEV study from 09/18/16 Performing Technologist: Sherren Kernsandace Kanady RVS  Examination Guidelines: A complete evaluation includes B-mode imaging, spectral Doppler, color Doppler, and power Doppler as needed of all accessible portions of each vessel. Bilateral testing is considered an integral part of a complete examination. Limited examinations for reoccurring indications may be performed as noted.  +-----+---------------+---------+-----------+----------+---------+ RIGHTCompressibilityPhasicitySpontaneityPropertiesSummary   +-----+---------------+---------+-----------+----------+---------+ CFV  Full                                         pulsatile  +-----+---------------+---------+-----------+----------+---------+   +---------+---------------+---------+-----------+----------+---------+ LEFT  CompressibilityPhasicitySpontaneityPropertiesSummary   +---------+---------------+---------+-----------+----------+---------+ CFV      Full                                         pulsatile +---------+---------------+---------+-----------+----------+---------+ SFJ      Full                                                   +---------+---------------+---------+-----------+----------+---------+ FV Prox  Full                                                   +---------+---------------+---------+-----------+----------+---------+ FV Mid   Full                                                   +---------+---------------+---------+-----------+----------+---------+ FV DistalFull                                                   +---------+---------------+---------+-----------+----------+---------+ PFV      Full                                                   +---------+---------------+---------+-----------+----------+---------+ POP      Full                                         pulsatile +---------+---------------+---------+-----------+----------+---------+ PTV      Full                                                   +---------+---------------+---------+-----------+----------+---------+ PERO     Full                                                   +---------+---------------+---------+-----------+----------+---------+     Summary: Right: No evidence of common femoral vein obstruction. Left: Findings appear essentially unchanged compared to previous examination. There is no evidence of deep vein thrombosis in the lower extremity.  *See table(s) above for measurements and observations.    Preliminary     Procedures Procedures (including critical care time)  Medications Ordered in ED Medications   sodium chloride flush (NS) 0.9 % injection 3 mL (has no administration in time range)  sodium chloride flush (NS) 0.9 % injection 3 mL (has no administration in time range)  0.9 %  sodium chloride infusion (has  no administration in time range)  acetaminophen (TYLENOL) tablet 650 mg (has no administration in time range)  ondansetron (ZOFRAN) injection 4 mg (has no administration in time range)  heparin injection 5,000 Units (has no administration in time range)  furosemide (LASIX) injection 80 mg (80 mg Intravenous Given 06/26/19 1659)  potassium chloride SA (K-DUR) CR tablet 20 mEq (has no administration in time range)  losartan (COZAAR) tablet 25 mg (has no administration in time range)  aspirin EC tablet 81 mg (has no administration in time range)  zolpidem (AMBIEN) tablet 5 mg (has no administration in time range)  ALPRAZolam (XANAX) tablet 0.25 mg (has no administration in time range)     Initial Impression / Assessment and Plan / ED Course  I have reviewed the triage vital signs and the nursing notes.  Pertinent labs & imaging results that were available during my care of the patient were reviewed by me and considered in my medical decision making (see chart for details).        Patient presenting with suspected CHF exacerbation.  She is found to have BNP of 800.2.  Initial troponin 18.  CBC shows hemoglobin 11.7, BMP within normal limits.  COVID-19 negative.  DVT ultrasound of the left leg is negative.  Chest x-ray shows minimal right basilar edema present.  I discussed patient case with cardiologist on-call, Dr. Meda Coffee, who will evaluate and admit the patient for further evaluation and treatment.  Dr. Haroldine Laws with cardiology evaluated the patient and patient will be admitted to their service.  I appreciate the above consultants for their assistance with the patient.  Patient also evaluated by my attending, Dr. Sherry Ruffing, who guided the patient's management and agrees with plan.  Final  Clinical Impressions(s) / ED Diagnoses   Final diagnoses:  Chest pain, unspecified type  Acute on chronic systolic congestive heart failure [facility])    ED Discharge Orders    None       Frederica Kuster, PA-C 06/26/19 1743    Tegeler, Gwenyth Allegra, MD 06/28/19 [phone]

## 2019-06-26 NOTE — ED Notes (Signed)
Daughter and family called to give update at bedside. On speaker phone, PT was able to speak to family as well. Plan of care discussed

## 2019-06-26 NOTE — ED Notes (Signed)
ED TO INPATIENT HANDOFF REPORT  ED Nurse Name and Phone #: [phone]  S Name/Age/Gender Trevor Rodriguez 78 y.o. male Room/Bed: 028C/028C  Code Status   Code Status: Full Code  Home/SNF/Other Home Patient oriented to: self, place, time and situation Is this baseline? Yes   Triage Complete: Triage complete  Chief Complaint chest pain  Triage Note Patient arrived from home via [facility] with reports of chest tightness X 2 weeks, with shortness of breath last night, and bilateral lower extremities edema. Reports 1 wk of cough at night. History of CHF   Allergies Allergies  Allergen Reactions  . Ibuprofen Nausea Only    Level of Care/Admitting Diagnosis ED Disposition    ED Disposition Condition Comment   Admit  Hospital Area: [facility] [[id number]]  Level of Care: Telemetry Cardiac [103]  Covid Evaluation: Confirmed COVID Negative  Diagnosis: Acute systolic HF (heart failure) (HCC) [201007]  Admitting Physician: Dolores Patty [[id number]]  Attending Physician: Dolores Patty [[id number]]  Estimated length of stay: past midnight tomorrow  Certification:: I certify this patient will need inpatient services for at least 2 midnights  Bed request comments: 3E  PT Class (Do Not Modify): Inpatient [101]  PT Acc Code (Do Not Modify): Private [1]       B Medical/Surgery History Past Medical History:  Diagnosis Date  . Congestive heart failure (HCC)    Ejection fraction around 15%. He was recently horpitalized for heart failure and had a heart catheterization. He had minor luminal irregularities.  . Hyperlipidemia   . Hypertension    Past Surgical History:  Procedure Laterality Date  . CARDIAC CATHETERIZATION  2011  . INCISION AND DRAINAGE ABSCESS N/A 02/20/2013   Procedure: Debridemnt of lower abdominal wall, suprapubic region, scrotum and right thigh;  Surgeon: Wilmon Arms. Corliss Skains, MD;  Location: [facility] OR;  Service: General;  Laterality: N/A;     A IV  Location/Drains/Wounds Patient Lines/Drains/Airways Status   Active Line/Drains/Airways    Name:   Placement date:   Placement time:   Site:   Days:   Peripheral IV 02/24/13 Right;Anterior Forearm   02/24/13    2238    Forearm   2313   Peripheral IV 06/26/19 Left Hand   06/26/19    1239    Hand   less than 1   Incision 02/20/13 Perineum Right   02/20/13    0922     2317          Intake/Output Last 24 hours No intake or output data in the 24 hours ending 06/26/19 1635  Labs/Imaging Results for orders placed or performed during the hospital encounter of 06/26/19 (from the past 48 hour(s))  Basic metabolic panel     Status: Abnormal   Collection Time: 06/26/19  1:25 PM  Result Value Ref Range   Sodium 137 135 - 145 mmol/L   Potassium 4.4 3.5 - 5.1 mmol/L    Comment: SLIGHT HEMOLYSIS   Chloride 106 98 - 111 mmol/L   CO2 21 (L) 22 - 32 mmol/L   Glucose, Bld 131 (H) 70 - 99 mg/dL   BUN 21 8 - 23 mg/dL   Creatinine, Ser 1.21 0.61 - 1.24 mg/dL   Calcium 8.8 (L) 8.9 - 10.3 mg/dL   GFR calc non Af Amer 59 (L) >60 mL/min   GFR calc Af Amer >60 >60 mL/min   Anion gap 10 5 - 15    Comment: Performed at [facility], [address] [place], [place] [address]  CBC with Differential     Status: Abnormal   Collection Time: 06/26/19  1:25 PM  Result Value Ref Range   WBC 5.0 4.0 - 10.5 K/uL   RBC 3.79 (L) 4.22 - 5.81 MIL/uL   Hemoglobin 11.7 (L) 13.0 - 17.0 g/dL   HCT 60.435.9 (L) 54.039.0 - 98.152.0 %   MCV 94.7 80.0 - 100.0 fL   MCH 30.9 26.0 - 34.0 pg   MCHC 32.6 30.0 - 36.0 g/dL   RDW 19.114.5 47.811.5 - 29.515.5 %   Platelets 201 150 - 400 K/uL   nRBC 0.0 0.0 - 0.2 %   Neutrophils Relative % 66 %   Neutro Abs 3.3 1.7 - 7.7 K/uL   Lymphocytes Relative 26 %   Lymphs Abs 1.3 0.7 - 4.0 K/uL   Monocytes Relative 7 %   Monocytes Absolute 0.3 0.1 - 1.0 K/uL   Eosinophils Relative 1 %   Eosinophils Absolute 0.1 0.0 - 0.5 K/uL   Basophils Relative 0 %   Basophils Absolute 0.0 0.0 - 0.1 K/uL    Immature Granulocytes 0 %   Abs Immature Granulocytes 0.01 0.00 - 0.07 K/uL    Comment: Performed at [facility], [address] [place], [place] [address]  Troponin I (High Sensitivity)     Status: Abnormal   Collection Time: 06/26/19  1:25 PM  Result Value Ref Range   Troponin I (High Sensitivity) 18 (H) <18 ng/L    Comment: (NOTE) Elevated high sensitivity troponin I (hsTnI) values and significant  changes across serial measurements may suggest ACS but many other  chronic and acute conditions are known to elevate hsTnI results.  Refer to the Links section for chest pain algorithms and additional  guidance. Performed at [facility], [address] [place], [place] [address]   Brain natriuretic peptide     Status: Abnormal   Collection Time: 06/26/19  1:25 PM  Result Value Ref Range   B Natriuretic Peptide 800.2 (H) 0.0 - 100.0 pg/mL    Comment: Performed at [facility], [address] [place], [place] [address]  SARS Coronavirus 2 (CEPHEID - Performed in [facility] hospital lab), Hosp Order     Status: None   Collection Time: 06/26/19  1:38 PM   Specimen: Nasopharyngeal Swab  Result Value Ref Range   SARS Coronavirus 2 NEGATIVE NEGATIVE    Comment: (NOTE) If result is NEGATIVE SARS-CoV-2 target nucleic acids are NOT DETECTED. The SARS-CoV-2 RNA is generally detectable in upper and lower  respiratory specimens during the acute phase of infection. The lowest  concentration of SARS-CoV-2 viral copies this assay can detect is 250  copies / mL. A negative result does not preclude SARS-CoV-2 infection  and should not be used as the sole basis for treatment or other  patient management decisions.  A negative result may occur with  improper specimen collection / handling, submission of specimen other  than nasopharyngeal swab, presence of viral mutation(s) within the  areas targeted by this assay, and inadequate number of viral copies  (<250 copies / mL). A  negative result must be combined with clinical  observations, patient history, and epidemiological information. If result is POSITIVE SARS-CoV-2 target nucleic acids are DETECTED. The SARS-CoV-2 RNA is generally detectable in upper and lower  respiratory specimens dur ing the acute phase of infection.  Positive  results are indicative of active infection with SARS-CoV-2.  Clinical  correlation with patient  history and other diagnostic information is  necessary to determine patient infection status.  Positive results do  not rule out bacterial infection or co-infection with other viruses. If result is PRESUMPTIVE POSTIVE SARS-CoV-2 nucleic acids MAY BE PRESENT.   A presumptive positive result was obtained on the submitted specimen  and confirmed on repeat testing.  While 2019 novel coronavirus  (SARS-CoV-2) nucleic acids may be present in the submitted sample  additional confirmatory testing may be necessary for epidemiological  and / or clinical management purposes  to differentiate between  SARS-CoV-2 and other Sarbecovirus currently known to infect humans.  If clinically indicated additional testing with an alternate test  methodology [phone] is advised. The SARS-CoV-2 RNA is generally  detectable in upper and lower respiratory sp ecimens during the acute  phase of infection. The expected result is Negative. Fact Sheet for Patients:  BoilerBrush.com.cyhttps://www.fda.gov/media/136312/download Fact Sheet for Healthcare Providers: https://pope.com/https://www.fda.gov/media/136313/download This test is not yet approved or cleared by the [place] FDA and has been authorized for detection and/or diagnosis of SARS-CoV-2 by FDA under an Emergency Use Authorization (EUA).  This EUA will remain in effect (meaning this test can be used) for the duration of the COVID-19 declaration under Section 564(b)(1) of the Act, 21 U.S.C. section 360bbb-3(b)(1), unless the authorization is terminated or revoked sooner. Performed  at [facility], [address] [place], [place] [address]   Troponin I (High Sensitivity)     Status: Abnormal   Collection Time: 06/26/19  3:39 PM  Result Value Ref Range   Troponin I (High Sensitivity) 23 (H) <18 ng/L    Comment: (NOTE) Elevated high sensitivity troponin I (hsTnI) values and significant  changes across serial measurements may suggest ACS but many other  chronic and acute conditions are known to elevate hsTnI results.  Refer to the "Links" section for chest pain algorithms and additional  guidance. Performed at [facility], [address] [place], [place] [address]    Dg Chest 2 View  Result Date: 06/26/2019 CLINICAL DATA:  Shortness of breath. EXAM: CHEST - 2 VIEW COMPARISON:  Radiograph of February 20, 2013. FINDINGS: Stable cardiomegaly. No pneumothorax or pleural effusion is noted. Left lung is clear. Minimal right basilar edema may be present. Bony thorax is unremarkable. IMPRESSION: Minimal right basilar edema may be present. Electronically Signed   By: Lupita RaiderJames  Green Jr M.D.   On: 06/26/2019 16:01   Vas Koreas Lower Extremity Venous (dvt) (only Mc & Wl 7a-7p)  Result Date: 06/26/2019  Lower Venous Study Indications: Edema.  Risk Factors: History of reduced EF and CHF. Limitations: Edema, low flow to lower extremities. Comparison Study: Prior negative LLEV study from 09/18/16 Performing Technologist: Sherren Kernsandace Kanady RVS  Examination Guidelines: A complete evaluation includes B-mode imaging, spectral Doppler, color Doppler, and power Doppler as needed of all accessible portions of each vessel. Bilateral testing is considered an integral part of a complete examination. Limited examinations for reoccurring indications may be performed as noted.  +-----+---------------+---------+-----------+----------+---------+ RIGHTCompressibilityPhasicitySpontaneityPropertiesSummary   +-----+---------------+---------+-----------+----------+---------+ CFV  Full                                          pulsatile +-----+---------------+---------+-----------+----------+---------+   +---------+---------------+---------+-----------+----------+---------+ LEFT     CompressibilityPhasicitySpontaneityPropertiesSummary   +---------+---------------+---------+-----------+----------+---------+ CFV      Full  pulsatile +---------+---------------+---------+-----------+----------+---------+ SFJ      Full                                                   +---------+---------------+---------+-----------+----------+---------+ FV Prox  Full                                                   +---------+---------------+---------+-----------+----------+---------+ FV Mid   Full                                                   +---------+---------------+---------+-----------+----------+---------+ FV DistalFull                                                   +---------+---------------+---------+-----------+----------+---------+ PFV      Full                                                   +---------+---------------+---------+-----------+----------+---------+ POP      Full                                         pulsatile +---------+---------------+---------+-----------+----------+---------+ PTV      Full                                                   +---------+---------------+---------+-----------+----------+---------+ PERO     Full                                                   +---------+---------------+---------+-----------+----------+---------+     Summary: Right: No evidence of common femoral vein obstruction. Left: Findings appear essentially unchanged compared to previous examination. There is no evidence of deep vein thrombosis in the lower extremity.  *See table(s) above for measurements and observations.    Preliminary     Pending Labs Unresulted Labs (From admission, onward)     Start     Ordered   06/27/19 0500  Basic metabolic panel  Daily,   R     06/26/19 1622   06/26/19 1617  Basic metabolic panel  Once,   STAT     06/26/19 1622   06/26/19 1617  Magnesium  Once,   STAT     06/26/19 1622   06/26/19 1617  TSH  Once,   STAT     06/26/19 1622   06/26/19 1614  CBC  (heparin)  Once,   STAT  Comments: Baseline for heparin therapy IF NOT ALREADY DRAWN.  Notify MD if PLT < 100 K.    06/26/19 1622          Vitals/Pain Today's Vitals   06/26/19 1515 06/26/19 1530 06/26/19 1623 06/26/19 1630  BP: 126/83  114/70 103/72  Pulse:  92 87 87  Resp: (!) 22 20 19 17   Temp:      TempSrc:      SpO2:  100% 98% 96%  Weight:      Height:      PainSc:        Isolation Precautions No active isolations  Medications Medications  sodium chloride flush (NS) 0.9 % injection 3 mL (has no administration in time range)  sodium chloride flush (NS) 0.9 % injection 3 mL (has no administration in time range)  0.9 %  sodium chloride infusion (has no administration in time range)  acetaminophen (TYLENOL) tablet 650 mg (has no administration in time range)  ondansetron (ZOFRAN) injection 4 mg (has no administration in time range)  heparin injection 5,000 Units (has no administration in time range)  furosemide (LASIX) injection 80 mg (has no administration in time range)  potassium chloride SA (K-DUR) CR tablet 20 mEq (has no administration in time range)  losartan (COZAAR) tablet 25 mg (has no administration in time range)  aspirin EC tablet 81 mg (has no administration in time range)  zolpidem (AMBIEN) tablet 5 mg (has no administration in time range)  ALPRAZolam (XANAX) tablet 0.25 mg (has no administration in time range)    Mobility walks Low fall risk   Focused Assessments Cardiac Assessment Handoff:    Lab Results  Component Value Date   CKTOTAL 137 07/12/2010   CKMB 2.2 07/12/2010   TROPONINI <0.30 02/20/2013   No results found for: DDIMER Does the  Patient currently have chest pain? Yes       R Recommendations: See Admitting Provider Note  Report given to:   Additional Notes: .

## 2019-06-26 NOTE — ED Notes (Signed)
Family notified of bed assignment and transfer

## 2019-06-26 NOTE — H&P (Signed)
Cardiology Admission History and Physical:   Patient ID: Trevor Rodriguez MRN: [id number]; DOB: [birth year]   Admission date: 06/26/2019  Primary Care Provider: Alvester Chou, DNP Primary Cardiologist: No primary care provider on file. remote Dr. Acie Fredrickson 2011 Primary Electrophysiologist:  None   Chief Complaint:  Chest tightness for 2 weeks and SOB edema.   Patient Profile:   Trevor Rodriguez is a 78 y.o. male with hx of CHF, CM with EF 10% on Echo 2011 and on cath 2011 minimal irregularities, HTN now presents with chest tightness for 2 weeks.    History of Present Illness:   Trevor Rodriguez with above hx now present with chest tightness for 2 weeks.  + SOB, and lower ext edema.    Prior hx with Echo in 2011 with EF 10% and diffuse hypokinesis, mild MR, LA moderately dilated. RV systolic function severely reduced. PA pk pressure 49 mmHg.  Cardiac cath with minimal irregularities.  Echo 2014 with EF 25-30%  LV moderately dilated, trivial AR, mild to mod MR, RA mildly dilated, PA pk pressure 39 mmHg   Saw Dr. Acie Fredrickson in 2014 and has not followed up with cardiology since. Says he has been following with his PCP, Alvester Chou DNP. Reports compliance with his medications but doesn't know what they are. In reviewing meds seems he has been on lasix 20 mg daily. Usually "gets around pretty good" but last week or so has had progressive DOE. Last night has orthopnea and PND. Went to church today and his grandson noticed he was much more SOB so came to ER. Has some chest tightness when he is SOB but denies chest pain or pressure. + edema.    EKG:  The ECG that was done SR at 97, and new deep T wave inversion in lat leads, LVH  was personally reviewed.   Troponin 18 BNP 800 Na 137, K+ 4.4 hgb 11.7, WBC 5 Plts 201 COVID neg  CXR: minimal right basilar edema Personally reviewed  Venous dopplers pending   Heart Pathway Score:     Past Medical History:  Diagnosis Date  . Congestive heart failure (HCC)    Ejection fraction around 15%. He was recently horpitalized for heart failure and had a heart catheterization. He had minor luminal irregularities.  . Hyperlipidemia   . Hypertension     Past Surgical History:  Procedure Laterality Date  . CARDIAC CATHETERIZATION  2011  . INCISION AND DRAINAGE ABSCESS N/A 02/20/2013   Procedure: Debridemnt of lower abdominal wall, suprapubic region, scrotum and right thigh;  Surgeon: Imogene Burn. Georgette Dover, MD;  Location: [facility];  Service: General;  Laterality: N/A;     Medications Prior to Admission:  To be reviewed  Prior to Admission medications   Medication Sig Start Date End Date Taking? Authorizing Provider  amoxicillin-clavulanate (AUGMENTIN) 875-125 MG per tablet Take 1 tablet by mouth 2 (two) times daily. 03/02/13   Gerda Diss, DO  aspirin 325 MG tablet Take 325 mg by mouth daily.    [provider]  Blood Glucose Monitoring Suppl ([facility]) [place]  03/28/13   [provider]  carvedilol (COREG) 6.25 MG tablet TAKE ONE TABLET BY MOUTH TWICE DAILY WITH MEALS 10/04/11   Nahser, Wonda Cheng, MD  furosemide (LASIX) 20 MG tablet TAKE ONE TABLET BY MOUTH EVERY DAY 10/04/11   Nahser, Wonda Cheng, MD  lisinopril (PRINIVIL,ZESTRIL) 2.5 MG tablet Take 1 tablet (2.5 mg total) by mouth daily. 02/28/13   Bynum Bellows, MD  metFORMIN (  GLUCOPHAGE) 500 MG tablet Take 1 tablet (500 mg total) by mouth 2 (two) times daily with a meal. 02/28/13   Cristal Fordeddy, Srikar A, MD  oxyCODONE-acetaminophen (PERCOCET/ROXICET) 5-325 MG per tablet Take 1-2 tablets by mouth every 4 (four) hours as needed. 03/02/13   Andrena Mewsigby, Michael D, DO  predniSONE (DELTASONE) 20 MG tablet Take 3 tabs po on first day, 2 tabs second day, 2 tabs third day, 1 tab fourth day, 1 tab 5th day. Take with food. 10/02/15   Hayden RasmussenMabe, David, NP  RELION PRIME TEST test strip  03/28/13   [provider]  Melynda RippleELION ULTRA THIN PLUS LANCETS MISC  03/28/13   [provider]  simvastatin (ZOCOR) 40 MG  tablet TAKE ONE TABLET BY MOUTH EVERY DAY 11/05/11   Nahser, Deloris PingPhilip J, MD  spironolactone (ALDACTONE) 25 MG tablet  01/24/13   [provider]  sulfamethoxazole-trimethoprim (BACTRIM DS) 800-160 MG per tablet  02/16/13   [provider]  Tamsulosin HCl (FLOMAX) 0.4 MG CAPS TAKE ONE CAPSULE BY MOUTH EVERY DAY 10/04/11   Nahser, Deloris PingPhilip J, MD  traMADol (ULTRAM) 50 MG tablet Take 1 tablet (50 mg total) by mouth every 6 (six) hours as needed. 10/02/15   Hayden RasmussenMabe, David, NP     Allergies:    Allergies  Allergen Reactions  . Ibuprofen Nausea Only    Social History:   Social History   Socioeconomic History  . Marital status: Widowed    Spouse name: Not on file  . Number of children: Not on file  . Years of education: Not on file  . Highest education level: Not on file  Occupational History  . Not on file  Social Needs  . Financial resource strain: Not on file  . Food insecurity    Worry: Not on file    Inability: Not on file  . Transportation needs    Medical: Not on file    Non-medical: Not on file  Tobacco Use  . Smoking status: Former Smoker    Packs/day: 0.50    Types: Cigarettes    Quit date: 03/07/1993    Years since quitting: 26.3  Substance and Sexual Activity  . Alcohol use: No  . Drug use: No  . Sexual activity: Not on file  Lifestyle  . Physical activity    Days per week: Not on file    Minutes per session: Not on file  . Stress: Not on file  Relationships  . Social Musicianconnections    Talks on phone: Not on file    Gets together: Not on file    Attends religious service: Not on file    Active member of club or organization: Not on file    Attends meetings of clubs or organizations: Not on file    Relationship status: Not on file  . Intimate partner violence    Fear of current or ex partner: Not on file    Emotionally abused: Not on file    Physically abused: Not on file    Forced sexual activity: Not on file  Other Topics Concern  . Not on file   Social History Narrative  . Not on file    Family History:   The patient's family history includes Cancer in his mother; Heart disease in his father.    Review of Systems: [y] = yes, [ ]  = no    General: Weight gain [] ; Weight loss [ ] ; Anorexia [ ] ; Fatigue [y]; Fever [ ] ; Chills [ ] ; Weakness [  y ]   Cardiac: Chest pain/pressure [ ] ; Resting SOB [ y]; Exertional SOB [y]; Orthopnea [ y]; Pedal Edema [y]; Palpitations [ ] ; Syncope [ ] ; Presyncope [ ] ; Paroxysmal nocturnal dyspnea[y ]   Pulmonary: Cough [ ] ; Wheezing[ ] ; Hemoptysis[ ] ; Sputum [ ] ; Snoring [ ]    GI: Vomiting[ ] ; Dysphagia[ ] ; Melena[ ] ; Hematochezia [ ] ; Heartburn[ ] ; Abdominal pain [ ] ; Constipation [ ] ; Diarrhea [ ] ; BRBPR [ ]    GU: Hematuria[ ] ; Dysuria [ ] ; Nocturia[ ]   Vascular: Pain in legs with walking [ ] ; Pain in feet with lying flat [ ] ; Non-healing sores [ ] ; Stroke [ ] ; TIA [ ] ; Slurred speech [ ] ;   Neuro: Headaches[ ] ; Vertigo[ ] ; Seizures[ ] ; Paresthesias[ ] ;Blurred vision [ ] ; Diplopia [ ] ; Vision changes [ ]    Ortho/Skin: Arthritis [y]; Joint pain [y]; Muscle pain [ ] ; Joint swelling [ ] ; Back Pain [ ] ; Rash [ ]    Psych: Depression[ ] ; Anxiety[ ]    Heme: Bleeding problems [ ] ; Clotting disorders [ ] ; Anemia [ ]    Endocrine: Diabetes [ ] ; Thyroid dysfunction[ ]    Physical Exam/Data:   Vitals:   06/26/19 1400 06/26/19 1415 06/26/19 1430 06/26/19 1445  BP: 114/77 114/79 (!) 104/91 128/84  Pulse: 92 89 86 95  Resp: (!) 21 16 (!) 36 14  Temp:      TempSrc:      SpO2: 99% 99% 100% 100%  Weight:      Height:       No intake or output data in the 24 hours ending 06/26/19 1520 Last 3 Weights 06/26/2019 03/31/2013 03/07/2013  Weight (lbs) 242 lb 234 lb 12.8 oz 231 lb  Weight (kg) 109.77 kg 106.505 kg 104.781 kg     Body mass index is 39.06 kg/m.  See below   Relevant CV Studies: See HPI for echo reports and cath reports  Laboratory Data:  High Sensitivity Troponin:   Recent  Labs  Lab 06/26/19 1325  TROPONINIHS 18*      Cardiac EnzymesNo results for input(s): TROPONINI in the last 168 hours. No results for input(s): TROPIPOC in the last 168 hours.  Chemistry Recent Labs  Lab 06/26/19 1325  NA 137  K 4.4  CL 106  CO2 21*  GLUCOSE 131*  BUN 21  CREATININE 1.17  CALCIUM 8.8*  GFRNONAA 59*  GFRAA >60  ANIONGAP 10    No results for input(s): PROT, ALBUMIN, AST, ALT, ALKPHOS, BILITOT in the last 168 hours. Hematology Recent Labs  Lab 06/26/19 1325  WBC 5.0  RBC 3.79*  HGB 11.7*  HCT 35.9*  MCV 94.7  MCH 30.9  MCHC 32.6  RDW 14.5  PLT 201   BNP Recent Labs  Lab 06/26/19 1325  BNP 800.2*    DDimer No results for input(s): DDIMER in the last 168 hours.   Radiology/Studies:  No results found.  Assessment and Plan:   1. 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  BNP elevated. Admit and diuresis, check echo. 2. HTN: BP currently well controlled 3. HL: check lipids  Severity of Illness: The appropriate patient status for this patient is INPATIENT. Inpatient status is judged to be reasonable and necessary in order to provide the required intensity of service to ensure the patient's safety. The patient's presenting symptoms, physical exam findings, and initial radiographic and laboratory data in the context of their chronic comorbidities is felt to place them at high risk  for further clinical deterioration. Furthermore, it is not anticipated that the patient will be medically stable for discharge from the hospital within 2 midnights of admission. The following factors support the patient status of inpatient.   " The patient's presenting symptoms include woresening HF, DOE. " The worrisome physical exam findings include LE edema. " The initial radiographic and laboratory data are worrisome because of pulmonary edema. " The chronic co-morbidities include HTN.   * I certify that at the point of admission it is my  clinical judgment that the patient will require inpatient hospital care spanning beyond 2 midnights from the point of admission due to high intensity of service, high risk for further deterioration and high frequency of surveillance required.*    For questions or updates, please contact [facility] Please consult [email or web address] for contact info under      Signed, Nada BoozerLaura Ingold, NP  06/26/2019 3:20 PM   Patient seen and examined with the above-signed Advanced Practice Provider and/or Housestaff. I personally reviewed laboratory data, imaging studies and relevant notes. I independently examined the patient and formulated the important aspects of the plan. I have edited the note to reflect any of my changes or salient points. I have personally discussed the plan with the patient and/or family.  78 y/o male with h/o HTN, HL, DM, obesity and systolic HF due to NICM. Last echo 2014 with EF 25% with severe biventricular dysfunction. Normal coronaries on cath. Has not followed up with cardiology since 2014. Has been seeing PCP and taking HF meds. Presents with 1-2 weeks increasing HF symptoms. On exam mild volume overload with cool extremities. Will start IV lasix. Repeat echo. If not responding well may need R/L cath.   General:  Elderly. No resp difficulty HEENT: normal Neck: supple. JVP to ear. Carotids 2+ bilat; no bruits. No lymphadenopathy or thryomegaly appreciated. Cor: PMI nondisplaced. Regular rate & rhythm. No rubs, gallops or murmurs. Lungs: crackles at bases Abdomen: obese soft, nontender, nondistended. No hepatosplenomegaly. No bruits or masses. Good bowel sounds. Extremities: no cyanosis, clubbing, rash, 1-2+ edema cool Neuro: alert & orientedx3, cranial nerves grossly intact. moves all 4 extremities w/o difficulty. Affect pleasant   Plan: 1. Admit for HF exacerbation 2_Lasix 80 IV bid 3. Repeat echo.  4. Switch lisinopril to losartan 25 5. Continue spiro and low-dose carvedilol   6. May need repeat R/L cath depending on course.  7. Consider cMRI +/- PYP  Arvilla Meresaniel Mazel Villela, MD  4:29 PM

## 2019-06-26 NOTE — Progress Notes (Signed)
VASCULAR LAB PRELIMINARY  PRELIMINARY  PRELIMINARY  PRELIMINARY  Left lower extremity venous duplex completed.    Preliminary report:  See CV proc for preliminary results.   Gave report to Eliezer Mccoy, PA-C  Isatu Macinnes, RVT 06/26/2019, 3:32 PM

## 2019-06-27 ENCOUNTER — Inpatient Hospital Stay (HOSPITAL_BASED_OUTPATIENT_CLINIC_OR_DEPARTMENT_OTHER): Payer: Medicare HMO

## 2019-06-27 DIAGNOSIS — I371 Nonrheumatic pulmonary valve insufficiency: Secondary | ICD-10-CM

## 2019-06-27 DIAGNOSIS — I5023 Acute on chronic systolic (congestive) heart failure: Secondary | ICD-10-CM | POA: Diagnosis not present

## 2019-06-27 DIAGNOSIS — I361 Nonrheumatic tricuspid (valve) insufficiency: Secondary | ICD-10-CM

## 2019-06-27 LAB — BASIC METABOLIC PANEL
Anion gap: 13 (ref 5–15)
BUN: 22 mg/dL (ref 8–23)
CO2: 23 mmol/L (ref 22–32)
Calcium: 9.1 mg/dL (ref 8.9–10.3)
Chloride: 104 mmol/L (ref 98–111)
Creatinine, Ser: 1.25 mg/dL — ABNORMAL HIGH (ref 0.61–1.24)
GFR calc Af Amer: >60 mL/min (ref >60)
GFR calc non Af Amer: 55 mL/min — ABNORMAL LOW (ref >60)
Glucose, Bld: 101 mg/dL — ABNORMAL HIGH (ref 70–99)
Potassium: 4.3 mmol/L (ref 3.5–5.1)
Sodium: 140 mmol/L (ref 135–145)

## 2019-06-27 LAB — GLUCOSE, CAPILLARY
Glucose-Capillary: 94 mg/dL (ref 70–99)
Glucose-Capillary: 148 mg/dL — ABNORMAL HIGH (ref 70–99)
Glucose-Capillary: 89 mg/dL (ref 70–99)

## 2019-06-27 LAB — ECHOCARDIOGRAM COMPLETE
Height: 66 in
Weight: 3484.8 oz

## 2019-06-27 MED ORDER — FUROSEMIDE 20 MG PO TABS: 40.0000 mg | ORAL_TABLET | Freq: Every day | ORAL | 0 refills | Status: DC

## 2019-06-27 MED ORDER — CARVEDILOL 3.125 MG PO TABS: 3.1250 mg | ORAL_TABLET | Freq: Two times a day (BID) | ORAL | 6 refills | Status: DC

## 2019-06-27 MED ORDER — TAMSULOSIN HCL 0.4 MG PO CAPS
0.4000 mg | ORAL_CAPSULE | Freq: Every day | ORAL | Status: DC
  Administered 2019-06-27: 0.4 mg via ORAL
  Filled 2019-06-27: qty 1

## 2019-06-27 MED ORDER — INSULIN ASPART 100 UNIT/ML ~~LOC~~ SOLN: 0.0000 [IU] | Freq: Three times a day (TID) | SUBCUTANEOUS | Status: DC

## 2019-06-27 MED ORDER — SPIRONOLACTONE 25 MG PO TABS
25.0000 mg | ORAL_TABLET | Freq: Every day | ORAL | Status: DC
  Administered 2019-06-27: 25 mg via ORAL
  Filled 2019-06-27: qty 1

## 2019-06-27 MED ORDER — CARVEDILOL 3.125 MG PO TABS
3.1250 mg | ORAL_TABLET | Freq: Two times a day (BID) | ORAL | Status: DC
  Administered 2019-06-27: 3.125 mg via ORAL
  Filled 2019-06-27 (×2): qty 1

## 2019-06-27 MED ORDER — SIMVASTATIN 20 MG PO TABS
40.0000 mg | ORAL_TABLET | Freq: Every day | ORAL | Status: DC
  Administered 2019-06-27: 40 mg via ORAL
  Filled 2019-06-27: qty 2

## 2019-06-27 MED ORDER — INSULIN ASPART 100 UNIT/ML ~~LOC~~ SOLN: 0.0000 [IU] | Freq: Every day | SUBCUTANEOUS | Status: DC

## 2019-06-27 MED ORDER — LISINOPRIL 5 MG PO TABS: 2.5000 mg | ORAL_TABLET | Freq: Every day | ORAL | Status: DC

## 2019-06-27 NOTE — Discharge Summary (Addendum)
Advanced Heart Failure Team  Discharge Summary   Patient ID: Trevor Rodriguez MRN: [id number], DOB/AGE: Jul 01, 1941 78 y.o. Admit date: 06/26/2019 D/C date:     06/27/2019   Primary Discharge Diagnoses:  1. A/C Systolic Heart Failure  2. Chest Tightness   Hospital Course:  Trevor Rodriguez is a 78 y.o. male with hx of CHF, CM with EF 10% on Echo 2011 and on cath 2011 minimal irregularities, HTN admitted with chest tightness and shortness of breath. Not thought to be ACS. HS Troponin 18>23. No EKG changes. An ECHO was completed and showed EF 25-30% moderate MR which was similar to previous ECHO in 2014. Diuresed with  IV lasix and transitioned to lasix 40 mg daily. HF medications adjusted.    He will continue to be followed closely in the HF clinic. Plan to check BMET at post hospital visit.   Discharge Weight: 217.8 pounds.  Discharge Vitals: Blood pressure (!) 104/58, pulse 76, temperature (!) 97.4 F (36.3 C), temperature source Oral, resp. rate 18, height 5\' 6"  (1.676 m), weight 98.8 kg, SpO2 100 %.  General:  No resp difficulty HEENT: normal Neck: supple. no JVD. Carotids 2+ bilat; no bruits. No lymphadenopathy or thryomegaly appreciated. Cor: PMI nondisplaced. Regular rate & rhythm. 2/6 MR Lungs: clear Abdomen: soft, nontender, nondistended. No hepatosplenomegaly. No bruits or masses. Good bowel sounds. Extremities: no cyanosis, clubbing, rash, edema Neuro: alert & orientedx3, cranial nerves grossly intact. moves all 4 extremities w/o difficulty. Affect pleasant  Labs: Lab Results  Component Value Date   WBC 5.0 06/26/2019   HGB 11.7 (L) 06/26/2019   HCT 35.9 (L) 06/26/2019   MCV 94.7 06/26/2019   PLT 201 06/26/2019    Recent Labs  Lab 06/27/19 0614  NA 140  K 4.3  CL 104  CO2 23  BUN 22  CREATININE 1.25*  CALCIUM 9.1  GLUCOSE 101*   Lab Results  Component Value Date   CHOL 99 02/21/2013   HDL 5 (L) 02/21/2013   LDLCALC 64 02/21/2013   TRIG 148 02/21/2013   BNP  (last 3 results) Recent Labs    06/26/19 1325  BNP 800.2*    ProBNP (last 3 results) No results for input(s): PROBNP in the last 8760 hours.   Diagnostic Studies/Procedures   Dg Chest 2 View  Result Date: 06/26/2019 CLINICAL DATA:  Shortness of breath. EXAM: CHEST - 2 VIEW COMPARISON:  Radiograph of February 20, 2013. FINDINGS: Stable cardiomegaly. No pneumothorax or pleural effusion is noted. Left lung is clear. Minimal right basilar edema may be present. Bony thorax is unremarkable. IMPRESSION: Minimal right basilar edema may be present. Electronically Signed   By: Lupita Raider M.D.   On: 06/26/2019 16:01   Vas [place] Lower Extremity Venous (dvt) (only Mc & Wl 7a-7p)  Result Date: 06/26/2019  Lower Venous Study Indications: Edema.  Risk Factors: History of reduced EF and CHF. Limitations: Edema, low flow to lower extremities. Comparison Study: Prior negative LLEV study from 09/18/16 Performing Technologist: Sherren Kerns RVS  Examination Guidelines: A complete evaluation includes B-mode imaging, spectral Doppler, color Doppler, and power Doppler as needed of all accessible portions of each vessel. Bilateral testing is considered an integral part of a complete examination. Limited examinations for reoccurring indications may be performed as noted.  +-----+---------------+---------+-----------+----------+---------+ RIGHTCompressibilityPhasicitySpontaneityPropertiesSummary   +-----+---------------+---------+-----------+----------+---------+ CFV  Full  pulsatile +-----+---------------+---------+-----------+----------+---------+   +---------+---------------+---------+-----------+----------+---------+ LEFT     CompressibilityPhasicitySpontaneityPropertiesSummary   +---------+---------------+---------+-----------+----------+---------+ CFV      Full                                         pulsatile  +---------+---------------+---------+-----------+----------+---------+ SFJ      Full                                                   +---------+---------------+---------+-----------+----------+---------+ FV Prox  Full                                                   +---------+---------------+---------+-----------+----------+---------+ FV Mid   Full                                                   +---------+---------------+---------+-----------+----------+---------+ FV DistalFull                                                   +---------+---------------+---------+-----------+----------+---------+ PFV      Full                                                   +---------+---------------+---------+-----------+----------+---------+ POP      Full                                         pulsatile +---------+---------------+---------+-----------+----------+---------+ PTV      Full                                                   +---------+---------------+---------+-----------+----------+---------+ PERO     Full                                                   +---------+---------------+---------+-----------+----------+---------+     Summary: Right: No evidence of common femoral vein obstruction. Left: Findings appear essentially unchanged compared to previous examination. There is no evidence of deep vein thrombosis in the lower extremity.  *See table(s) above for measurements and observations.    Preliminary     Discharge Medications   Allergies as of 06/27/2019      Reactions   Ibuprofen Nausea Only      Medication List    STOP taking these medications   oxyCODONE-acetaminophen 5-325 MG tablet Commonly known as:  PERCOCET/ROXICET   traMADol 50 MG tablet Commonly known as: ULTRAM     TAKE these medications   aspirin 81 MG EC tablet Take 81 mg by mouth daily.   carvedilol 3.125 MG tablet Commonly known as: COREG Take 1 tablet (3.125  mg total) by mouth 2 (two) times daily with a meal. What changed:   medication strength  how much to take   colchicine 0.6 MG tablet Take 0.6 mg by mouth as needed. Gout flare up   furosemide 20 MG tablet Commonly known as: LASIX Take 2 tablets (40 mg total) by mouth daily. What changed: how much to take   lisinopril 2.5 MG tablet Commonly known as: ZESTRIL Take 1 tablet (2.5 mg total) by mouth daily.   metFORMIN 500 MG tablet Commonly known as: Glucophage Take 1 tablet (500 mg total) by mouth 2 (two) times daily with a meal. What changed: when to take this   ReliOn Prime Monitor Devi   ReliOn Prime Test test strip Generic drug: glucose blood   ReliOn Ultra Thin Plus Lancets Misc   simvastatin 40 MG tablet Commonly known as: ZOCOR TAKE ONE TABLET BY MOUTH EVERY DAY What changed: when to take this   spironolactone 25 MG tablet Commonly known as: ALDACTONE Take 25 mg by mouth daily at 12 noon.   tamsulosin 0.4 MG Caps capsule Commonly known as: FLOMAX TAKE ONE CAPSULE BY MOUTH EVERY DAY What changed: when to take this       Disposition   The patient will be discharged in stable condition to home.  Follow-up Information    [facility] Follow up on 07/19/2019.   Specialty: Cardiology Why: at [place] information: [address] [id number] [place] [phone]            Duration of Discharge Encounter: Greater than 35 minutes   Signed, Darrick Grinder NP-C  06/27/2019, 1:25 PM   Patient seen and examined with Darrick Grinder, NP. We discussed all aspects of the encounter. I agree with the assessment and plan as stated above.    Much improved with diuresis. He wants to go home. Ef stable. BP too low for Entresto. Can go home today with close f/u in HF Clinic.   Glori Bickers, MD  1:32 PM

## 2019-06-27 NOTE — Progress Notes (Signed)
*   Echocardiogram 2D Echocardiogram has been performed.  Trevor Rodriguez 06/27/2019, 9:24 AM

## 2019-06-27 NOTE — Care Management CC44 (Signed)
Condition Code 44 Documentation Completed  Patient Details  Name: Trevor Rodriguez MRN: [id number] Date of Birth: [birth year]   Condition Code 44 given:  Yes Patient signature on Condition Code 44 notice:  Yes Documentation of 2 MD's agreement:  Yes Code 44 added to claim:  Yes    Candie Chroman, LCSW 06/27/2019, 10:08 AM

## 2019-07-04 ENCOUNTER — Other Ambulatory Visit (HOSPITAL_COMMUNITY): Payer: Self-pay | Admitting: Adult Health

## 2019-07-19 ENCOUNTER — Other Ambulatory Visit: Payer: Self-pay

## 2019-07-19 ENCOUNTER — Ambulatory Visit (HOSPITAL_COMMUNITY)
Admission: RE | Admit: 2019-07-19 | Discharge: 2019-07-19 | Disposition: A | Discharge status: Home / Self Care | Payer: Medicare HMO | Source: Ambulatory Visit | Attending: Cardiology | Admitting: Cardiology

## 2019-07-19 VITALS — BP 101/65 | HR 85 | Wt 210.4 lb

## 2019-07-19 DIAGNOSIS — I110 Hypertensive heart disease with heart failure: Secondary | ICD-10-CM | POA: Insufficient documentation

## 2019-07-19 DIAGNOSIS — M109 Gout, unspecified: Secondary | ICD-10-CM | POA: Insufficient documentation

## 2019-07-19 DIAGNOSIS — Z87891 Personal history of nicotine dependence: Secondary | ICD-10-CM | POA: Diagnosis not present

## 2019-07-19 DIAGNOSIS — I10 Essential (primary) hypertension: Secondary | ICD-10-CM

## 2019-07-19 DIAGNOSIS — Z8249 Family history of ischemic heart disease and other diseases of the circulatory system: Secondary | ICD-10-CM | POA: Insufficient documentation

## 2019-07-19 DIAGNOSIS — Z809 Family history of malignant neoplasm, unspecified: Secondary | ICD-10-CM | POA: Diagnosis not present

## 2019-07-19 DIAGNOSIS — Z79899 Other long term (current) drug therapy: Secondary | ICD-10-CM | POA: Diagnosis not present

## 2019-07-19 DIAGNOSIS — Z7982 Long term (current) use of aspirin: Secondary | ICD-10-CM | POA: Diagnosis not present

## 2019-07-19 DIAGNOSIS — I5022 Chronic systolic (congestive) heart failure: Secondary | ICD-10-CM | POA: Diagnosis not present

## 2019-07-19 DIAGNOSIS — E785 Hyperlipidemia, unspecified: Secondary | ICD-10-CM | POA: Diagnosis not present

## 2019-07-19 DIAGNOSIS — I428 Other cardiomyopathies: Secondary | ICD-10-CM | POA: Insufficient documentation

## 2019-07-19 DIAGNOSIS — Z7984 Long term (current) use of oral hypoglycemic drugs: Secondary | ICD-10-CM | POA: Insufficient documentation

## 2019-07-19 LAB — BASIC METABOLIC PANEL
Anion gap: 10 (ref 5–15)
BUN: 18 mg/dL (ref 8–23)
CO2: 23 mmol/L (ref 22–32)
Calcium: 9.4 mg/dL (ref 8.9–10.3)
Chloride: 101 mmol/L (ref 98–111)
Creatinine, Ser: 1.01 mg/dL (ref 0.61–1.24)
GFR calc Af Amer: >60 mL/min (ref >60)
GFR calc non Af Amer: >60 mL/min (ref >60)
Glucose, Bld: 93 mg/dL (ref 70–99)
Potassium: 4.2 mmol/L (ref 3.5–5.1)
Sodium: 134 mmol/L — ABNORMAL LOW (ref 135–145)

## 2019-07-19 MED ORDER — FUROSEMIDE 20 MG PO TABS: 40.0000 mg | ORAL_TABLET | Freq: Every day | ORAL | 0 refills | Status: DC

## 2019-07-19 NOTE — Progress Notes (Signed)
PCP: Marletta Lor DNP  Primary HF Cardiologist: Dr Gala Romney   HPI: Trevor Rodriguez a 78 y.o.malewith hx of CHF, CM with EF 10% on Echo 2011 and on cath 2011 minimal irregularities, and HTN.   Prior hx with Echo in 2011 with EF 10% and diffuse hypokinesis, mild MR, LA moderately dilated. RV systolic function severely reduced. PA pk pressure 49 mmHg.  Cardiac cath with minimal irregularities.  Echo 2014 with EF 25-30%  LV moderately dilated, trivial AR, mild to mod MR, RA mildly dilated, PA pk pressure 39 mmHg.   Previously followed by Dr Gala Romney but was lost to follow up.   Admitted 7/5/20with chest tightness and volume overload. Not thought to be ACS. Diuresed with IV lasix and changed po lasix. Discharged to home the next day.   Today he returns for post hospital follow up. Overall feeling fine. Denies SOB/PND/Orthopnea. Appetite ok. No fever or chills. He has not been weighing at home. Taking all medications. Lives alone.   Cardiac Testing ECHO 06/2019-EF 25-30% , restrictive filling, RV normal   LHC 2011 with minimal irregularities  ROS: All systems negative except as listed in HPI, PMH and Problem List.  SH:  Social History   Socioeconomic History  . Marital status: Widowed    Spouse name: Not on file  . Number of children: Not on file  . Years of education: Not on file  . Highest education level: Not on file  Occupational History  . Not on file  Social Needs  . Financial resource strain: Not on file  . Food insecurity    Worry: Not on file    Inability: Not on file  . Transportation needs    Medical: Not on file    Non-medical: Not on file  Tobacco Use  . Smoking status: Former Smoker    Packs/day: 0.50    Types: Cigarettes    Quit date: 03/07/1993    Years since quitting: 26.3  Substance and Sexual Activity  . Alcohol use: No  . Drug use: No  . Sexual activity: Not on file  Lifestyle  . Physical activity    Days per week: Not on file    Minutes per  session: Not on file  . Stress: Not on file  Relationships  . Social Musician on phone: Not on file    Gets together: Not on file    Attends religious service: Not on file    Active member of club or organization: Not on file    Attends meetings of clubs or organizations: Not on file    Relationship status: Not on file  . Intimate partner violence    Fear of current or ex partner: Not on file    Emotionally abused: Not on file    Physically abused: Not on file    Forced sexual activity: Not on file  Other Topics Concern  . Not on file  Social History Narrative  . Not on file    FH:  Family History  Problem Relation Age of Onset  . Cancer Mother   . Heart disease Father     Past Medical History:  Diagnosis Date  . Congestive heart failure (HCC)    Ejection fraction around 15%. He was recently horpitalized for heart failure and had a heart catheterization. He had minor luminal irregularities.  . Hyperlipidemia   . Hypertension     Current Outpatient Medications  Medication Sig Dispense Refill  . aspirin 81 MG EC  tablet Take 81 mg by mouth daily.     . Blood Glucose Monitoring Suppl (Datto) DEVI     . carvedilol (COREG) 3.125 MG tablet Take 1 tablet (3.125 mg total) by mouth 2 (two) times daily with a meal. 60 tablet 6  . colchicine 0.6 MG tablet Take 0.6 mg by mouth as needed. Gout flare up    . furosemide (LASIX) 20 MG tablet TAKE 2 TABLETS BY MOUTH EVERY DAY 30 tablet 0  . lisinopril (PRINIVIL,ZESTRIL) 2.5 MG tablet Take 1 tablet (2.5 mg total) by mouth daily. 30 tablet 0  . metFORMIN (GLUCOPHAGE) 500 MG tablet Take 1 tablet (500 mg total) by mouth 2 (two) times daily with a meal. (Patient taking differently: Take 500 mg by mouth daily at 12 noon. ) 60 tablet 0  . RELION PRIME TEST test strip     . RELION ULTRA THIN PLUS LANCETS MISC     . simvastatin (ZOCOR) 40 MG tablet TAKE ONE TABLET BY MOUTH EVERY DAY (Patient taking differently: Take 40  mg by mouth daily at 12 noon. ) 15 tablet 0  . spironolactone (ALDACTONE) 25 MG tablet Take 25 mg by mouth daily at 12 noon.     . Tamsulosin HCl (FLOMAX) 0.4 MG CAPS TAKE ONE CAPSULE BY MOUTH EVERY DAY (Patient taking differently: Take 0.4 mg by mouth daily at 12 noon. ) 30 capsule 0   No current facility-administered medications for this encounter.     Vitals:   07/19/19 0958  BP: 101/65  Pulse: 85  SpO2: 99%  Weight: 95.4 kg (210 lb 6.4 oz)   Wt Readings from Last 3 Encounters:  07/19/19 95.4 kg (210 lb 6.4 oz)  06/27/19 98.8 kg (217 lb 12.8 oz)  03/31/13 106.5 kg (234 lb 12.8 oz)    PHYSICAL EXAM: General:  Well appearing. No resp difficulty HEENT: normal Neck: supple. JVP flat. Carotids 2+ bilaterally; no bruits. No lymphadenopathy or thryomegaly appreciated. Cor: PMI normal. Regular rate & rhythm. No rubs, gallops or murmurs. Lungs: clear Abdomen: soft, nontender, nondistended. No hepatosplenomegaly. No bruits or masses. Good bowel sounds. Extremities: no cyanosis, clubbing, rash, edema Neuro: alert & orientedx3, cranial nerves grossly intact. Moves all 4 extremities w/o difficulty. Affect pleasant.   ASSESSMENT & PLAN:  1. Chronic Systolic Heart Failure , NICM ECHO 2020 EF 25-30%. He does not have an ICD but he is not sure he is interested.  NYHA II. Volume status stable.  Continue current dose of spironolactone, lisinopril, carvedilol, and lasix. No room to titrate HF meds with soft BP.  Check BMEt   2. HTN  Stable.   Follow up with Dr Haroldine Laws in 3-4 weeks. He will need repeat ECHO down the road. I considered entresto however SBP is soft so not sure he could handle it. If we add down the road we will need to cut back lasix.    Amy Clegg NP_C  3:49 PM

## 2019-07-19 NOTE — Patient Instructions (Signed)
Lab work done today. We will notify you of any abnormal lab work. No news is good news.  MAY TAKE an extra tab of Furosemide as needed for weight gain of 3lbs or greater.  Please follow up with the [facility] in 6 weeks.  At the [facility], you and your health needs are our priority. As part of our continuing mission to provide you with exceptional heart care, we have created designated Provider Care Teams. These Care Teams include your primary Cardiologist (physician) and Advanced Practice Providers (APPs- Physician Assistants and Nurse Practitioners) who all work together to provide you with the care you need, when you need it.   You may see any of the following providers on your designated Care Team at your next follow up: Marland Kitchen Dr Glori Bickers . Dr Loralie Champagne . Darrick Grinder, NP

## 2019-08-29 NOTE — Progress Notes (Signed)
Advanced Heart Failure Clinic Note   Referring Physician: PCP: Corine Shelter, MD PCP-Cardiologist: No primary care provider on file.   HPI:  Trevor Rodriguez is a 78 y.o. 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 RV systolic function severely reduced. PA pk pressure 49 mmHg.  Cardiac cath with minimal irregularities.  Echo 2014 with EF 25-30%  LV moderately dilated, trivial AR, mild to mod MR, RA mildly dilated, PA pk pressure 39 mmHg   Had been lost to f/u since 2014 and admitted to [facility] in 06/[facility]/19 with CP and HF. Trop negative, An ECHO was completed and showed EF 25-30% moderate MR which was similar to previous ECHO in 2014. Diuresed with  IV lasix and transitioned to lasix 40 mg daily.HF medications adjusted. Discharged 06/27/19 with D/c weight 217 pounds. BP too low for Entresto at time of d/c  He returns for routine f/u. Says he feels much better. Getting around the house and works on [employer] and cuts grass without problem. No CP, SOB, orthopnea or PND. No edema. Taking all meds. Weight stable at 216.     Past Medical History:  Diagnosis Date  . Congestive heart failure (HCC)    Ejection fraction around 15%. He was recently horpitalized for heart failure and had a heart catheterization. He had minor luminal irregularities.  . Hyperlipidemia   . Hypertension     Current Outpatient Medications  Medication Sig Dispense Refill  . aspirin 81 MG EC tablet Take 81 mg by mouth daily.     . Blood Glucose Monitoring Suppl (RELION PRIME MONITOR) DEVI     . carvedilol (COREG) 3.125 MG tablet Take 1 tablet (3.125 mg total) by mouth 2 (two) times daily with a meal. 60 tablet 6  . colchicine 0.6 MG tablet Take 0.6 mg by mouth as needed. Gout flare up    . furosemide (LASIX) 20 MG tablet Take 2 tablets (40 mg total) by mouth daily.  30 tablet 0  . lisinopril (PRINIVIL,ZESTRIL) 2.5 MG tablet Take 1 tablet (2.5 mg total) by mouth daily. 30 tablet 0  . metFORMIN (GLUCOPHAGE) 500 MG tablet Take 1 tablet (500 mg total) by mouth 2 (two) times daily with a meal. (Patient taking differently: Take 500 mg by mouth daily at 12 noon. ) 60 tablet 0  . RELION PRIME TEST test strip     . RELION ULTRA THIN PLUS LANCETS MISC     . simvastatin (ZOCOR) 40 MG tablet TAKE ONE TABLET BY MOUTH EVERY DAY (Patient taking differently: Take 40 mg by mouth daily at 12 noon. ) 15 tablet 0  . spironolactone (ALDACTONE) 25 MG tablet Take 25 mg by mouth daily at 12 noon.     . Tamsulosin HCl (FLOMAX) 0.4 MG CAPS TAKE ONE CAPSULE BY MOUTH EVERY DAY (Patient taking differently: Take 0.4 mg by mouth daily at 12 noon. ) 30 capsule 0   No current facility-administered medications for this encounter.     Allergies  Allergen Reactions  . Ibuprofen Nausea Only      Social History   Socioeconomic History  . Marital status: Widowed    Spouse name: Not on file  . Number of children: Not on file  . Years of education: Not on file  . Highest education level: Not on file  Occupational History  .  Not on file  Social Needs  . Financial resource strain: Not on file  . Food insecurity    Worry: Not on file    Inability: Not on file  . Transportation needs    Medical: Not on file    Non-medical: Not on file  Tobacco Use  . Smoking status: Former Smoker    Packs/day: 0.50    Types: Cigarettes    Quit date: 03/07/1993    Years since quitting: 26.4  Substance and Sexual Activity  . Alcohol use: No  . Drug use: No  . Sexual activity: Not on file  Lifestyle  . Physical activity    Days per week: Not on file    Minutes per session: Not on file  . Stress: Not on file  Relationships  . Social Herbalist on phone: Not on file    Gets together: Not on file    Attends religious service: Not on file    Active member of club or organization:  Not on file    Attends meetings of clubs or organizations: Not on file    Relationship status: Not on file  . Intimate partner violence    Fear of current or ex partner: Not on file    Emotionally abused: Not on file    Physically abused: Not on file    Forced sexual activity: Not on file  Other Topics Concern  . Not on file  Social History Narrative  . Not on file      Family History  Problem Relation Age of Onset  . Cancer Mother   . Heart disease Father     Vitals:   08/30/19 0923  BP: 125/65  Pulse: 88  SpO2: 97%  Weight: 118.4 kg (261 lb)     PHYSICAL EXAM: General:  Well appearing. No respiratory difficulty HEENT: normal Neck: supple. no JVD. Carotids 2+ bilat; no bruits. No lymphadenopathy or thyromegaly appreciated. Cor: PMI nondisplaced. Regular rate & rhythm. Soft MR  Lungs: clear Abdomen: soft, nontender, nondistended. No hepatosplenomegaly. No bruits or masses. Good bowel sounds. Extremities: no cyanosis, clubbing, rash, edema Neuro: alert & oriented x 3, cranial nerves grossly intact. moves all 4 extremities w/o difficulty. Affect pleasant.    ASSESSMENT & PLAN: 1. Chronic Systolic Heart Failure  - due to NICM echo 2011 EF 10-15%. Cath 2011 minimal CAD - Echo 2014 EF 25-30% - ECHO 06/2019 EF 25-30% mod MR. He does not have an ICD but he is not sure he is interested.  - Much improved. NYHA II. Volume status stable.  - On lisinopril 2.5 daily - we discussed Entresto. He wants to know more about cost. Will ask PharmD to see. If affordable will start 24/26 bid after washout and cut lasix to 20 daily - Continue carvedilol 3.125 bid - Continue spiro 25 - Continue lasix. - Check cMRI to exclude infiltrative process and reassess EF. If EF , 35% consider ICD referral  - Labs today  2. HTN  - BP well controlled  3. DM2 - consider SGLT2i  4. MR - moderate on last echo. Continue to follow Does not sound severe on exam   Glori Bickers, MD 08/29/19

## 2019-08-30 ENCOUNTER — Other Ambulatory Visit: Payer: Self-pay

## 2019-08-30 ENCOUNTER — Ambulatory Visit (HOSPITAL_COMMUNITY)
Admission: RE | Admit: 2019-08-30 | Discharge: 2019-08-30 | Disposition: A | Discharge status: Home / Self Care | Payer: Medicare HMO | Source: Ambulatory Visit | Attending: Internal Medicine | Admitting: Internal Medicine

## 2019-08-30 VITALS — BP 125/65 | HR 88 | Wt 261.0 lb

## 2019-08-30 DIAGNOSIS — Z7982 Long term (current) use of aspirin: Secondary | ICD-10-CM | POA: Insufficient documentation

## 2019-08-30 DIAGNOSIS — Z7984 Long term (current) use of oral hypoglycemic drugs: Secondary | ICD-10-CM | POA: Insufficient documentation

## 2019-08-30 DIAGNOSIS — E785 Hyperlipidemia, unspecified: Secondary | ICD-10-CM | POA: Diagnosis not present

## 2019-08-30 DIAGNOSIS — I428 Other cardiomyopathies: Secondary | ICD-10-CM | POA: Diagnosis not present

## 2019-08-30 DIAGNOSIS — Z79899 Other long term (current) drug therapy: Secondary | ICD-10-CM | POA: Diagnosis not present

## 2019-08-30 DIAGNOSIS — I2510 Atherosclerotic heart disease of native coronary artery without angina pectoris: Secondary | ICD-10-CM | POA: Insufficient documentation

## 2019-08-30 DIAGNOSIS — Z8249 Family history of ischemic heart disease and other diseases of the circulatory system: Secondary | ICD-10-CM | POA: Insufficient documentation

## 2019-08-30 DIAGNOSIS — Z87891 Personal history of nicotine dependence: Secondary | ICD-10-CM | POA: Insufficient documentation

## 2019-08-30 DIAGNOSIS — E119 Type 2 diabetes mellitus without complications: Secondary | ICD-10-CM | POA: Diagnosis not present

## 2019-08-30 DIAGNOSIS — I5022 Chronic systolic (congestive) heart failure: Secondary | ICD-10-CM | POA: Diagnosis present

## 2019-08-30 DIAGNOSIS — I110 Hypertensive heart disease with heart failure: Secondary | ICD-10-CM | POA: Insufficient documentation

## 2019-08-30 LAB — COMPREHENSIVE METABOLIC PANEL
ALT: 19 U/L (ref 0–44)
AST: 16 U/L (ref 15–41)
Albumin: 3.5 g/dL (ref 3.5–5.0)
Alkaline Phosphatase: 29 U/L — ABNORMAL LOW (ref 38–126)
Anion gap: 10 (ref 5–15)
BUN: 13 mg/dL (ref 8–23)
CO2: 26 mmol/L (ref 22–32)
Calcium: 9 mg/dL (ref 8.9–10.3)
Chloride: 103 mmol/L (ref 98–111)
Creatinine, Ser: 0.98 mg/dL (ref 0.61–1.24)
GFR calc Af Amer: >60 mL/min (ref >60)
GFR calc non Af Amer: >60 mL/min (ref >60)
Glucose, Bld: 100 mg/dL — ABNORMAL HIGH (ref 70–99)
Potassium: 3.6 mmol/L (ref 3.5–5.1)
Sodium: 139 mmol/L (ref 135–145)
Total Bilirubin: 0.7 mg/dL (ref 0.3–1.2)
Total Protein: 6.5 g/dL (ref 6.5–8.1)

## 2019-08-30 LAB — BRAIN NATRIURETIC PEPTIDE: B Natriuretic Peptide: 690.3 pg/mL — ABNORMAL HIGH (ref 0.0–100.0)

## 2019-08-30 NOTE — Patient Instructions (Signed)
Continue your Lisinopril for now, we will check on the cost of the Entresto and let you that before we decide to change your medications  Labs done today  Your physician has requested that you have a cardiac MRI. Cardiac MRI uses a computer to create images of your heart as its beating, producing both still and moving pictures of your heart and major blood vessels. For further information please visit [email or web address] Please follow the instruction sheet given to you today for more information.  ONCE THIS IS APPROVED BY YOUR INSURANCE COMPANY WE WILL CALL YOU TO SCHEDULE THIS.  Your physician recommends that you schedule a follow-up appointment in: 3 months

## 2019-08-31 ENCOUNTER — Telehealth (HOSPITAL_COMMUNITY): Payer: Self-pay | Admitting: Pharmacy Technician

## 2019-08-31 MED ORDER — ENTRESTO 24-26 MG PO TABS: 1.0000 | ORAL_TABLET | Freq: Two times a day (BID) | ORAL | 3 refills | Status: DC

## 2019-08-31 NOTE — Addendum Note (Signed)
Encounter addended by: Scarlette Calico, RN on: 08/31/2019 8:22 AM  Actions taken: Order list changed, Clinical Note Signed

## 2019-08-31 NOTE — Progress Notes (Signed)
Order for Entresto 24/26 mg BID placed so that we can run it through insurance to determine cost for patient, he will not start this medication until we tell him to do so.  Pt was aware of this at [place] 9/8 and it is on his AVS

## 2019-08-31 NOTE — Telephone Encounter (Signed)
Received notification from [facility] that prior authorization for Trevor Rodriguez is required.  PA submitted on CoverMyMeds Key  [id number] Status is pending.  Will continue to follow.

## 2019-09-01 NOTE — Telephone Encounter (Signed)
Advanced Heart Failure Patient Advocate Encounter  Prior Authorization for Entresto 24-26mg  has been approved.     Effective dates: 08/31/2019 through 12/22/2019  Patients co-pay is $45.00 for 30 day supply  Called patient to notify of approval and see if co-pay is affordable. Phone rang until it went busy.   Will attempt again.  Charlann Boxer, CPhT

## 2019-09-06 ENCOUNTER — Other Ambulatory Visit (HOSPITAL_COMMUNITY): Payer: Self-pay | Admitting: *Deleted

## 2019-09-06 DIAGNOSIS — I5022 Chronic systolic (congestive) heart failure: Secondary | ICD-10-CM

## 2019-09-08 NOTE — Telephone Encounter (Signed)
Have attempted to call patient a few times this week with no answer.   Hopefully if patient has financial issues he will call.  Charlann Boxer, CPhT

## 2019-10-19 ENCOUNTER — Telehealth (HOSPITAL_COMMUNITY): Payer: Self-pay

## 2019-10-19 NOTE — Telephone Encounter (Signed)
Received a call from patients daughter [name] stating that the patient was having some increased swelling. She states that they did contact patients pcp and they did increase his Lasix for 3 days and told him to follow up with [place].   appt was scheduled for 11/2 at 9:30am with app clinic.

## 2019-10-23 NOTE — Progress Notes (Signed)
Advanced Heart Failure Clinic Note   PCP: Marletta Lor PCP-Cardiologist: Dr Gala Romney  HPI: Trevor Rodriguez is a 78 y.o. 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 RV systolic function severely reduced. PA pk pressure 49 mmHg.  Cardiac cath with minimal irregularities.  Echo 2014 with EF 25-30%  LV moderately dilated, trivial AR, mild to mod MR, RA mildly dilated, PA pk pressure 39 mmHg   Had been lost to f/u since 2014 and [facility] with CP and HF. Trop negative, An ECHO was completed and showed EF 25-30% moderate MR which was similar to previous ECHO in 2014. Diuresed with  IV lasix and transitioned to lasix 40 mg daily.HF medications adjusted. Discharged 06/27/19 with D/c weight 217 pounds. BP too low for Entresto at time of d/c  Today he returns for HF follow up. Last visit he was started on entresto and instructed to stop lisinopril. He continued to take lisinopril  + entresto. Overall feeling ok. SOB with exertion.  Denies PND/Orthopnea. Having increased leg edema. Appetite ok. No fever or chills. Weight at home trending from 220--->229 pounds. Taking all medications.    Past Medical History:  Diagnosis Date  . Congestive heart failure (HCC)    Ejection fraction around 15%. He was recently horpitalized for heart failure and had a heart catheterization. He had minor luminal irregularities.  . Hyperlipidemia   . Hypertension     Current Outpatient Medications  Medication Sig Dispense Refill  . aspirin 81 MG EC tablet Take 81 mg by mouth daily.     . Blood Glucose Monitoring Suppl (RELION PRIME MONITOR) DEVI     . carvedilol (COREG) 3.125 MG tablet Take 1 tablet (3.125 mg total) by mouth 2 (two) times daily with a meal. 60 tablet 6  . furosemide (LASIX) 20 MG tablet Take 2 tablets (40 mg total) by mouth daily. 30  tablet 0  . lisinopril (PRINIVIL,ZESTRIL) 2.5 MG tablet Take 1 tablet (2.5 mg total) by mouth daily. 30 tablet 0  . metFORMIN (GLUCOPHAGE) 500 MG tablet Take 1 tablet (500 mg total) by mouth 2 (two) times daily with a meal. (Patient taking differently: Take 500 mg by mouth daily at 12 noon. ) 60 tablet 0  . RELION PRIME TEST test strip     . RELION ULTRA THIN PLUS LANCETS MISC     . sacubitril-valsartan (ENTRESTO) 24-26 MG Take 1 tablet by mouth 2 (two) times daily. 60 tablet 3  . simvastatin (ZOCOR) 40 MG tablet TAKE ONE TABLET BY MOUTH EVERY DAY (Patient taking differently: Take 40 mg by mouth daily at 12 noon. ) 15 tablet 0  . spironolactone (ALDACTONE) 25 MG tablet Take 25 mg by mouth daily at 12 noon.     . Tamsulosin HCl (FLOMAX) 0.4 MG CAPS TAKE ONE CAPSULE BY MOUTH EVERY DAY (Patient taking differently: Take 0.4 mg by mouth daily at 12 noon. ) 30 capsule 0   No current facility-administered medications for this encounter.     Allergies  Allergen Reactions  . Ibuprofen Nausea Only      Social History   Socioeconomic History  . Marital status: Widowed    Spouse name: Not on file  . Number of children: Not on file  . Years of education: Not on file  . Highest education level:  Not on file  Occupational History  . Not on file  Social Needs  . Financial resource strain: Not on file  . Food insecurity    Worry: Not on file    Inability: Not on file  . Transportation needs    Medical: Not on file    Non-medical: Not on file  Tobacco Use  . Smoking status: Former Smoker    Packs/day: 0.50    Types: Cigarettes    Quit date: 03/07/1993    Years since quitting: 26.6  . Smokeless tobacco: Never Used  Substance and Sexual Activity  . Alcohol use: No  . Drug use: No  . Sexual activity: Not on file  Lifestyle  . Physical activity    Days per week: Not on file    Minutes per session: Not on file  . Stress: Not on file  Relationships  . Social Herbalist on  phone: Not on file    Gets together: Not on file    Attends religious service: Not on file    Active member of club or organization: Not on file    Attends meetings of clubs or organizations: Not on file    Relationship status: Not on file  . Intimate partner violence    Fear of current or ex partner: Not on file    Emotionally abused: Not on file    Physically abused: Not on file    Forced sexual activity: Not on file  Other Topics Concern  . Not on file  Social History Narrative  . Not on file      Family History  Problem Relation Age of Onset  . Cancer Mother   . Heart disease Father     Vitals:   10/24/19 0917  BP: 112/70  Pulse: 86  SpO2: 99%  Weight: 106.1 kg (234 lb)   Wt Readings from Last 3 Encounters:  10/24/19 106.1 kg (234 lb)  08/30/19 118.4 kg (261 lb)  07/19/19 95.4 kg (210 lb 6.4 oz)    PHYSICAL EXAM: General: No resp difficulty HEENT: normal Neck: supple. JVP 11-12 . Carotids 2+ bilat; no bruits. No lymphadenopathy or thryomegaly appreciated. Cor: PMI nondisplaced. Regular rate & rhythm. No rubs, gallops or murmurs. Lungs: Crackles in the bases Abdomen: soft, nontender, nondistended. No hepatosplenomegaly. No bruits or masses. Good bowel sounds. Extremities: no cyanosis, clubbing, rash, R and LLE 2+ edema Neuro: alert & orientedx3, cranial nerves grossly intact. moves all 4 extremities w/o difficulty. Affect pleasant   ASSESSMENT & PLAN: 1. Chronic Systolic Heart Failure  - due to NICM echo 2011 EF 10-15%. Cath 2011 minimal CAD - Echo 2014 EF 25-30% - ECHO 06/2019 EF 25-30% mod MR. He does not have an ICD but he is not sure he is interested.  - NYHA III. Volume status elevated. Discussed limiting fluid intake < 2 liter per day.  - Increase lasix to 40 mg twice a day.  - Stop lisinopril because he is taking entresto.  - Continue entresto 24-26 mg twice a day.  - Continue carvedilol 3.125 bid - Continue spiro 25 - Check cMRI to exclude  infiltrative process and reassess EF. This was not completed. We will reorder today.  If EF , 35% consider ICD referral   2. HTN  -Stable today.   3. DM2 - consider SGLT2i  4. MR - moderate on last echo. Continue to follow   Referred to pharmacy for assistance with entresto. Asked him to bring all medications  to his appointment.   Follow up 2 weeks to reassess volume status. Check BMET and BNP today.  Greater than 50% of the (total minutes 25) visit spent in counseling/coordination of care regarding the above.      Tonye Becket, NP 10/24/19

## 2019-10-24 ENCOUNTER — Ambulatory Visit (HOSPITAL_COMMUNITY)
Admission: RE | Admit: 2019-10-24 | Discharge: 2019-10-24 | Disposition: A | Discharge status: Home / Self Care | Payer: Medicare HMO | Source: Ambulatory Visit | Attending: Adult Health | Admitting: Adult Health

## 2019-10-24 ENCOUNTER — Encounter (HOSPITAL_COMMUNITY): Payer: Self-pay

## 2019-10-24 ENCOUNTER — Other Ambulatory Visit (HOSPITAL_COMMUNITY): Payer: Self-pay

## 2019-10-24 ENCOUNTER — Other Ambulatory Visit: Payer: Self-pay

## 2019-10-24 VITALS — BP 112/70 | HR 86 | Wt 234.0 lb

## 2019-10-24 DIAGNOSIS — I2510 Atherosclerotic heart disease of native coronary artery without angina pectoris: Secondary | ICD-10-CM | POA: Diagnosis not present

## 2019-10-24 DIAGNOSIS — Z8249 Family history of ischemic heart disease and other diseases of the circulatory system: Secondary | ICD-10-CM | POA: Insufficient documentation

## 2019-10-24 DIAGNOSIS — I110 Hypertensive heart disease with heart failure: Secondary | ICD-10-CM | POA: Diagnosis present

## 2019-10-24 DIAGNOSIS — Z79899 Other long term (current) drug therapy: Secondary | ICD-10-CM | POA: Insufficient documentation

## 2019-10-24 DIAGNOSIS — E785 Hyperlipidemia, unspecified: Secondary | ICD-10-CM | POA: Insufficient documentation

## 2019-10-24 DIAGNOSIS — Z7982 Long term (current) use of aspirin: Secondary | ICD-10-CM | POA: Insufficient documentation

## 2019-10-24 DIAGNOSIS — I5022 Chronic systolic (congestive) heart failure: Secondary | ICD-10-CM

## 2019-10-24 DIAGNOSIS — I428 Other cardiomyopathies: Secondary | ICD-10-CM

## 2019-10-24 DIAGNOSIS — Z87891 Personal history of nicotine dependence: Secondary | ICD-10-CM | POA: Diagnosis not present

## 2019-10-24 DIAGNOSIS — Z809 Family history of malignant neoplasm, unspecified: Secondary | ICD-10-CM | POA: Insufficient documentation

## 2019-10-24 DIAGNOSIS — I10 Essential (primary) hypertension: Secondary | ICD-10-CM | POA: Diagnosis not present

## 2019-10-24 DIAGNOSIS — E119 Type 2 diabetes mellitus without complications: Secondary | ICD-10-CM | POA: Insufficient documentation

## 2019-10-24 DIAGNOSIS — Z886 Allergy status to analgesic agent status: Secondary | ICD-10-CM | POA: Insufficient documentation

## 2019-10-24 DIAGNOSIS — I340 Nonrheumatic mitral (valve) insufficiency: Secondary | ICD-10-CM | POA: Diagnosis not present

## 2019-10-24 DIAGNOSIS — Z7984 Long term (current) use of oral hypoglycemic drugs: Secondary | ICD-10-CM | POA: Insufficient documentation

## 2019-10-24 LAB — BASIC METABOLIC PANEL
Anion gap: 10 (ref 5–15)
BUN: 16 mg/dL (ref 8–23)
CO2: 25 mmol/L (ref 22–32)
Calcium: 8.8 mg/dL — ABNORMAL LOW (ref 8.9–10.3)
Chloride: 102 mmol/L (ref 98–111)
Creatinine, Ser: 1.01 mg/dL (ref 0.61–1.24)
GFR calc Af Amer: >60 mL/min (ref >60)
GFR calc non Af Amer: >60 mL/min (ref >60)
Glucose, Bld: 104 mg/dL — ABNORMAL HIGH (ref 70–99)
Potassium: 3.5 mmol/L (ref 3.5–5.1)
Sodium: 137 mmol/L (ref 135–145)

## 2019-10-24 LAB — BRAIN NATRIURETIC PEPTIDE: B Natriuretic Peptide: 1554.3 pg/mL — ABNORMAL HIGH (ref 0.0–100.0)

## 2019-10-24 MED ORDER — POTASSIUM CHLORIDE CRYS ER 20 MEQ PO TBCR: 20.0000 meq | EXTENDED_RELEASE_TABLET | Freq: Every day | ORAL | 3 refills | Status: DC

## 2019-10-24 MED ORDER — FUROSEMIDE 40 MG PO TABS: 40.0000 mg | ORAL_TABLET | Freq: Every day | ORAL | 6 refills | Status: DC

## 2019-10-24 NOTE — Patient Instructions (Signed)
STOP Lisinopril  INCREASE Lasix 40mg  (1 tab) twice a day  START Potassium 20 meq (1 tab) daily  Labs today We will only contact you if something comes back abnormal or we need to make some changes. Otherwise no news is good news!  Your physician recommends that you schedule a follow-up appointment in: 2 weeks with the Nurse Practitioner  At the [facility], you and your health needs are our priority. As part of our continuing mission to provide you with exceptional heart care, we have created designated Provider Care Teams. These Care Teams include your primary Cardiologist (physician) and Advanced Practice Providers (APPs- Physician Assistants and Nurse Practitioners) who all work together to provide you with the care you need, when you need it.   You may see any of the following providers on your designated Care Team at your next follow up: Marland Kitchen Dr Glori Bickers . Dr Loralie Champagne . Darrick Grinder, NP . [name], PA   Please be sure to bring in all your medications bottles to every appointment.

## 2019-10-31 ENCOUNTER — Telehealth (HOSPITAL_COMMUNITY): Payer: Self-pay | Admitting: *Deleted

## 2019-10-31 NOTE — Telephone Encounter (Signed)
Pt's PCP, Dr Aris Lot called requesting records from July be faxed to her at [phone], call records from our office and hospital from July faxed to her

## 2019-11-02 ENCOUNTER — Telehealth: Payer: Self-pay | Admitting: Nurse Practitioner

## 2019-11-02 ENCOUNTER — Telehealth (HOSPITAL_COMMUNITY): Payer: Self-pay

## 2019-11-02 NOTE — Telephone Encounter (Signed)
Received call from [facility] that there is a duplicate order for  Cardiac MRI and authorization cannot be provided as it does not meet criteria if test already done.  Confirmed test was not done and needs to be scheduled. Per [facility], new authorization provided #[id number].  Exp 12/11. Jasmine CMA made aware and will contact schedulers to make appt for patient.

## 2019-11-02 NOTE — Telephone Encounter (Signed)
Called patient to schedule Palliative Consult, no answer and unable to leave message, no voicemail.    I then called daughter [name] and spoke with her regarding Palliative services and all questions answered and she was in agreement with this.  I have scheduled a Zoom Palliative Consult for 11/15/19 @ 12 Noon.

## 2019-11-07 ENCOUNTER — Other Ambulatory Visit: Payer: Self-pay

## 2019-11-07 ENCOUNTER — Telehealth: Payer: Self-pay | Admitting: Nurse Practitioner

## 2019-11-07 ENCOUNTER — Ambulatory Visit (HOSPITAL_COMMUNITY)
Admission: RE | Admit: 2019-11-07 | Discharge: 2019-11-07 | Disposition: A | Discharge status: Home / Self Care | Payer: Medicare HMO | Source: Ambulatory Visit | Attending: Adult Health | Admitting: Adult Health

## 2019-11-07 ENCOUNTER — Encounter (HOSPITAL_COMMUNITY): Payer: Self-pay

## 2019-11-07 VITALS — BP 98/60 | HR 82 | Wt 216.8 lb

## 2019-11-07 DIAGNOSIS — Z7984 Long term (current) use of oral hypoglycemic drugs: Secondary | ICD-10-CM | POA: Diagnosis not present

## 2019-11-07 DIAGNOSIS — Z7982 Long term (current) use of aspirin: Secondary | ICD-10-CM | POA: Diagnosis not present

## 2019-11-07 DIAGNOSIS — I2510 Atherosclerotic heart disease of native coronary artery without angina pectoris: Secondary | ICD-10-CM | POA: Diagnosis not present

## 2019-11-07 DIAGNOSIS — I10 Essential (primary) hypertension: Secondary | ICD-10-CM

## 2019-11-07 DIAGNOSIS — E119 Type 2 diabetes mellitus without complications: Secondary | ICD-10-CM | POA: Diagnosis not present

## 2019-11-07 DIAGNOSIS — Z79899 Other long term (current) drug therapy: Secondary | ICD-10-CM | POA: Insufficient documentation

## 2019-11-07 DIAGNOSIS — Z87891 Personal history of nicotine dependence: Secondary | ICD-10-CM | POA: Diagnosis not present

## 2019-11-07 DIAGNOSIS — Z8249 Family history of ischemic heart disease and other diseases of the circulatory system: Secondary | ICD-10-CM | POA: Diagnosis not present

## 2019-11-07 DIAGNOSIS — I5022 Chronic systolic (congestive) heart failure: Secondary | ICD-10-CM | POA: Insufficient documentation

## 2019-11-07 DIAGNOSIS — R0600 Dyspnea, unspecified: Secondary | ICD-10-CM | POA: Insufficient documentation

## 2019-11-07 DIAGNOSIS — I110 Hypertensive heart disease with heart failure: Secondary | ICD-10-CM | POA: Diagnosis not present

## 2019-11-07 DIAGNOSIS — E785 Hyperlipidemia, unspecified: Secondary | ICD-10-CM | POA: Diagnosis not present

## 2019-11-07 DIAGNOSIS — I428 Other cardiomyopathies: Secondary | ICD-10-CM | POA: Diagnosis not present

## 2019-11-07 LAB — BASIC METABOLIC PANEL
Anion gap: 15 (ref 5–15)
BUN: 14 mg/dL (ref 8–23)
CO2: 26 mmol/L (ref 22–32)
Calcium: 9.3 mg/dL (ref 8.9–10.3)
Chloride: 96 mmol/L — ABNORMAL LOW (ref 98–111)
Creatinine, Ser: 1.25 mg/dL — ABNORMAL HIGH (ref 0.61–1.24)
GFR calc Af Amer: >60 mL/min (ref >60)
GFR calc non Af Amer: 55 mL/min — ABNORMAL LOW (ref >60)
Glucose, Bld: 94 mg/dL (ref 70–99)
Potassium: 4.2 mmol/L (ref 3.5–5.1)
Sodium: 137 mmol/L (ref 135–145)

## 2019-11-07 LAB — BRAIN NATRIURETIC PEPTIDE: B Natriuretic Peptide: 1266.2 pg/mL — ABNORMAL HIGH (ref 0.0–100.0)

## 2019-11-07 MED ORDER — FUROSEMIDE 40 MG PO TABS: ORAL_TABLET | ORAL | 5 refills | Status: DC

## 2019-11-07 MED ORDER — POTASSIUM CHLORIDE CRYS ER 20 MEQ PO TBCR: 20.0000 meq | EXTENDED_RELEASE_TABLET | Freq: Two times a day (BID) | ORAL | 5 refills | Status: DC

## 2019-11-07 MED ORDER — POTASSIUM CHLORIDE CRYS ER 20 MEQ PO TBCR: 40.0000 meq | EXTENDED_RELEASE_TABLET | Freq: Every day | ORAL | 5 refills | Status: DC

## 2019-11-07 NOTE — Progress Notes (Addendum)
Advanced Heart Failure Clinic Note   PCP: Marletta Lor PCP-Cardiologist: Dr Gala Romney  HPI: Trevor Rodriguez is a 78 y.o. 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 RV systolic function severely reduced. PA pk pressure 49 mmHg.  Cardiac cath with minimal irregularities.  Echo 2014 with EF 25-30%  LV moderately dilated, trivial AR, mild to mod MR, RA mildly dilated, PA pk pressure 39 mmHg   Had been lost to f/u since 2014 and admitted to [facility] in 06/26/19 with CP and HF. Trop negative, An ECHO was completed and showed EF 25-30% moderate MR which was similar to previous ECHO in 2014. Diuresed with  IV lasix and transitioned to lasix 40 mg daily.HF medications adjusted. Discharged 06/27/19 with D/c weight 217 pounds.   Today he returns for HF follow up. Last visit lasix was increased to 40 mg twice a day. Overall feeling much better. Mild dyspnea with steps. Denies PND/Orthopnea.Leg edema getting better.  Appetite ok. No fever or chills. Weight at home has been trending down 224-->212 pounds.   Taking all medications.     Past Medical History:  Diagnosis Date  . Congestive heart failure (HCC)    Ejection fraction around 15%. He was recently horpitalized for heart failure and had a heart catheterization. He had minor luminal irregularities.  . Hyperlipidemia   . Hypertension     Current Outpatient Medications  Medication Sig Dispense Refill  . aspirin 81 MG EC tablet Take 81 mg by mouth daily.     . Blood Glucose Monitoring Suppl (RELION PRIME MONITOR) DEVI     . carvedilol (COREG) 3.125 MG tablet Take 1 tablet (3.125 mg total) by mouth 2 (two) times daily with a meal. 60 tablet 6  . furosemide (LASIX) 40 MG tablet Take 40 mg by mouth 2 (two) times daily.    . metFORMIN (GLUCOPHAGE) 500 MG tablet Take 1 tablet (500 mg total) by mouth 2 (two)  times daily with a meal. (Patient taking differently: Take 500 mg by mouth daily at 12 noon. ) 60 tablet 0  . potassium chloride SA (KLOR-CON) 20 MEQ tablet Take 1 tablet (20 mEq total) by mouth daily. 90 tablet 3  . RELION PRIME TEST test strip     . RELION ULTRA THIN PLUS LANCETS MISC     . sacubitril-valsartan (ENTRESTO) 24-26 MG Take 1 tablet by mouth 2 (two) times daily. 60 tablet 3  . spironolactone (ALDACTONE) 25 MG tablet Take 25 mg by mouth daily at 12 noon.     . Tamsulosin HCl (FLOMAX) 0.4 MG CAPS TAKE ONE CAPSULE BY MOUTH EVERY DAY (Patient taking differently: Take 0.4 mg by mouth daily at 12 noon. ) 30 capsule 0   No current facility-administered medications for this encounter.     Allergies  Allergen Reactions  . Ibuprofen Nausea Only      Social History   Socioeconomic History  . Marital status: Widowed    Spouse name: Not on file  . Number of children: Not on file  . Years of education: Not on file  . Highest education level: Not on file  Occupational History  . Not on file  Social Needs  . Financial resource strain: Not on file  . Food insecurity    Worry: Not on file    Inability: Not on file  .  Transportation needs    Medical: Not on file    Non-medical: Not on file  Tobacco Use  . Smoking status: Former Smoker    Packs/day: 0.50    Types: Cigarettes    Quit date: 03/07/1993    Years since quitting: 26.6  . Smokeless tobacco: Never Used  Substance and Sexual Activity  . Alcohol use: No  . Drug use: No  . Sexual activity: Not on file  Lifestyle  . Physical activity    Days per week: Not on file    Minutes per session: Not on file  . Stress: Not on file  Relationships  . Social Herbalist on phone: Not on file    Gets together: Not on file    Attends religious service: Not on file    Active member of club or organization: Not on file    Attends meetings of clubs or organizations: Not on file    Relationship status: Not on file  .  Intimate partner violence    Fear of current or ex partner: Not on file    Emotionally abused: Not on file    Physically abused: Not on file    Forced sexual activity: Not on file  Other Topics Concern  . Not on file  Social History Narrative  . Not on file      Family History  Problem Relation Age of Onset  . Cancer Mother   . Heart disease Father     Vitals:   11/07/19 0948  BP: 98/60  Pulse: 82  SpO2: 98%  Weight: 98.3 kg (216 lb 12.8 oz)   Wt Readings from Last 3 Encounters:  11/07/19 98.3 kg (216 lb 12.8 oz)  10/24/19 106.1 kg (234 lb)  08/30/19 118.4 kg (261 lb)    PHYSICAL EXAM: General:  Well appearing. No resp difficulty HEENT: normal Neck: supple. JVP ~10. Carotids 2+ bilat; no bruits. No lymphadenopathy or thryomegaly appreciated. Cor: PMI nondisplaced. Regular rate & rhythm. No rubs, gallops or murmurs. Lungs: clear Abdomen: soft, nontender, nondistended. No hepatosplenomegaly. No bruits or masses. Good bowel sounds. Extremities: no cyanosis, clubbing, rash, R and LLE 1-2+edema Neuro: alert & orientedx3, cranial nerves grossly intact. moves all 4 extremities w/o difficulty. Affect pleasant    ASSESSMENT & PLAN: 1. Chronic Systolic Heart Failure  - due to NICM echo 2011 EF 10-15%. Cath 2011 minimal CAD - Echo 2014 EF 25-30% - ECHO 06/2019 EF 25-30% mod MR. He does not have an ICD but he is not sure he is interested.  - NYHA III. Volume status improving but still overloaded. Increase morning lasix to 80 mg and continue afternoon to 40 mg.  - Check BMET today and in 2 weeks.  - Continue entresto 24-26 mg twice a day.  - Continue carvedilol 3.125 bid - Continue spiro 25 mg  - Check cMRI to exclude infiltrative process and reassess EF. This was not completed. Still not completed?   If EF , 35% consider ICD referral   2. HTN  - Stable  3. DM2 - On metformin.  - consider SGLT2i next visit.    4. MR - moderate on last echo. Continue to follow    Follow up in 2 weeks to reassess volume status.    Darrick Grinder, NP 11/07/19

## 2019-11-07 NOTE — Telephone Encounter (Signed)
Called daughter [name], to see if we could reschedule the Palliative Consult that was scheduled for 11/15/19, left message with reason for call along with my contact information.  2:50 PM:  Rec'd a call back from [place] and she said she would need to look at her scheduled and call me back tomorrow to reschedule the Consult.

## 2019-11-07 NOTE — Patient Instructions (Addendum)
INCREASE: Take Potassium 40 meq (2 tabs) daily  INCREASE Lasix (Furosemide) to 80mg  (2 tabs) in the morning AND continue taking Lasix 40mg  (1 tab) in the evening.  Labs today  We will only contact you if something comes back abnormal or we need to make some changes. Otherwise no news is good news!   Your physician recommends that you schedule a follow-up appointment in: Monday, November 30th, 2020 at 10:30am. Parking code [id number]  At the [facility], you and your health needs are our priority. As part of our continuing mission to provide you with exceptional heart care, we have created designated Provider Care Teams. These Care Teams include your primary Cardiologist (physician) and Advanced Practice Providers (APPs- Physician Assistants and Nurse Practitioners) who all work together to provide you with the care you need, when you need it.   You may see any of the following providers on your designated Care Team at your next follow up: Marland Kitchen Dr Glori Bickers . Dr Loralie Champagne . Darrick Grinder, NP . [name], PA   Please be sure to bring in all your medications bottles to every appointment.

## 2019-11-09 ENCOUNTER — Telehealth: Payer: Self-pay | Admitting: Nurse Practitioner

## 2019-11-09 NOTE — Telephone Encounter (Signed)
Called daughter back to see if we could reschedule the Initial Palliative Consult to 11/16/19 and this did not work for her.  We have rescheduled the Consult for 12/06/19 @ 2 PM

## 2019-11-15 ENCOUNTER — Other Ambulatory Visit: Payer: Self-pay | Admitting: Nurse Practitioner

## 2019-11-21 ENCOUNTER — Encounter (HOSPITAL_COMMUNITY): Payer: Self-pay

## 2019-11-21 ENCOUNTER — Ambulatory Visit (HOSPITAL_COMMUNITY)
Admission: RE | Admit: 2019-11-21 | Discharge: 2019-11-21 | Disposition: A | Discharge status: Home / Self Care | Payer: Medicare HMO | Source: Ambulatory Visit | Attending: Adult Health | Admitting: Adult Health

## 2019-11-21 ENCOUNTER — Other Ambulatory Visit: Payer: Self-pay

## 2019-11-21 VITALS — BP 88/53 | HR 74 | Wt 190.8 lb

## 2019-11-21 DIAGNOSIS — I428 Other cardiomyopathies: Secondary | ICD-10-CM | POA: Diagnosis not present

## 2019-11-21 DIAGNOSIS — Z7982 Long term (current) use of aspirin: Secondary | ICD-10-CM | POA: Diagnosis not present

## 2019-11-21 DIAGNOSIS — Z8249 Family history of ischemic heart disease and other diseases of the circulatory system: Secondary | ICD-10-CM | POA: Diagnosis not present

## 2019-11-21 DIAGNOSIS — I5022 Chronic systolic (congestive) heart failure: Secondary | ICD-10-CM | POA: Diagnosis present

## 2019-11-21 DIAGNOSIS — Z7984 Long term (current) use of oral hypoglycemic drugs: Secondary | ICD-10-CM | POA: Insufficient documentation

## 2019-11-21 DIAGNOSIS — E119 Type 2 diabetes mellitus without complications: Secondary | ICD-10-CM | POA: Diagnosis not present

## 2019-11-21 DIAGNOSIS — E861 Hypovolemia: Secondary | ICD-10-CM

## 2019-11-21 DIAGNOSIS — I9589 Other hypotension: Secondary | ICD-10-CM

## 2019-11-21 DIAGNOSIS — E785 Hyperlipidemia, unspecified: Secondary | ICD-10-CM | POA: Insufficient documentation

## 2019-11-21 DIAGNOSIS — Z79899 Other long term (current) drug therapy: Secondary | ICD-10-CM | POA: Diagnosis not present

## 2019-11-21 DIAGNOSIS — I110 Hypertensive heart disease with heart failure: Secondary | ICD-10-CM | POA: Diagnosis not present

## 2019-11-21 DIAGNOSIS — I10 Essential (primary) hypertension: Secondary | ICD-10-CM | POA: Diagnosis not present

## 2019-11-21 DIAGNOSIS — I2510 Atherosclerotic heart disease of native coronary artery without angina pectoris: Secondary | ICD-10-CM | POA: Diagnosis not present

## 2019-11-21 DIAGNOSIS — R42 Dizziness and giddiness: Secondary | ICD-10-CM | POA: Diagnosis not present

## 2019-11-21 DIAGNOSIS — Z87891 Personal history of nicotine dependence: Secondary | ICD-10-CM | POA: Diagnosis not present

## 2019-11-21 DIAGNOSIS — Z886 Allergy status to analgesic agent status: Secondary | ICD-10-CM | POA: Insufficient documentation

## 2019-11-21 LAB — BASIC METABOLIC PANEL
Anion gap: 11 (ref 5–15)
BUN: 58 mg/dL — ABNORMAL HIGH (ref 8–23)
CO2: 25 mmol/L (ref 22–32)
Calcium: 9.5 mg/dL (ref 8.9–10.3)
Chloride: 95 mmol/L — ABNORMAL LOW (ref 98–111)
Creatinine, Ser: 1.82 mg/dL — ABNORMAL HIGH (ref 0.61–1.24)
GFR calc Af Amer: 40 mL/min — ABNORMAL LOW (ref >60)
GFR calc non Af Amer: 35 mL/min — ABNORMAL LOW (ref >60)
Glucose, Bld: 101 mg/dL — ABNORMAL HIGH (ref 70–99)
Potassium: 5.7 mmol/L — ABNORMAL HIGH (ref 3.5–5.1)
Sodium: 131 mmol/L — ABNORMAL LOW (ref 135–145)

## 2019-11-21 MED ORDER — FUROSEMIDE 40 MG PO TABS: 80.0000 mg | ORAL_TABLET | Freq: Every day | ORAL | 5 refills | Status: DC

## 2019-11-21 MED ORDER — POTASSIUM CHLORIDE CRYS ER 20 MEQ PO TBCR: 20.0000 meq | EXTENDED_RELEASE_TABLET | Freq: Every day | ORAL | 5 refills | Status: DC

## 2019-11-21 NOTE — Progress Notes (Signed)
Advanced Heart Failure Clinic Note   PCP: Marletta Lor PCP-Cardiologist: Dr Gala Romney  HPI: Trevor Rodriguez is a 78 y.o. 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 RV systolic function severely reduced. PA pk pressure 49 mmHg.  Cardiac cath with minimal irregularities.  Echo 2014 with EF 25-30%  LV moderately dilated, trivial AR, mild to mod MR, RA mildly dilated, PA pk pressure 39 mmHg   Had been lost to f/u since 2014 and admitted to [facility] in 06/26/19 with CP and HF. Trop negative, An ECHO was completed and showed EF 25-30% moderate MR which was similar to previous ECHO in 2014. Diuresed with  IV lasix and transitioned to lasix 40 mg daily.HF medications adjusted. Discharged 06/27/19 with D/c weight 217 pounds.   Today he returns for HF follow up. Last visit lasix was increased to 80 mg/40 mg. Weight has gone down 11 pounds. Complaining dizziness when standing. Mild dyspnea with exertion. Denies PND/Orthopnea. Appetite poor.  ok. No fever or chills. Weight at home has gone down from 201--->190   pounds. Taking all medications.   Past Medical History:  Diagnosis Date  . Congestive heart failure (HCC)    Ejection fraction around 15%. He was recently horpitalized for heart failure and had a heart catheterization. He had minor luminal irregularities.  . Hyperlipidemia   . Hypertension     Current Outpatient Medications  Medication Sig Dispense Refill  . aspirin 81 MG EC tablet Take 81 mg by mouth daily.     . Blood Glucose Monitoring Suppl (RELION PRIME MONITOR) DEVI     . carvedilol (COREG) 3.125 MG tablet Take 1 tablet (3.125 mg total) by mouth 2 (two) times daily with a meal. 60 tablet 6  . furosemide (LASIX) 40 MG tablet Take 2 tablets (80 mg total) by mouth every morning AND 1 tablet (40 mg total) every evening. 90 tablet 5  . metFORMIN  (GLUCOPHAGE) 500 MG tablet Take 1 tablet (500 mg total) by mouth 2 (two) times daily with a meal. (Patient taking differently: Take 500 mg by mouth daily at 12 noon. ) 60 tablet 0  . potassium chloride SA (KLOR-CON) 20 MEQ tablet Take 2 tablets (40 mEq total) by mouth daily. 60 tablet 5  . RELION PRIME TEST test strip     . RELION ULTRA THIN PLUS LANCETS MISC     . sacubitril-valsartan (ENTRESTO) 24-26 MG Take 1 tablet by mouth 2 (two) times daily. 60 tablet 3  . simvastatin (ZOCOR) 40 MG tablet Take 40 mg by mouth daily.    Marland Kitchen spironolactone (ALDACTONE) 25 MG tablet Take 25 mg by mouth daily at 12 noon.     . Tamsulosin HCl (FLOMAX) 0.4 MG CAPS TAKE ONE CAPSULE BY MOUTH EVERY DAY (Patient taking differently: Take 0.4 mg by mouth daily at 12 noon. ) 30 capsule 0   No current facility-administered medications for this encounter.     Allergies  Allergen Reactions  . Ibuprofen Nausea Only      Social History   Socioeconomic History  . Marital status: Widowed    Spouse name: Not on file  . Number of children: Not on file  . Years of education: Not on file  . Highest education level: Not on file  Occupational History  . Not on file  Social Needs  .  Financial resource strain: Not on file  . Food insecurity    Worry: Not on file    Inability: Not on file  . Transportation needs    Medical: Not on file    Non-medical: Not on file  Tobacco Use  . Smoking status: Former Smoker    Packs/day: 0.50    Types: Cigarettes    Quit date: 03/07/1993    Years since quitting: 26.7  . Smokeless tobacco: Never Used  Substance and Sexual Activity  . Alcohol use: No  . Drug use: No  . Sexual activity: Not on file  Lifestyle  . Physical activity    Days per week: Not on file    Minutes per session: Not on file  . Stress: Not on file  Relationships  . Social Herbalist on phone: Not on file    Gets together: Not on file    Attends religious service: Not on file    Active member  of club or organization: Not on file    Attends meetings of clubs or organizations: Not on file    Relationship status: Not on file  . Intimate partner violence    Fear of current or ex partner: Not on file    Emotionally abused: Not on file    Physically abused: Not on file    Forced sexual activity: Not on file  Other Topics Concern  . Not on file  Social History Narrative  . Not on file      Family History  Problem Relation Age of Onset  . Cancer Mother   . Heart disease Father     Vitals:   11/21/19 1046  BP: (!) 88/53  Pulse: 74  SpO2: 98%  Weight: 86.5 kg (190 lb 12.8 oz)   Wt Readings from Last 3 Encounters:  11/21/19 86.5 kg (190 lb 12.8 oz)  11/07/19 98.3 kg (216 lb 12.8 oz)  10/24/19 106.1 kg (234 lb)    PHYSICAL EXAM: General:  Well appearing. No resp difficulty HEENT: normal Neck: supple. no JVD. Carotids 2+ bilat; no bruits. No lymphadenopathy or thryomegaly appreciated. Cor: PMI nondisplaced. Regular rate & rhythm. No rubs, gallops or murmurs. Lungs: clear Abdomen: soft, nontender, nondistended. No hepatosplenomegaly. No bruits or masses. Good bowel sounds. Extremities: no cyanosis, clubbing, rash, edema Neuro: alert & orientedx3, cranial nerves grossly intact. moves all 4 extremities w/o difficulty. Affect pleasant    ASSESSMENT & PLAN: 1. Chronic Systolic Heart Failure  - due to NICM echo 2011 EF 10-15%. Cath 2011 minimal CAD - Echo 2014 EF 25-30% - ECHO 06/2019 EF 25-30% mod MR. He does not have an ICD but he is not sure he is interested.  - NYHA III.Weight down 11 pounds after increasing lasix. Having some dizziness.  - Hold lasix and potassium for 2 days. Check BMET today. Appears dry. After hold lasix and potassium he will then need to start lasix 80 mg daily + 20 meq potassium.   - Continue entresto 24-26 mg twice a day.  - Continue carvedilol 3.125 bid - Continue spiro 25 mg  - Check cMRI to exclude infiltrative process and reassess EF.  This was not completed. Still not completed?     2. HTN  -Low today.   3. DM2 - On metformin.  - consider SGLT2i soon.   4. MR - moderate on last echo. Continue to follow   Appears dry today. Hold lasix and potassium today.  Check BMET    Ninfa Meeker,  NP 11/21/19

## 2019-11-21 NOTE — Patient Instructions (Signed)
HOLD Lasix and Potassium for 2 days then RESTART Lasix 80 mg daily RESTART Potassium 20 MEQ daily  Labs today We will only contact you if something comes back abnormal or we need to make some changes. Otherwise no news is good news!  Your physician recommends that you schedule a follow-up appointment in: 2 weeks with  in the Advanced Practitioners (PA/NP) Clinic    Do the following things EVERYDAY: 1) Weigh yourself in the morning before breakfast. Write it down and keep it in a log. 2) Take your medicines as prescribed 3) Eat low salt foods-Limit salt (sodium) to 2000 mg per day.  4) Stay as active as you can everyday 5) Limit all fluids for the day to less than 2 liters  At the [facility], you and your health needs are our priority. As part of our continuing mission to provide you with exceptional heart care, we have created designated Provider Care Teams. These Care Teams include your primary Cardiologist (physician) and Advanced Practice Providers (APPs- Physician Assistants and Nurse Practitioners) who all work together to provide you with the care you need, when you need it.   You may see any of the following providers on your designated Care Team at your next follow up: Marland Kitchen Dr Glori Bickers . Dr Loralie Champagne . Darrick Grinder, NP . [name], PA   Please be sure to bring in all your medications bottles to every appointment.

## 2019-11-22 ENCOUNTER — Telehealth (HOSPITAL_COMMUNITY): Payer: Self-pay | Admitting: Cardiology

## 2019-11-22 DIAGNOSIS — I5022 Chronic systolic (congestive) heart failure: Secondary | ICD-10-CM

## 2019-11-22 NOTE — Telephone Encounter (Signed)
Notes recorded by Kerry Dory, CMA on 11/22/2019 at 5:01 PM EST  Patient aware.    ------   Notes recorded by Kerry Dory, CMA on 11/21/2019 at 2:22 PM EST  Unable to reach patient. No answer, line busy x 2 [phone] (H)   ------   Notes recorded by [place], NP on 11/21/2019 at 12:48 PM EST  Please call. Potassium and lasix held for 2 days at his appointment. Also stop spironolactone. Needs BMET Friday.

## 2019-11-25 ENCOUNTER — Other Ambulatory Visit (HOSPITAL_COMMUNITY): Payer: Self-pay | Admitting: Adult Health

## 2019-11-25 ENCOUNTER — Ambulatory Visit (HOSPITAL_COMMUNITY)
Admission: RE | Admit: 2019-11-25 | Discharge: 2019-11-25 | Disposition: A | Discharge status: Home / Self Care | Payer: Medicare HMO | Source: Ambulatory Visit | Attending: Internal Medicine | Admitting: Internal Medicine

## 2019-11-25 ENCOUNTER — Other Ambulatory Visit: Payer: Self-pay

## 2019-11-25 ENCOUNTER — Telehealth (HOSPITAL_COMMUNITY): Payer: Self-pay | Admitting: Emergency Medicine

## 2019-11-25 DIAGNOSIS — I5022 Chronic systolic (congestive) heart failure: Secondary | ICD-10-CM | POA: Diagnosis present

## 2019-11-25 LAB — BASIC METABOLIC PANEL
Anion gap: 11 (ref 5–15)
BUN: 34 mg/dL — ABNORMAL HIGH (ref 8–23)
CO2: 23 mmol/L (ref 22–32)
Calcium: 9.4 mg/dL (ref 8.9–10.3)
Chloride: 101 mmol/L (ref 98–111)
Creatinine, Ser: 1.33 mg/dL — ABNORMAL HIGH (ref 0.61–1.24)
GFR calc Af Amer: 59 mL/min — ABNORMAL LOW (ref >60)
GFR calc non Af Amer: 51 mL/min — ABNORMAL LOW (ref >60)
Glucose, Bld: 109 mg/dL — ABNORMAL HIGH (ref 70–99)
Potassium: 5.4 mmol/L — ABNORMAL HIGH (ref 3.5–5.1)
Sodium: 135 mmol/L (ref 135–145)

## 2019-11-25 NOTE — Telephone Encounter (Signed)
Line rang for 4 mins. No answering service to leave message

## 2019-11-28 ENCOUNTER — Encounter (HOSPITAL_COMMUNITY): Payer: Self-pay | Admitting: Internal Medicine

## 2019-11-28 ENCOUNTER — Ambulatory Visit (HOSPITAL_COMMUNITY)
Admission: RE | Admit: 2019-11-28 | Discharge: 2019-11-28 | Disposition: A | Discharge status: Home / Self Care | Payer: Medicare HMO | Source: Ambulatory Visit | Attending: Internal Medicine | Admitting: Internal Medicine

## 2019-11-28 ENCOUNTER — Other Ambulatory Visit: Payer: Self-pay

## 2019-11-28 VITALS — BP 80/50 | HR 76 | Wt 192.2 lb

## 2019-11-28 DIAGNOSIS — E119 Type 2 diabetes mellitus without complications: Secondary | ICD-10-CM | POA: Insufficient documentation

## 2019-11-28 DIAGNOSIS — E861 Hypovolemia: Secondary | ICD-10-CM | POA: Diagnosis not present

## 2019-11-28 DIAGNOSIS — N281 Cyst of kidney, acquired: Secondary | ICD-10-CM | POA: Insufficient documentation

## 2019-11-28 DIAGNOSIS — Z7982 Long term (current) use of aspirin: Secondary | ICD-10-CM | POA: Diagnosis not present

## 2019-11-28 DIAGNOSIS — Z886 Allergy status to analgesic agent status: Secondary | ICD-10-CM | POA: Insufficient documentation

## 2019-11-28 DIAGNOSIS — Z8249 Family history of ischemic heart disease and other diseases of the circulatory system: Secondary | ICD-10-CM | POA: Insufficient documentation

## 2019-11-28 DIAGNOSIS — I5022 Chronic systolic (congestive) heart failure: Secondary | ICD-10-CM

## 2019-11-28 DIAGNOSIS — Z87891 Personal history of nicotine dependence: Secondary | ICD-10-CM | POA: Diagnosis not present

## 2019-11-28 DIAGNOSIS — E785 Hyperlipidemia, unspecified: Secondary | ICD-10-CM | POA: Diagnosis not present

## 2019-11-28 DIAGNOSIS — Z7984 Long term (current) use of oral hypoglycemic drugs: Secondary | ICD-10-CM | POA: Insufficient documentation

## 2019-11-28 DIAGNOSIS — I9589 Other hypotension: Secondary | ICD-10-CM | POA: Diagnosis not present

## 2019-11-28 DIAGNOSIS — Z79899 Other long term (current) drug therapy: Secondary | ICD-10-CM | POA: Insufficient documentation

## 2019-11-28 DIAGNOSIS — I428 Other cardiomyopathies: Secondary | ICD-10-CM | POA: Diagnosis not present

## 2019-11-28 DIAGNOSIS — I340 Nonrheumatic mitral (valve) insufficiency: Secondary | ICD-10-CM | POA: Insufficient documentation

## 2019-11-28 DIAGNOSIS — I110 Hypertensive heart disease with heart failure: Secondary | ICD-10-CM | POA: Insufficient documentation

## 2019-11-28 LAB — BASIC METABOLIC PANEL
Anion gap: 14 (ref 5–15)
BUN: 50 mg/dL — ABNORMAL HIGH (ref 8–23)
CO2: 19 mmol/L — ABNORMAL LOW (ref 22–32)
Calcium: 9.7 mg/dL (ref 8.9–10.3)
Chloride: 100 mmol/L (ref 98–111)
Creatinine, Ser: 1.61 mg/dL — ABNORMAL HIGH (ref 0.61–1.24)
GFR calc Af Amer: 47 mL/min — ABNORMAL LOW (ref >60)
GFR calc non Af Amer: 40 mL/min — ABNORMAL LOW (ref >60)
Glucose, Bld: 93 mg/dL (ref 70–99)
Potassium: 5.4 mmol/L — ABNORMAL HIGH (ref 3.5–5.1)
Sodium: 133 mmol/L — ABNORMAL LOW (ref 135–145)

## 2019-11-28 LAB — BRAIN NATRIURETIC PEPTIDE: B Natriuretic Peptide: 295.4 pg/mL — ABNORMAL HIGH (ref 0.0–100.0)

## 2019-11-28 MED ORDER — FUROSEMIDE 40 MG PO TABS: 40.0000 mg | ORAL_TABLET | Freq: Every day | ORAL | 5 refills | Status: DC | PRN

## 2019-11-28 MED ORDER — GADOBUTROL 1 MMOL/ML IV SOLN
10.0000 mL | Freq: Once | INTRAVENOUS | Status: AC | PRN
  Administered 2019-11-28: 10:00:00 10 mL via INTRAVENOUS

## 2019-11-28 NOTE — Patient Instructions (Addendum)
Take Lasix only as needed for fluid.  Routine lab work today. Will notify you of abnormal results  Check blood pressure in [facility] Thursday and call us with reading.  Follow up in 1 month.

## 2019-11-28 NOTE — Progress Notes (Signed)
Advanced Heart Failure Clinic Note   PCP: Marletta Lor PCP-Cardiologist: Dr Gala Romney   HPI:  Trevor Rodriguez is a 78 y.o. 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 RV systolic function severely reduced. PA pk pressure 49 mmHg.  Cardiac cath with minimal irregularities.  Echo 2014 with EF 25-30%  LV moderately dilated, trivial AR, mild to mod MR, RA mildly dilated, PA pk pressure 39 mmHg   Had been lost to f/u since 2014 and admitted to [facility] in 06/26/19 with CP and HF. Trop negative, An ECHO was completed and showed EF 25-30% moderate MR which was similar to previous ECHO in 2014. Diuresed with  IV lasix and transitioned to lasix 40 mg daily.HF medications adjusted. Discharged 06/27/19 with D/c weight 217 pounds.   Today he returns for HF follow up. Seen recently and BP was low. Felt to be volume depleted. Lasix decreased from 80/40 to 80 daily. Says he can do most things he wants to do but doesn't push himself. Denies SOB, CP or presyncope. No edema, orthopnea or PND. Had cMRI today. Results pending.     Past Medical History:  Diagnosis Date  . Congestive heart failure (HCC)    Ejection fraction around 15%. He was recently horpitalized for heart failure and had a heart catheterization. He had minor luminal irregularities.  . Hyperlipidemia   . Hypertension     Current Outpatient Medications  Medication Sig Dispense Refill  . aspirin 81 MG EC tablet Take 81 mg by mouth daily.     . Blood Glucose Monitoring Suppl (RELION PRIME MONITOR) DEVI     . carvedilol (COREG) 3.125 MG tablet Take 1 tablet (3.125 mg total) by mouth 2 (two) times daily with a meal. 60 tablet 6  . furosemide (LASIX) 40 MG tablet Take 2 tablets (80 mg total) by mouth daily. 60 tablet 5  . metFORMIN (GLUCOPHAGE) 500 MG tablet Take 1 tablet (500 mg total) by mouth 2  (two) times daily with a meal. 60 tablet 0  . QUEtiapine (SEROQUEL) 25 MG tablet Take 25 mg by mouth daily as needed.     Marland Kitchen RELION PRIME TEST test strip     . RELION ULTRA THIN PLUS LANCETS MISC     . sacubitril-valsartan (ENTRESTO) 24-26 MG Take 1 tablet by mouth 2 (two) times daily. 60 tablet 3  . simvastatin (ZOCOR) 40 MG tablet Take 40 mg by mouth daily.    . tamsulosin (FLOMAX) 0.4 MG CAPS capsule Take 0.4 mg by mouth daily.     No current facility-administered medications for this encounter.     Allergies  Allergen Reactions  . Ibuprofen Nausea Only      Social History   Socioeconomic History  . Marital status: Widowed    Spouse name: Not on file  . Number of children: Not on file  . Years of education: Not on file  . Highest education level: Not on file  Occupational History  . Not on file  Social Needs  . Financial resource strain: Not on file  . Food insecurity    Worry: Not on file    Inability: Not on file  . Transportation needs    Medical: Not on file    Non-medical: Not on file  Tobacco Use  . Smoking status: Former Smoker  Packs/day: 0.50    Types: Cigarettes    Quit date: 03/07/1993    Years since quitting: 26.7  . Smokeless tobacco: Never Used  Substance and Sexual Activity  . Alcohol use: No  . Drug use: No  . Sexual activity: Not on file  Lifestyle  . Physical activity    Days per week: Not on file    Minutes per session: Not on file  . Stress: Not on file  Relationships  . Social Herbalist on phone: Not on file    Gets together: Not on file    Attends religious service: Not on file    Active member of club or organization: Not on file    Attends meetings of clubs or organizations: Not on file    Relationship status: Not on file  . Intimate partner violence    Fear of current or ex partner: Not on file    Emotionally abused: Not on file    Physically abused: Not on file    Forced sexual activity: Not on file  Other Topics  Concern  . Not on file  Social History Narrative  . Not on file      Family History  Problem Relation Age of Onset  . Cancer Mother   . Heart disease Father     Vitals:   11/28/19 1126  BP: (!) 80/50  Pulse: 76  SpO2: 95%  Weight: 87.2 kg (192 lb 3.2 oz)   Wt Readings from Last 3 Encounters:  11/28/19 87.2 kg (192 lb 3.2 oz)  11/21/19 86.5 kg (190 lb 12.8 oz)  11/07/19 98.3 kg (216 lb 12.8 oz)    PHYSICAL EXAM: General:  Well appearing. No resp difficulty HEENT: normal Neck: supple. no JVD. Carotids 2+ bilat; no bruits. No lymphadenopathy or thryomegaly appreciated. Cor: PMI nondisplaced. Regular rate & rhythm. No rubs, gallops or murmurs. Lungs: clear Abdomen: soft, nontender, nondistended. No hepatosplenomegaly. No bruits or masses. Good bowel sounds. Extremities: no cyanosis, clubbing, rash, edema Neuro: alert & orientedx3, cranial nerves grossly intact. moves all 4 extremities w/o difficulty. Affect pleasant   ASSESSMENT & PLAN: 1. Chronic Systolic Heart Failure  - due to NICM echo 2011 EF 10-15%. Cath 2011 minimal CAD - Echo 2014 EF 25-30% - ECHO 06/2019 EF 25-30% mod MR.  - NYHA II. Volume status looks low with low BP - Stop lasix completely - use only PRN.  Recheck BP Thursday - Continue entresto 24-26 mg twice a day for now. IF SBP remains below 90 will need to stop  - Continue carvedilol 3.125 bid - Spiro on hold due to low BP  - Check ReDS and labs - Had cMRI earlier today to look for infiltrative process and reassess EF. Awaiting results. If EF <=35% he is ok with ICD. QRS is 124ms so not CRT candidate    2. HTN  - BP remains low. Plan as above  3. DM2 - On metformin.  - consider SGLT2i when BP improved  4. MR - moderate on last echo. Await MRI to reassess   Glori Bickers, MD 11/28/19

## 2019-11-29 ENCOUNTER — Telehealth (HOSPITAL_COMMUNITY): Payer: Self-pay

## 2019-11-29 NOTE — Telephone Encounter (Signed)
Received call from Dr Aris Lot regarding patient.  She requested office notes and labs to be faxed to her office however message cut off and fax number not taken down. LM on her phone to call our office to provide fax number.

## 2019-12-01 NOTE — Telephone Encounter (Signed)
Dr Aris Lot called back and left VM stating her fax number is [phone] and she needs pt's last OV night and labs faxed to her.  Records faxed via epic

## 2019-12-05 ENCOUNTER — Other Ambulatory Visit (HOSPITAL_COMMUNITY): Payer: Medicare HMO

## 2019-12-05 ENCOUNTER — Other Ambulatory Visit (HOSPITAL_COMMUNITY): Payer: Self-pay

## 2019-12-05 DIAGNOSIS — I5022 Chronic systolic (congestive) heart failure: Secondary | ICD-10-CM

## 2019-12-06 ENCOUNTER — Other Ambulatory Visit: Payer: Self-pay

## 2019-12-06 ENCOUNTER — Encounter (HOSPITAL_COMMUNITY): Payer: Medicare HMO

## 2019-12-06 ENCOUNTER — Other Ambulatory Visit: Payer: Medicare HMO | Admitting: Nurse Practitioner

## 2019-12-06 ENCOUNTER — Other Ambulatory Visit (HOSPITAL_COMMUNITY): Payer: Self-pay | Admitting: Adult Health

## 2019-12-09 ENCOUNTER — Other Ambulatory Visit: Payer: Self-pay

## 2019-12-09 ENCOUNTER — Other Ambulatory Visit: Payer: Medicare HMO | Admitting: Nurse Practitioner

## 2019-12-09 ENCOUNTER — Encounter: Payer: Self-pay | Admitting: Nurse Practitioner

## 2019-12-09 DIAGNOSIS — I509 Heart failure, unspecified: Secondary | ICD-10-CM

## 2019-12-09 DIAGNOSIS — Z515 Encounter for palliative care: Secondary | ICD-10-CM

## 2019-12-09 NOTE — Progress Notes (Signed)
Trevor Rodriguez Consult Note Telephone: [phone]  Fax: [phone]  PATIENT NAME: Trevor Rodriguez DOB: 1941/04/25 MRN: [id number]  PRIMARY CARE PROVIDER:  Alvester Chou NP REFERRING PROVIDER:  Alvester Chou, NP RESPONSIBLE PARTY:   Self; Daughter Trevor Rodriguez [phone]  Due to the COVID-19 crisis, this visit was done via telemedicine from my office and it was initiated and consent by this patient and or family.  I was asked to see Trevor Rodriguez by Trevor Chou, NP for Palliative care visit for goals of care  RECOMMENDATIONS and PLAN:  1. ACP: full code, would like to "try once" to be resuscitated. Trevor Rodriguez and Trevor Rodriguez in agreement to further discussion of Trevor Rodriguez, code status. Will mail blank MOST form and Hard Choice book to review and revisit at next [name] visit  2. Palliative care encounter Palliative medicine team will continue to support patient, patient's family, and medical team. Visit consisted of counseling and education dealing with the complex and emotionally intense issues of symptom management and palliative care in the setting of serious and potentially life-threatening illness  I spent 65 minutes providing this consultation,  from 12:30pm to 1:35pm. More than 50% of the time in this consultation was spent coordinating communication.   HISTORY OF PRESENT ILLNESS:  Trevor Rodriguez is a 78 y.o. 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 Recent hospitalization 7 / 5 / 2020 to 7 / 6 / 2020 for chest tightness, shortness of breath. Last Cardiology appointment Dr Mahalia Longest at 12 / 7 / 2020 with hypotension 80/40 and felt to be volume depleted decrease Lasix to 80 mg a day. Scheduled initial palliative care visit by telemedicine video. I called Trevor Rodriguez, Trevor Rodriguez daughter for video. The video did work but the audio did not  say we talked telephonic while continuing to keep the video on. I met with Trevor Rodriguez and Trevor Rodriguez. We talked about purpose of palliative care visit. Trevor Rodriguez in agreement. We talked about past medical history in the setting of chronic disease and progression. We talked about congestive heart failure at length. We talked about the pathophysiology of the heart. We talked about documented EF. 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 We talked about symptoms of pain but she denies. We talked about shortness of breath. We talked about congestive heart failure and fluid overload, symptoms would she currently is not experiencing. We talked about medications. We talked about diet with sodium. We talked about appetite. We talked about medical goals of care including aggressive versus conservative versus comfort care. We talked about internal defibrillator. We talked about upcoming Cardiology appointment. We talked about code status. At present Trevor Rodriguez is a full code. Trevor Rodriguez endorses he wants them just to "try once to revive him". Trevor Rodriguez it also is open to having a blank MOST form in Hard Choice book mailed for review at next palliative care visit. We talked about Trevor Rodriguez as previously discussed with Trevor Rodriguez Nurse Practitioner. Trevor Rodriguez endorses that she and Trevor Rodriguez are interested in the Trevor Rodriguez. Trevor Rodriguez endorses he is in agreement to have his chart reviewed for Trevor Rodriguez. Will send notes to Cardiology also to see their thoughts for services. We talked about role of palliative care and plan of care. Therapeutic listening and emotional support provided. Discuss will  follow up in 2 weeks if needed or sooner should he declined. Trevor Rodriguez and Trevor Rodriguez in agreement. Telemedicine by video appointment scheduled. Questions answered to satisfaction. Contact information provided. Palliative  Care was asked to help address goals of care.   CODE STATUS: Full code  PPS: 50% HOSPICE ELIGIBILITY/DIAGNOSIS: TBD  PAST MEDICAL HISTORY:  Past Medical History:  Diagnosis Date  . Congestive heart failure (HCC)    Ejection fraction around 15%. He was recently horpitalized for heart failure and had a heart catheterization. He had minor luminal irregularities.  . Hyperlipidemia   . Hypertension     SOCIAL HX:  Social History   Tobacco Use  . Smoking status: Former Smoker    Packs/day: 0.50    Types: Cigarettes    Quit date: 03/07/1993    Years since quitting: 26.7  . Smokeless tobacco: Never Used  Substance Use Topics  . Alcohol use: No    ALLERGIES:  Allergies  Allergen Reactions  . Ibuprofen Nausea Only     PERTINENT MEDICATIONS:  Outpatient Encounter Medications as of 12/09/2019  Medication Sig  . aspirin 81 MG EC tablet Take 81 mg by mouth daily.   . Blood Glucose Monitoring Suppl ([place]) DEVI   . carvedilol (COREG) 3.125 MG tablet TAKE 1 TABLET (3.125 MG TOTAL) BY MOUTH 2 (TWO) TIMES DAILY WITH A MEAL.  . furosemide (LASIX) 40 MG tablet Take 1 tablet (40 mg total) by mouth daily as needed.  . metFORMIN (GLUCOPHAGE) 500 MG tablet Take 1 tablet (500 mg total) by mouth 2 (two) times daily with a meal.  . QUEtiapine (SEROQUEL) 25 MG tablet Take 25 mg by mouth daily as needed.   Marland Kitchen RELION PRIME TEST test strip   . RELION ULTRA THIN PLUS LANCETS MISC   . sacubitril-valsartan (ENTRESTO) 24-26 MG Take 1 tablet by mouth 2 (two) times daily.  . simvastatin (ZOCOR) 40 MG tablet Take 40 mg by mouth daily.  . tamsulosin (FLOMAX) 0.4 MG CAPS capsule Take 0.4 mg by mouth daily.   No facility-administered encounter medications on file as of 12/09/2019.    PHYSICAL EXAM:   Deferred  Tomika Eckles Z Aiyannah Fayad, NP

## 2019-12-29 ENCOUNTER — Other Ambulatory Visit: Payer: Self-pay

## 2019-12-29 ENCOUNTER — Telehealth (HOSPITAL_COMMUNITY): Payer: Self-pay | Admitting: *Deleted

## 2019-12-29 ENCOUNTER — Encounter (HOSPITAL_COMMUNITY): Payer: Self-pay

## 2019-12-29 ENCOUNTER — Ambulatory Visit (HOSPITAL_COMMUNITY)
Admission: RE | Admit: 2019-12-29 | Discharge: 2019-12-29 | Disposition: A | Discharge status: Home / Self Care | Payer: Medicare HMO | Source: Ambulatory Visit | Attending: Cardiology | Admitting: Cardiology

## 2019-12-29 VITALS — BP 132/86 | HR 114 | Wt 212.4 lb

## 2019-12-29 DIAGNOSIS — Z8249 Family history of ischemic heart disease and other diseases of the circulatory system: Secondary | ICD-10-CM | POA: Diagnosis not present

## 2019-12-29 DIAGNOSIS — I110 Hypertensive heart disease with heart failure: Secondary | ICD-10-CM | POA: Insufficient documentation

## 2019-12-29 DIAGNOSIS — I2510 Atherosclerotic heart disease of native coronary artery without angina pectoris: Secondary | ICD-10-CM | POA: Diagnosis not present

## 2019-12-29 DIAGNOSIS — Z809 Family history of malignant neoplasm, unspecified: Secondary | ICD-10-CM | POA: Insufficient documentation

## 2019-12-29 DIAGNOSIS — Z886 Allergy status to analgesic agent status: Secondary | ICD-10-CM | POA: Insufficient documentation

## 2019-12-29 DIAGNOSIS — Z79899 Other long term (current) drug therapy: Secondary | ICD-10-CM | POA: Insufficient documentation

## 2019-12-29 DIAGNOSIS — Z87891 Personal history of nicotine dependence: Secondary | ICD-10-CM | POA: Insufficient documentation

## 2019-12-29 DIAGNOSIS — E785 Hyperlipidemia, unspecified: Secondary | ICD-10-CM | POA: Insufficient documentation

## 2019-12-29 DIAGNOSIS — E119 Type 2 diabetes mellitus without complications: Secondary | ICD-10-CM | POA: Diagnosis not present

## 2019-12-29 DIAGNOSIS — Z7984 Long term (current) use of oral hypoglycemic drugs: Secondary | ICD-10-CM | POA: Diagnosis not present

## 2019-12-29 DIAGNOSIS — I428 Other cardiomyopathies: Secondary | ICD-10-CM | POA: Diagnosis not present

## 2019-12-29 DIAGNOSIS — I5022 Chronic systolic (congestive) heart failure: Secondary | ICD-10-CM

## 2019-12-29 DIAGNOSIS — Z7982 Long term (current) use of aspirin: Secondary | ICD-10-CM | POA: Diagnosis not present

## 2019-12-29 LAB — BASIC METABOLIC PANEL
Anion gap: 9 (ref 5–15)
BUN: 13 mg/dL (ref 8–23)
CO2: 24 mmol/L (ref 22–32)
Calcium: 8.7 mg/dL — ABNORMAL LOW (ref 8.9–10.3)
Chloride: 106 mmol/L (ref 98–111)
Creatinine, Ser: 1.05 mg/dL (ref 0.61–1.24)
GFR calc Af Amer: >60 mL/min (ref >60)
GFR calc non Af Amer: >60 mL/min (ref >60)
Glucose, Bld: 95 mg/dL (ref 70–99)
Potassium: 3.9 mmol/L (ref 3.5–5.1)
Sodium: 139 mmol/L (ref 135–145)

## 2019-12-29 MED ORDER — EMPAGLIFLOZIN 10 MG PO TABS: 10.0000 mg | ORAL_TABLET | Freq: Every day | ORAL | 11 refills | Status: DC

## 2019-12-29 NOTE — Progress Notes (Signed)
Advanced [facility] Failure Clinic Note   PCP: Alvester Chou PCP-Cardiologist: Dr Haroldine Laws   HPI:  Trevor Rodriguez is a 79 y.o. 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 RV systolic function severely reduced. PA pk pressure 49 mmHg.  Cardiac cath with minimal irregularities.  Echo 2014 with EF 25-30%  LV moderately dilated, trivial AR, mild to mod MR, RA mildly dilated, PA pk pressure 39 mmHg   Had been lost to f/u since 2014 and admitted to [facility] in [facility]/26/19 with CP and HF. Trop negative, An ECHO was completed and showed EF 25-30% moderate MR which was similar to previous ECHO in 2014. Diuresed with  IV lasix and transitioned to lasix 40 mg daily. HF medications adjusted.   Cardiac MRI 12/20 showed severe LV dilation with severely reduced systolic function. LVEF 15%. No amyloid. Referral placed to EP for ICD. Appt not completed yet.   He presents to clinic today for f/u. It appears over last several weeks, he has required reduction in his diuretics and Entresto dose due to issues w/ low BP. Was previously on bid torsemide and eventually weaned to once daily and now only takes PRN based on wts. Entesto dose weaned down to low dose 24-26 bid.   Today in f/u, he reports that he feels better. BP stable 132/86. Denies symptoms of dizziness, syncope/ near syncope. Tolerating low dose Entesto ok. Has not used any PRN Lasix at home and wt now up 20 lb from 192>>212 lb. LEE noted on exam. No resting dyspnea. Has mild dyspnea w/ mild activity. Notes some exertional fatigue. No CP.     Past Medical History:  Diagnosis Date  . Congestive heart failure (HCC)    Ejection fraction around 15%. He was recently horpitalized for heart failure and had a heart catheterization. He had minor luminal irregularities.  . Hyperlipidemia   . Hypertension     Current  Outpatient Medications  Medication Sig Dispense Refill  . aspirin 81 MG EC tablet Take 81 mg by mouth daily.     . Blood Glucose Monitoring Suppl ([place]) DEVI     . carvedilol (COREG) 3.125 MG tablet TAKE 1 TABLET (3.125 MG TOTAL) BY MOUTH 2 (TWO) TIMES DAILY WITH A MEAL. 180 tablet 2  . furosemide (LASIX) 40 MG tablet Take 1 tablet (40 mg total) by mouth daily as needed. 60 tablet 5  . metFORMIN (GLUCOPHAGE) 500 MG tablet Take 1 tablet (500 mg total) by mouth 2 (two) times daily with a meal. 60 tablet 0  . QUEtiapine (SEROQUEL) 25 MG tablet Take 25 mg by mouth daily as needed.     Marland Kitchen RELION PRIME TEST test strip     . RELION ULTRA THIN PLUS LANCETS MISC     . sacubitril-valsartan (ENTRESTO) 24-26 MG Take 1 tablet by mouth 2 (two) times daily. 60 tablet 3  . simvastatin (ZOCOR) 40 MG tablet Take 40 mg by mouth daily.    . tamsulosin (FLOMAX) 0.4 MG CAPS capsule Take 0.4 mg by mouth daily.    . empagliflozin (JARDIANCE) 10 MG TABS tablet Take 10 mg by mouth daily before breakfast. 30 tablet 11   No current facility-administered medications for this encounter.    Allergies  Allergen Reactions  . Ibuprofen Nausea Only      Social History  Socioeconomic History  . Marital status: Widowed    Spouse name: Not on file  . Number of children: Not on file  . Years of education: Not on file  . Highest education level: Not on file  Occupational History  . Not on file  Tobacco Use  . Smoking status: Former Smoker    Packs/day: 0.50    Types: Cigarettes    Quit date: 03/07/1993    Years since quitting: 26.8  . Smokeless tobacco: Never Used  Substance and Sexual Activity  . Alcohol use: No  . Drug use: No  . Sexual activity: Not on file  Other Topics Concern  . Not on file  Social History Narrative  . Not on file   Social Determinants of Health   Financial Resource Strain:   . Difficulty of Paying Living Expenses: Not on file  Food Insecurity:   . Worried About  Programme researcher, broadcasting/film/video in the Last Year: Not on file  . Ran Out of Food in the Last Year: Not on file  Transportation Needs:   . Lack of Transportation (Medical): Not on file  . Lack of Transportation (Non-Medical): Not on file  Physical Activity:   . Days of Exercise per Week: Not on file  . Minutes of Exercise per Session: Not on file  Stress:   . Feeling of Stress : Not on file  Social Connections:   . Frequency of Communication with Friends and Family: Not on file  . Frequency of Social Gatherings with Friends and Family: Not on file  . Attends Religious Services: Not on file  . Active Member of Clubs or Organizations: Not on file  . Attends Banker Meetings: Not on file  . Marital Status: Not on file  Intimate Partner Violence:   . Fear of Current or Ex-Partner: Not on file  . Emotionally Abused: Not on file  . Physically Abused: Not on file  . Sexually Abused: Not on file      Family History  Problem Relation Age of Onset  . Cancer Mother   . Heart disease Father     Vitals:   12/29/19 1126  BP: 132/86  Pulse: (!) 114  SpO2: 96%  Weight: 96.3 kg (212 lb 6.4 oz)   Wt Readings from Last 3 Encounters:  12/29/19 96.3 kg (212 lb 6.4 oz)  11/28/19 87.2 kg (192 lb 3.2 oz)  11/21/19 86.5 kg (190 lb 12.8 oz)    PHYSICAL EXAM: General:  Well appearing elderly AAM. No resp difficulty HEENT: normal Neck: supple. no JVD. Carotids 2+ bilat; no bruits. No lymphadenopathy or thryomegaly appreciated. Cor: PMI nondisplaced. Regular rate & rhythm. No rubs, gallops or murmurs. Lungs: clear Abdomen: soft, nontender, nondistended. No hepatosplenomegaly. No bruits or masses. Good bowel sounds. Extremities: no cyanosis, clubbing, rash, 1+ bilateral LE edema Neuro: alert & orientedx3, cranial nerves grossly intact. moves all 4 extremities w/o difficulty. Affect pleasant   ASSESSMENT & PLAN: 1. Chronic Systolic Heart Failure  - due to NICM echo 2011 EF 10-15%. Cath 2011  minimal CAD - Echo 2014 EF 25-30% - ECHO 06/2019 EF 25-30% mod MR.  - cMRI 12/20 showed severe LV dilation with severely reduced systolic function. LVEF = 15%. No amyloid.  - NYHA II. Volume up. Edema on exam, Wt up after lasix discontinuation.  - Continue Lasix PRN.   - Will try adding a SGLT2i, Jardiance 10 mg daily. Will need to monitor closely for volume depletion. Check BMP today and  again in 7 days w/ close f/u visit - Continue entresto 24-26 mg twice a day for now. - Continue carvedilol 3.125 bid - Will not resume Cleda Daub (last several BMPs w/ K between 5.4-5.7) - Referral placed for EP evaluation for ICD. QRS is so not CRT candidate    2. HTN  - BP normotensive. - will need to monitor closely  w/ addition of Jardiance   3. DM2 - On metformin.  - Add 10 mg of Jardiance  Check BMP today and again in 7 days w/ close outpatient f/u    Robbie Lis, PA-C 12/29/19

## 2019-12-29 NOTE — Telephone Encounter (Signed)
Pt aware and agreeable, referral placed   Trevor Rodriguez, [facility]  12/22/2019 11:45 AM EST    Attempted to contact patient No answer, unable to leave message  [phone] (H)   Bensimhon, Bevelyn Buckles, MD  12/20/2019 11:55 AM EST    EF < 35% No amyloid. Please refer to EP for possible ICD

## 2019-12-29 NOTE — Patient Instructions (Signed)
Lab work done today. We will notify you of any abnormal lab work. No news is good news!  START Jardiance 10mg  daily.  Please follow up with the [facility] Failure Clinic in 1-2 weeks.   At the [facility], you and your health needs are our priority. As part of our continuing mission to provide you with exceptional heart care, we have created designated Provider Care Teams. These Care Teams include your primary Cardiologist (physician) and Advanced Practice Providers (APPs- Physician Assistants and Nurse Practitioners) who all work together to provide you with the care you need, when you need it.   You may see any of the following providers on your designated Care Team at your next follow up: Dr Marland Kitchen . Dr Arvilla Meres . Marca Ancona, NP . Tonye Becket, PA . Robbie Lis, PharmD   Please be sure to bring in all your medications bottles to every appointment.

## 2019-12-29 NOTE — Progress Notes (Signed)
ReDS Vest / Clip - 12/29/19 1100      ReDS Vest / Clip   Station Marker  C    Ruler Value  29.5    ReDS Value Range  Moderate volume overload    ReDS Actual Value  38

## 2019-12-30 ENCOUNTER — Other Ambulatory Visit: Payer: Medicare HMO | Admitting: Nurse Practitioner

## 2020-01-02 ENCOUNTER — Telehealth: Payer: Self-pay | Admitting: Nurse Practitioner

## 2020-01-02 NOTE — Telephone Encounter (Signed)
Spoke with daughter Meriam Sprague about rescheduling the Zoom Palliative f/u visit scheduled for 01/09/20 (due to office being closed) and this was rescheduled for 01/30/20 @ 12 Noon.

## 2020-01-05 ENCOUNTER — Other Ambulatory Visit (HOSPITAL_COMMUNITY): Payer: Self-pay

## 2020-01-05 ENCOUNTER — Telehealth (HOSPITAL_COMMUNITY): Payer: Self-pay | Admitting: Licensed Clinical Social Worker

## 2020-01-05 ENCOUNTER — Other Ambulatory Visit (HOSPITAL_COMMUNITY): Payer: Self-pay | Admitting: Internal Medicine

## 2020-01-05 MED ORDER — ENTRESTO 24-26 MG PO TABS: 1.0000 | ORAL_TABLET | Freq: Two times a day (BID) | ORAL | 11 refills | Status: DC

## 2020-01-05 NOTE — Telephone Encounter (Signed)
Paramedicine Initial Assessment:  Housing:  In what kind of housing do you live? House/apt/trailer/shelter?house  Do you live with anyone? no  Are you currently worried about losing your housing? no  Within the past 12 months have you ever stayed outside, in a car, tent, a shelter, or temporarily with someone? no  Within the past 12 months have you been unable to get utilities when it was really needed? no  Social:  What is your current marital status? single  Do you have any children? Yes daughter   Food:  Within the past 12 months were you ever worried that food would run out before you got money to buy more? No though it sometimes gets tight  Within the past 25months have you run out of food and didn't have money to buy more? no  Income:  What is your current source of income? Retirement income about $1,700/month  How hard is it for you to pay for the basics like food housing, medical care, and utilities? Not very  Do you have outstanding medical bills? no  Insurance:  Are you currently insured? yes  Do you have prescription coverage? yes  Transportation:  Do you have transportation to your medical appointments? Yes   If yes, how? Drives himself  In the past 12 months has lack of transportation kept you from medical appts or from getting medications? no  In the past 12 months has lack of transportation kept you from meetings, work, or getting things you needed? no   Daily Health Needs: Do you have a working scale at home? yes  How do you manage your medications at home? Pill box  Do you ever take your medications differently than prescribed? no  Do you have issues affording your medications? Sometimes is hard but can usually manage  If yes, has this ever prevented you from obtaining medications? no  Do you have any concerns with mobility at home? no  Do you use any assistive devices at home or have PCS at home? no   Are there any additional barriers you  see to getting the care you need?no  CSW will continue to follow through paramedicine program and assist as needed.  Burna Sis, LCSW Clinical Social Worker [facility] Desk#: [phone] Cell#: [phone]

## 2020-01-05 NOTE — Telephone Encounter (Signed)
Pt on entresto and struggling with all of his medication costs- CSW explained about [employer] and pt agreeable to applying- pt approved  Member ID: [id number] Group ID: [id number] RxBin ID: [id number] PCN: [id number] Eligibility Start Date: 10/07/2019 Eligibility End Date: 01/03/2021 Assistance Amount: $1,000.00  Burna Sis, LCSW Clinical Social Worker [facility] Desk#: [phone] Cell#: [phone]

## 2020-01-10 ENCOUNTER — Telehealth (HOSPITAL_COMMUNITY): Payer: Self-pay

## 2020-01-10 ENCOUNTER — Ambulatory Visit (HOSPITAL_COMMUNITY)
Admission: RE | Admit: 2020-01-10 | Discharge: 2020-01-10 | Disposition: A | Discharge status: Home / Self Care | Payer: Medicare HMO | Source: Ambulatory Visit | Attending: Cardiology | Admitting: Cardiology

## 2020-01-10 ENCOUNTER — Encounter (HOSPITAL_COMMUNITY): Payer: Self-pay

## 2020-01-10 ENCOUNTER — Other Ambulatory Visit: Payer: Self-pay

## 2020-01-10 VITALS — BP 130/78 | HR 97 | Wt 212.0 lb

## 2020-01-10 DIAGNOSIS — Z7984 Long term (current) use of oral hypoglycemic drugs: Secondary | ICD-10-CM | POA: Insufficient documentation

## 2020-01-10 DIAGNOSIS — I2510 Atherosclerotic heart disease of native coronary artery without angina pectoris: Secondary | ICD-10-CM | POA: Insufficient documentation

## 2020-01-10 DIAGNOSIS — Z7982 Long term (current) use of aspirin: Secondary | ICD-10-CM | POA: Insufficient documentation

## 2020-01-10 DIAGNOSIS — E119 Type 2 diabetes mellitus without complications: Secondary | ICD-10-CM | POA: Insufficient documentation

## 2020-01-10 DIAGNOSIS — I110 Hypertensive heart disease with heart failure: Secondary | ICD-10-CM | POA: Diagnosis not present

## 2020-01-10 DIAGNOSIS — I5022 Chronic systolic (congestive) heart failure: Secondary | ICD-10-CM | POA: Diagnosis present

## 2020-01-10 DIAGNOSIS — E785 Hyperlipidemia, unspecified: Secondary | ICD-10-CM | POA: Insufficient documentation

## 2020-01-10 DIAGNOSIS — Z79899 Other long term (current) drug therapy: Secondary | ICD-10-CM | POA: Insufficient documentation

## 2020-01-10 DIAGNOSIS — Z87891 Personal history of nicotine dependence: Secondary | ICD-10-CM | POA: Insufficient documentation

## 2020-01-10 DIAGNOSIS — I951 Orthostatic hypotension: Secondary | ICD-10-CM | POA: Insufficient documentation

## 2020-01-10 DIAGNOSIS — I428 Other cardiomyopathies: Secondary | ICD-10-CM | POA: Diagnosis not present

## 2020-01-10 DIAGNOSIS — Z8249 Family history of ischemic heart disease and other diseases of the circulatory system: Secondary | ICD-10-CM | POA: Diagnosis not present

## 2020-01-10 LAB — BASIC METABOLIC PANEL
Anion gap: 9 (ref 5–15)
BUN: 12 mg/dL (ref 8–23)
CO2: 25 mmol/L (ref 22–32)
Calcium: 9.1 mg/dL (ref 8.9–10.3)
Chloride: 108 mmol/L (ref 98–111)
Creatinine, Ser: 1.09 mg/dL (ref 0.61–1.24)
GFR calc Af Amer: >60 mL/min (ref >60)
GFR calc non Af Amer: >60 mL/min (ref >60)
Glucose, Bld: 110 mg/dL — ABNORMAL HIGH (ref 70–99)
Potassium: 3.5 mmol/L (ref 3.5–5.1)
Sodium: 142 mmol/L (ref 135–145)

## 2020-01-10 NOTE — Telephone Encounter (Signed)
-----   Message from Allayne Butcher, [place] sent at 01/10/2020  2:37 PM EST ----- Kidney function and K ok. Add spironolactone 12.5 mg daily to regimen. Return for f/u BMP in 1 week and keep f/u with PharmD in 2 weeks

## 2020-01-10 NOTE — Telephone Encounter (Signed)
Called pt to relay lab results and med changes. Pt only number gave a busy signal. Will try again later.

## 2020-01-10 NOTE — Progress Notes (Signed)
Advanced Heart Failure Clinic Note   PCP: Alvester Chou PCP-Cardiologist: Dr Haroldine Laws   Reason for Visit: F/u for Chronic Systolic Heart Failure   HPI: Trevor Rodriguez is a 79 y.o. 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 RV systolic function severely reduced. PA pk pressure 49 mmHg.  Cardiac cath with minimal irregularities.  Echo 2014 with EF 25-30%  LV moderately dilated, trivial AR, mild to mod MR, RA mildly dilated, PA pk pressure 39 mmHg   Had been lost to f/u since 2014 and admitted to [facility] in 06/26/19 with CP and HF. Trop negative, An ECHO was completed and showed EF 25-30% moderate MR which was similar to previous ECHO in 2014. Diuresed with  IV lasix and transitioned to lasix 40 mg daily. HF medications adjusted.   Cardiac MRI 12/20 showed severe LV dilation with severely reduced systolic function. LVEF 15%. No amyloid. Referral placed to EP for ICD. Appt not completed yet.   He presents to clinic today for f/u. It appears over last several weeks, he has required reduction in his diuretics and Entresto dose due to issues w/ low BP. Was previously on bid torsemide and eventually weaned to once daily and now only takes PRN based on wts. Entesto dose weaned down to low dose 24-26 bid.   At last clinic visit 12/29/19, he reported feeling better. BP was stable at 132/86. Denied symptoms of dizziness, syncope/ near syncope. Was tolerating low dose Entesto ok. Had not used any PRN Lasix at home and his wt had subsequently increased by 20 lb from 192>>212 lb. LEE noted on exam. No resting dyspnea but endorsed mild dyspnea w/ mild activity. Labs showed normal SCr at 1.05 and K 3.9. Jardiance 10 mg was added to regimen. We also placed referral for paramedicine to assist w/ medications.     Presents back today for f/u. Not currently taking  Jardiance. States he was told by PCP not to take. Unsure why. Has been compliant w/ all other HF meds. Recently used PRN Lasix 40 mg daily x 3 days for increased wt gain and dyspnea. He states his wt and symptoms improved. Still volume overloaded on my exam w/ 1+ bilateral LEE but no resting dyspnea. Continues w/ mild dyspnea with moderate activity. No CP. BP stable 130/78.    Past Medical History:  Diagnosis Date  . Congestive heart failure (HCC)    Ejection fraction around 15%. He was recently horpitalized for heart failure and had a heart catheterization. He had minor luminal irregularities.  . Hyperlipidemia   . Hypertension     Current Outpatient Medications  Medication Sig Dispense Refill  . aspirin 81 MG EC tablet Take 81 mg by mouth daily.     . Blood Glucose Monitoring Suppl ([place]) DEVI     . carvedilol (COREG) 3.125 MG tablet TAKE 1 TABLET (3.125 MG TOTAL) BY MOUTH 2 (TWO) TIMES DAILY WITH A MEAL. 180 tablet 2  . furosemide (LASIX) 40 MG tablet Take 1 tablet (40 mg total) by mouth daily as needed. 60 tablet 5  . metFORMIN (GLUCOPHAGE) 500 MG tablet Take 1 tablet (500 mg total) by mouth 2 (two) times daily with a meal. 60 tablet 0  . QUEtiapine (SEROQUEL) 25 MG tablet Take 25 mg by mouth daily as needed.     Marland Kitchen  RELION PRIME TEST test strip     . RELION ULTRA THIN PLUS LANCETS MISC     . sacubitril-valsartan (ENTRESTO) 24-26 MG Take 1 tablet by mouth 2 (two) times daily. 60 tablet 11  . simvastatin (ZOCOR) 40 MG tablet Take 40 mg by mouth daily.    . tamsulosin (FLOMAX) 0.4 MG CAPS capsule Take 0.4 mg by mouth daily.    . empagliflozin (JARDIANCE) 10 MG TABS tablet Take 10 mg by mouth daily before breakfast. (Patient not taking: Reported on 01/10/2020) 30 tablet 11   No current facility-administered medications for this encounter.    Allergies  Allergen Reactions  . Ibuprofen Nausea Only      Social History   Socioeconomic History  . Marital status: Widowed     Spouse name: Not on file  . Number of children: Not on file  . Years of education: Not on file  . Highest education level: Not on file  Occupational History  . Not on file  Tobacco Use  . Smoking status: Former Smoker    Packs/day: 0.50    Types: Cigarettes    Quit date: 03/07/1993    Years since quitting: 26.8  . Smokeless tobacco: Never Used  Substance and Sexual Activity  . Alcohol use: No  . Drug use: No  . Sexual activity: Not on file  Other Topics Concern  . Not on file  Social History Narrative  . Not on file   Social Determinants of Health   Financial Resource Strain:   . Difficulty of Paying Living Expenses: Not on file  Food Insecurity:   . Worried About Programme researcher, broadcasting/film/video in the Last Year: Not on file  . Ran Out of Food in the Last Year: Not on file  Transportation Needs:   . Lack of Transportation (Medical): Not on file  . Lack of Transportation (Non-Medical): Not on file  Physical Activity:   . Days of Exercise per Week: Not on file  . Minutes of Exercise per Session: Not on file  Stress:   . Feeling of Stress : Not on file  Social Connections:   . Frequency of Communication with Friends and Family: Not on file  . Frequency of Social Gatherings with Friends and Family: Not on file  . Attends Religious Services: Not on file  . Active Member of Clubs or Organizations: Not on file  . Attends Banker Meetings: Not on file  . Marital Status: Not on file  Intimate Partner Violence:   . Fear of Current or Ex-Partner: Not on file  . Emotionally Abused: Not on file  . Physically Abused: Not on file  . Sexually Abused: Not on file      Family History  Problem Relation Age of Onset  . Cancer Mother   . Heart disease Father     Vitals:   01/10/20 1032  BP: 130/78  Pulse: 97  SpO2: 97%  Weight: 96.2 kg (212 lb)   Wt Readings from Last 3 Encounters:  01/10/20 96.2 kg (212 lb)  12/29/19 96.3 kg (212 lb 6.4 oz)  11/28/19 87.2 kg (192  lb 3.2 oz)    PHYSICAL EXAM: General:  Well appearing. No resp difficulty HEENT: normal Neck: supple. no JVD. Carotids 2+ bilat; no bruits. No lymphadenopathy or thryomegaly appreciated. Cor: PMI nondisplaced. Regular rate & rhythm. No rubs, gallops or murmurs. Lungs: clear Abdomen: soft, nontender, nondistended. No hepatosplenomegaly. No bruits or masses. Good bowel sounds. Extremities: no cyanosis, clubbing, rash,  1+ bilateral LE edema Neuro: alert & orientedx3, cranial nerves grossly intact. moves all 4 extremities w/o difficulty. Affect pleasant   ASSESSMENT & PLAN: 1. Chronic Systolic Heart Failure  - due to NICM echo 2011 EF 10-15%. Cath 2011 minimal CAD - Echo 2014 EF 25-30% - ECHO 06/2019 EF 25-30% mod MR.  - cMRI 12/20 showed severe LV dilation with severely reduced systolic function. LVEF = 15%. No amyloid.  - NYHA II. Volume up. 1+ Edema on exam. - Continue Entresto 24-26 bid (previously did not tolerate higher doses due to orthostatic hypotension) - Check BMP today w/ plans to add spironolactone 12.5 mg daily if SCr and K stable.  - Continue Lasix PRN.   - I previously recommended a SGLT2i but pt advised by PCP not to take. I will consult PCP to see if any contraindications, as he would benefit from this from a CHF standpoint.  - Continue carvedilol 3.125 bid - Referral placed for EP evaluation for ICD. QRS is so not CRT candidate. Has consultation w/ Dr. Ladona Ridgel on 1/26. - we are working on getting him enrolled in our paramedicine program for assistance w/ medication management.    2. HTN  - BP normotensive. - check BMP today. If SCr and K normal, will add 12.5 mg of Spironolactone  3. DM2 - On metformin.  - as above, will discuss w/ PCP addition of a SGLT2i.   F/u in 1 week for repeat BMP after spironolactone addition. Provider f/u with either pharmD or APP for further med titration.   Robbie Lis, PA-C 01/10/20

## 2020-01-10 NOTE — Telephone Encounter (Signed)
COVID-19 pre-appointment screening questions:   Do you have a history of COVID-19 or a positive test result in the past 7-10 days? no  To the best of your knowledge, have you been in close contact with anyone with a confirmed diagnosis of COVID 19? no  Have you had any one or more of the following: Fever, chills, cough, shortness of breath (out of the normal for you) or any flu-like symptoms? no  Are you experiencing any of the following symptoms that is new or out of usual for you:  . Ear, nose or throat discomfort . Sore throat . Headache . Muscle Pain . Diarrhea . Loss of taste or smell   Reviewed all the following with patient: . Use of hand sanitizer when entering the building . Everyone is required to wear a mask in the building, if you do not have a mask we are happy to provide you with one when you arrive . NO Visitor guidelines   If patient answers YES to any of questions they must change to a virtual visit and place note in comments about symptoms  

## 2020-01-10 NOTE — Patient Instructions (Signed)
Lab work done today. We will notify you of any abnormal lab work. No news is good news!  Please follow up with the heart failure pharmacist in 2-3 weeks.  Please follow up with the Advanced Heart Failure Clinic in 8 weeks.  At the Advanced Heart Failure Clinic, you and your health needs are our priority. As part of our continuing mission to provide you with exceptional heart care, we have created designated Provider Care Teams. These Care Teams include your primary Cardiologist (physician) and Advanced Practice Providers (APPs- Physician Assistants and Nurse Practitioners) who all work together to provide you with the care you need, when you need it.   You may see any of the following providers on your designated Care Team at your next follow up: Marland Kitchen Dr Arvilla Meres . Dr Marca Ancona . Tonye Becket, NP . Robbie Lis, PA . Karle Plumber, PharmD   Please be sure to bring in all your medications bottles to every appointment.

## 2020-01-10 NOTE — Telephone Encounter (Signed)
Called pt regarding home visit this week, he had clinic visit this morning but I wasn't able to attend it due to me being tied up with another pt.  No answer and unable to LVM.   Kerry Hough, EMT-Paramedic 01/10/20

## 2020-01-17 ENCOUNTER — Institutional Professional Consult (permissible substitution): Payer: Medicare HMO | Admitting: Internal Medicine

## 2020-01-17 ENCOUNTER — Telehealth (INDEPENDENT_AMBULATORY_CARE_PROVIDER_SITE_OTHER): Payer: Medicare HMO | Admitting: Internal Medicine

## 2020-01-17 ENCOUNTER — Encounter: Payer: Self-pay | Admitting: Internal Medicine

## 2020-01-17 VITALS — Ht 66.0 in | Wt 208.0 lb

## 2020-01-17 DIAGNOSIS — I5022 Chronic systolic (congestive) heart failure: Secondary | ICD-10-CM

## 2020-01-17 DIAGNOSIS — Z8249 Family history of ischemic heart disease and other diseases of the circulatory system: Secondary | ICD-10-CM

## 2020-01-17 DIAGNOSIS — I5084 End stage heart failure: Secondary | ICD-10-CM | POA: Diagnosis not present

## 2020-01-17 DIAGNOSIS — I110 Hypertensive heart disease with heart failure: Secondary | ICD-10-CM | POA: Diagnosis not present

## 2020-01-17 DIAGNOSIS — Z7189 Other specified counseling: Secondary | ICD-10-CM

## 2020-01-17 DIAGNOSIS — Z87891 Personal history of nicotine dependence: Secondary | ICD-10-CM

## 2020-01-17 NOTE — Progress Notes (Signed)
Electrophysiology TeleHealth Note   Due to national recommendations of social distancing due to COVID 19, an audio/video telehealth visit is felt to be most appropriate for this patient at this time.  See MyChart message from today for the patient's consent to telehealth for [facility].   Date:  01/17/2020   ID:  Trevor Rodriguez, DOB [birth year], MRN [id number]  Location: patient's home  Provider location: [address], [place]  Evaluation Performed: Follow-up visit  PCP:  Corine Shelter, MD  Cardiologist:  No primary care provider on file. DB Electrophysiologist:  Dr Ladona Ridgel  Chief Complaint:  Referral for ICD insertion  History of Present Illness:    Trevor Rodriguez is a 79 y.o. male who presents via audio/video conferencing for a telehealth visit today.  The patient is a pleasant 79 yo man with a longstanding non-ischemic CM, chronic systolic heart failure on maximal medical therapy who has class 2 symptoms. He notes occaisional peripheral edema. The patient denies symptoms of fevers, chills, cough, or new SOB worrisome for COVID 19.  Past Medical History:  Diagnosis Date  . Congestive heart failure (HCC)    Ejection fraction around 15%. He was recently horpitalized for heart failure and had a heart catheterization. He had minor luminal irregularities.  . Hyperlipidemia   . Hypertension     Past Surgical History:  Procedure Laterality Date  . CARDIAC CATHETERIZATION  2011  . INCISION AND DRAINAGE ABSCESS N/A 02/20/2013   Procedure: Debridemnt of lower abdominal wall, suprapubic region, scrotum and right thigh;  Surgeon: Wilmon Arms. Corliss Skains, MD;  Location: [facility] OR;  Service: General;  Laterality: N/A;    Current Outpatient Medications  Medication Sig Dispense Refill  . aspirin 81 MG EC tablet Take 81 mg by mouth daily.     . Blood Glucose Monitoring Suppl (RELION PRIME MONITOR) DEVI     . carvedilol (COREG) 3.125 MG tablet TAKE 1 TABLET (3.125 MG TOTAL)  BY MOUTH 2 (TWO) TIMES DAILY WITH A MEAL. 180 tablet 2  . furosemide (LASIX) 40 MG tablet Take 1 tablet (40 mg total) by mouth daily as needed. 60 tablet 5  . metFORMIN (GLUCOPHAGE) 500 MG tablet Take 1 tablet (500 mg total) by mouth 2 (two) times daily with a meal. 60 tablet 0  . QUEtiapine (SEROQUEL) 25 MG tablet Take 25 mg by mouth daily as needed.     Marland Kitchen RELION PRIME TEST test strip     . RELION ULTRA THIN PLUS LANCETS MISC     . sacubitril-valsartan (ENTRESTO) 24-26 MG Take 1 tablet by mouth 2 (two) times daily. 60 tablet 11  . simvastatin (ZOCOR) 40 MG tablet Take 40 mg by mouth daily.    . tamsulosin (FLOMAX) 0.4 MG CAPS capsule Take 0.4 mg by mouth daily.     No current facility-administered medications for this visit.    Allergies:   Ibuprofen   Social History:  The patient  reports that he quit smoking about 26 years ago. His smoking use included cigarettes. He smoked 0.50 packs per day. He has never used smokeless tobacco. He reports that he does not drink alcohol or use drugs.   Family History:  The patient's family history includes Cancer in his mother; Heart disease in his father.   ROS:  Please see the history of present illness.   All other systems are personally reviewed and negative.    Exam:    Vital Signs:  Ht 5\' 6"  (1.676 m)  Wt 208 lb (94.3 kg)   BMI 33.57 kg/m    Labs/Other Tests and Data Reviewed:    Recent Labs: 06/26/2019: Hemoglobin 11.7; Magnesium 2.1; Platelets 201; TSH 1.319 08/30/2019: ALT 19 11/28/2019: B Natriuretic Peptide 295.4 01/10/2020: BUN 12; Creatinine, Ser 1.09; Potassium 3.5; Sodium 142   Wt Readings from Last 3 Encounters:  01/17/20 208 lb (94.3 kg)  01/10/20 212 lb (96.2 kg)  12/29/19 212 lb 6.4 oz (96.3 kg)     Other studies personally reviewed: Additional studies/ records that were reviewed today include:     ASSESSMENT & PLAN:    1.  Chronic systolic heart failure - I have discussed the treatment options with the patient and  the risks/benefits/goals/expectations of ICD insertion were reviewed and he will call us if he wishes to proceed with ICD insertion. He meets with Dr. Reine Just in a couple of weeks. 2. HTN - his bp was not checked today but has been controlled.  3. COVID 19 screen The patient denies symptoms of COVID 19 at this time.  The importance of social distancing was discussed today.  Follow-up:  Based on decision about ICD. Next remote: n/a  Current medicines are reviewed at length with the patient today.   The patient does not have concerns regarding his medicines.  The following changes were made today:  none  Labs/ tests ordered today include: none No orders of the defined types were placed in this encounter.    Patient Risk:  after full review of this patients clinical status, I feel that they are at moderate risk at this time.  Today, I have spent 25 minutes with the patient with telehealth technology discussing ICD insertion .    Signed, Cristopher Peru, MD  01/17/2020 11:03 AM     [address] [place] [address] [phone] (office) [phone] (fax)

## 2020-01-19 ENCOUNTER — Other Ambulatory Visit (HOSPITAL_COMMUNITY): Payer: Self-pay

## 2020-01-19 ENCOUNTER — Encounter (HOSPITAL_COMMUNITY): Payer: Self-pay

## 2020-01-19 NOTE — Progress Notes (Signed)
Letter sent for lab work

## 2020-01-19 NOTE — Progress Notes (Addendum)
Paramedicine Encounter    Patient ID: Trevor Rodriguez, male    DOB: May 05, 1941, 79 y.o.   MRN: [id number]   Patient Care Team: Corine Shelter, MD as PCP - General (Pulmonary Disease)  Patient Active Problem List   Diagnosis Date Noted  . Acute systolic HF (heart failure) (HCC) 06/26/2019  . 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 Type II or unspecified type diabetes mellitus without mention of complication, not stated as uncontrolled 02/22/2013  . CHF (congestive heart failure) (HCC) 02/20/2013  . Hyperglycemia 02/20/2013  . Essential hypertension, benign 02/20/2013  . Other and unspecified hyperlipidemia 02/20/2013    Current Outpatient Medications:  .  aspirin 81 MG EC tablet, Take 81 mg by mouth daily. , Disp: , Rfl:  .  Blood Glucose Monitoring Suppl (RELION PRIME MONITOR) DEVI, , Disp: , Rfl:  .  carvedilol (COREG) 3.125 MG tablet, TAKE 1 TABLET (3.125 MG TOTAL) BY MOUTH 2 (TWO) TIMES DAILY WITH A MEAL., Disp: 180 tablet, Rfl: 2 .  colchicine 0.6 MG tablet, Take 0.6 mg by mouth daily., Disp: , Rfl:  .  furosemide (LASIX) 40 MG tablet, Take 1 tablet (40 mg total) by mouth daily as needed., Disp: 60 tablet, Rfl: 5 .  metFORMIN (GLUCOPHAGE) 500 MG tablet, Take 1 tablet (500 mg total) by mouth 2 (two) times daily with a meal., Disp: 60 tablet, Rfl: 0 .  QUEtiapine (SEROQUEL) 25 MG tablet, Take 25 mg by mouth daily as needed. , Disp: , Rfl:  .  RELION PRIME TEST test strip, , Disp: , Rfl:  .  RELION ULTRA THIN PLUS LANCETS MISC, , Disp: , Rfl:  .  sacubitril-valsartan (ENTRESTO) 24-26 MG, Take 1 tablet by mouth 2 (two) times daily., Disp: 60 tablet, Rfl: 11 .  simvastatin (ZOCOR) 40 MG tablet, Take 40 mg by mouth at bedtime. , Disp: , Rfl:  .  tamsulosin (FLOMAX) 0.4 MG CAPS capsule, Take 0.4 mg by mouth daily., Disp: , Rfl:  Allergies  Allergen Reactions  . Ibuprofen Nausea Only      Social History   Socioeconomic History   . Marital status: Widowed    Spouse name: Not on file  . Number of children: Not on file  . Years of education: Not on file  . Highest education level: Not on file  Occupational History  . Not on file  Tobacco Use  . Smoking status: Former Smoker    Packs/day: 0.50    Types: Cigarettes    Quit date: 03/07/1993    Years since quitting: 26.8  . Smokeless tobacco: Never Used  Substance and Sexual Activity  . Alcohol use: No  . Drug use: No  . Sexual activity: Not on file  Other Topics Concern  . Not on file  Social History Narrative  . Not on file   Social Determinants of Health   Financial Resource Strain:   . Difficulty of Paying Living Expenses: Not on file  Food Insecurity:   . Worried About Programme researcher, broadcasting/film/video in the Last Year: Not on file  . Ran Out of Food in the Last Year: Not on file  Transportation Needs:   . Lack of Transportation (Medical): Not on file  . Lack of Transportation (Non-Medical): Not on file  Physical Activity:   . Days of Exercise per Week: Not on file  . Minutes of Exercise per Session: Not on file  Stress:   . Feeling of Stress :  Not on file  Social Connections:   . Frequency of Communication with Friends and Family: Not on file  . Frequency of Social Gatherings with Friends and Family: Not on file  . Attends Religious Services: Not on file  . Active Member of Clubs or Organizations: Not on file  . Attends Archivist Meetings: Not on file  . Marital Status: Not on file  Intimate Partner Violence:   . Fear of Current or Ex-Partner: Not on file  . Emotionally Abused: Not on file  . Physically Abused: Not on file  . Sexually Abused: Not on file    Physical Exam      Future Appointments  Date Time Provider Guys  01/30/2020 12:00 PM Gusler, Christin Z, NP [facility] None  01/31/2020  1:00 PM [facility] None  03/19/2020 11:00 AM Bensimhon, Shaune Pascal, MD [facility] None    BP 112/62   Pulse 86   Temp 98 F  (36.7 C)   Resp 16   Wt 208 lb (94.3 kg)   SpO2 99%   BMI 33.57 kg/m  CBG EMS-127  Weight yesterday-208 @ clinic Last visit weight-212 @ clinic    First visit with pt. He lives alone. He drives self to appointments.  He states he has been having issues with his gout.  He denies increased sob, no dizziness, no c/p.  Weight fluctuating and that is dependent on what he eats. He does eat fried foods, he keeps his fluid intake to less than 2L. I did notice a lot of bojangles boxes in his car on my way inside.  He is thinking of getting the ICD.  He asked a few questions about it and he said he probably would proceed with that.  meds verified and pill box filled for him.  His PCP prescribed him the colchicine.  He gets his meds from [facility] in [place].  He had a very old CBG monitor that no longer worked. He will reach to his PCP to request a new one. He did have edema to his legs but he said they looked better and the edema was not as bad as it was before.   Marylouise Stacks, [facility] Paramedic  01/23/20

## 2020-01-26 ENCOUNTER — Telehealth (HOSPITAL_COMMUNITY): Payer: Self-pay

## 2020-01-26 NOTE — Telephone Encounter (Signed)
Called pt and he reports he has not been feeling well, increased tiredness, he has had productive coughing, not sure about fevers. I suggested he go get COVID tested.  He said his daughter could help him with that.  Will f/u next week.   Kerry Hough, EMT-Paramedic  01/26/20

## 2020-01-27 ENCOUNTER — Ambulatory Visit: Payer: Medicare HMO | Attending: Internal Medicine

## 2020-01-27 DIAGNOSIS — Z20822 Contact with and (suspected) exposure to covid-19: Secondary | ICD-10-CM | POA: Insufficient documentation

## 2020-01-27 NOTE — Progress Notes (Signed)
PCP: Alvester Chou PCP-Cardiologist: Dr Haroldine Laws   HPI:  Trevor Rodriguez is a 79 y.o. 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  Prior hx with Echo in 2011 with EF 10% and diffuse hypokinesis, mild MR, LA moderately dilated. RV systolic function severely reduced. PA pk pressure 49 mmHg.  Cardiac cath with minimal irregularities.  Echo 2014 with EF 25-30%  LV moderately dilated, trivial AR, mild to mod MR, RA mildly dilated, PA pk pressure 39 mmHg   Had been lost to f/u since 2014 and admitted to [facility] in 06/26/19 with CP and HF. Trop negative, An ECHO was completed and showed EF 25-30%moderate MRwhich was similar to previous ECHO in 2014. Diuresed with IV lasix and transitioned to lasix 40 mg daily. HF medications adjusted.   Cardiac MRI 12/20 showed severe LV dilation with severely reduced systolic function. LVEF 15%. No amyloid. Referral placed to EP for ICD. Appt not completed yet.    It appears over last several weeks, he has required reduction in his diuretics and Entresto dose due to issues w/ low BP. Was previously on BID torsemide and eventually weaned to once daily and now only takes PRN based on weights. Entesto dose weaned down to low dose 24-26 mg BID.   At clinic visit on 12/29/19, he reported feeling better. BP was stable at 132/86. Denied symptoms of dizziness, syncope/ near syncope. Was tolerating low dose Entesto ok. Had not used any PRN Lasix at home and his wt had subsequently increased by 20 lb from 192>>212 lb. LEE noted on exam. No resting dyspnea but endorsed mild dyspnea w/ mild activity. Labs showed normal SCr at 1.05 and K 3.9. Jardiance 10 mg was added to regimen. We also placed referral for paramedicine to assist w/ medications.     Recently presented to HF Clinic on 01/10/20 with [name], PA-C. Not currently taking Jardiance. Stated he was told by PCP not to take. Unsure why. Had been compliant w/  all other HF meds. Recently used PRN Lasix 40 mg daily x 3 days for increased wt gain and dyspnea. He stated his wt and symptoms improved. Still volume overloaded on exam w/ 1+ bilateral LEE but no resting dyspnea. Continued to have mild dyspnea with moderate activity. No CP. BP stable 130/78.   Today he returns to HF clinic for pharmacist medication titration. At last visit with PA-C, spironolactone 12.5 mg daily was initiated. However, he never started the spironolactone (it was never called into the pharmacy because clinic staff could not reach him). Overall he was feeling fine today. He felt poorly this weekend, like he had a "cold". His COVID test was negative. No dizziness, lightheadedness, chest pain or palpitations. He complains of SOB with mild-moderate activity. Could walk from the clinic to the parking lot before needing to stop and catch his breath. He weighs himself daily at home. He has been taking furosemide 40 mg daily. He will take an extra 20 mg in the evening if he feels like he is retaining fluid. Has not taken any extra furosemide in the last week, but had planned on taking extra tonight. His weight in clinic today was 218 lbs, which is up 10 lbs from last visit in January. He has  1+ bilateral LEE. He states nurses are going to come out to his house tomorrow to help him wrap his legs. No PND or orthopnea. His appetite is fine. He tries  to follow a low sodium diet but he has been eating fast food recently. We discussed fluid restriction today.   HF Medications: Carvedilol 3.125 mg BID Entresto 24/26 mg BID Furosemide 40 mg daily  Has the patient been experiencing any side effects to the medications prescribed?  no  Does the patient have any problems obtaining medications due to transportation or finances?   No - has Medicare part D  Understanding of regimen: fair Understanding of indications: fair Potential of compliance: good - Katie Lynch with paramedicine is helping with his  medications.  Patient understands to avoid NSAIDs. Patient understands to avoid decongestants.    Pertinent Lab Values (01/10/2020): Marland Kitchen Serum creatinine 1.09, BUN 12, Potassium 3.5, Sodium 142, BNP 295.4 (11/28/19)  Vital Signs: . Weight: 218 lbs (last clinic weight: 208 lbs) . Blood pressure: 120/70  . Heart rate: 93   Assessment: 1. 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 Cath 2011 minimal CAD - Echo 2014 EF 25-30% - ECHO7/2020 EF 25-30% mod MR.  - cMRI 12/20 showed severe LV dilation with severely reduced systolic function. LVEF = 15%. No amyloid.  - NYHA II-III. Volume status is elevated. 1+ bilateral LEE on exam, weight up 10 lbs from last clinic visit.  - Take furosemide 40 mg BID x2 days, then resume furosemide 40 mg daily.   - Continue carvedilol 3.125 mg BID - Continue Entresto 24-26 bid (previously did not tolerate higher doses due to orthostatic hypotension) - Start spironolactone 12.5 mg daily. Repeat BMET in 1 week.  - Previously recommended a SGLT2i but pt advised by PCP not to take. I will consult PCP to see if any contraindications, as he would benefit from this from a CHF standpoint.  - Referral placed for EP evaluation for ICD. QRS is so not CRT candidate. Has consultation w/ Dr. Ladona Ridgel on 1/26.   2. HTN - BP normotensive. -Start spironolactone 12.5 mg daily, continue carvedilol and Entresto.   3. DM2 - On metformin.  - Previously prescribed Jardiance but PCP discontinued. Consider restarting at future visits.    Plan: 1) Medication changes: Based on clinical presentation, vital signs and recent labs will take furosemide 40 mg BID x2 days then resume 40 mg daily. Start spironolactone 12.5 mg daily 2) Labs: repeat BMET in 1 week 3) Follow-up: 3 weeks with NP/PA visit to assess volume status and additional medication titration.    Karle Plumber, PharmD, BCPS, BCCP, CPP Heart Failure Clinic Pharmacist [phone]

## 2020-01-29 LAB — NOVEL CORONAVIRUS, NAA: SARS-CoV-2, NAA: NOT DETECTED

## 2020-01-30 ENCOUNTER — Telehealth: Payer: Self-pay | Admitting: Nurse Practitioner

## 2020-01-30 ENCOUNTER — Other Ambulatory Visit: Payer: Medicare HMO | Admitting: Nurse Practitioner

## 2020-01-30 ENCOUNTER — Other Ambulatory Visit: Payer: Self-pay

## 2020-01-30 NOTE — Telephone Encounter (Signed)
I called Trevor Rodriguez for Mr. Loncar for scheduled telemedicine palliative care followup appointment. Trevor Rodriguez endorses she forgot and requested to reschedule which was done.

## 2020-01-31 ENCOUNTER — Ambulatory Visit (HOSPITAL_COMMUNITY)
Admission: RE | Admit: 2020-01-31 | Discharge: 2020-01-31 | Disposition: A | Discharge status: Home / Self Care | Payer: Medicare HMO | Source: Ambulatory Visit | Attending: Cardiology | Admitting: Cardiology

## 2020-01-31 ENCOUNTER — Other Ambulatory Visit: Payer: Self-pay

## 2020-01-31 ENCOUNTER — Other Ambulatory Visit (HOSPITAL_COMMUNITY): Payer: Self-pay

## 2020-01-31 VITALS — BP 120/70 | HR 93 | Wt 218.0 lb

## 2020-01-31 DIAGNOSIS — Z79899 Other long term (current) drug therapy: Secondary | ICD-10-CM | POA: Insufficient documentation

## 2020-01-31 DIAGNOSIS — I428 Other cardiomyopathies: Secondary | ICD-10-CM | POA: Insufficient documentation

## 2020-01-31 DIAGNOSIS — Z7984 Long term (current) use of oral hypoglycemic drugs: Secondary | ICD-10-CM | POA: Insufficient documentation

## 2020-01-31 DIAGNOSIS — E119 Type 2 diabetes mellitus without complications: Secondary | ICD-10-CM

## 2020-01-31 DIAGNOSIS — I5022 Chronic systolic (congestive) heart failure: Secondary | ICD-10-CM | POA: Insufficient documentation

## 2020-01-31 DIAGNOSIS — I110 Hypertensive heart disease with heart failure: Secondary | ICD-10-CM | POA: Insufficient documentation

## 2020-01-31 MED ORDER — SPIRONOLACTONE 25 MG PO TABS: 12.5000 mg | ORAL_TABLET | Freq: Every day | ORAL | 11 refills | Status: DC

## 2020-01-31 NOTE — Progress Notes (Signed)
Paramedicine Encounter   Patient ID: Trevor Rodriguez , male,   DOB: 1941/07/07,79 y.o.,  MRN: [id number]   Met patient in clinic today with provider.  Weight @ clinic-218 B/p-120/70 p-93 sp02-94  He states he is feeling alright, off and on. His weight is up 10lbs from last visit.  His spiro is being added-it wasn't sent to pharmacy so lauren did that.  We missed last week due to him being COVID tested due to him having coughing and possibly fever, it came back negative.  Will go by there either tomor or Thursday to fill pill box. He will be getting med from pharmacy.   Marylouise Stacks, [place] 01/31/2020

## 2020-01-31 NOTE — Patient Instructions (Signed)
It was a pleasure seeing you today!  MEDICATIONS: -We are changing your medications today -Start spironolactone 12.5 mg (1/2 tablet) daily -Take furosemide 40 mg (1 tablet) two times a day for 2 days, the resume 40 daily. -Call if you have questions about your medications.  NEXT APPOINTMENT: Return to clinic in 3 weeks with NP Visit.   In general, to take care of your heart failure: -Limit your fluid intake to 2 Liters (half-gallon) per day.   -Limit your salt intake to ideally 2-3 grams (2000-3000 mg) per day. -Weigh yourself daily and record, and bring that "weight diary" to your next appointment.  (Weight gain of 2-3 pounds in 1 day typically means fluid weight.) -The medications for your heart are to help your heart and help you live longer.   -Please contact us before stopping any of your heart medications.  Call the clinic at [phone] with questions or to reschedule future appointments.

## 2020-02-03 ENCOUNTER — Institutional Professional Consult (permissible substitution): Payer: Medicare HMO | Admitting: Internal Medicine

## 2020-02-08 ENCOUNTER — Other Ambulatory Visit (HOSPITAL_COMMUNITY): Payer: Self-pay

## 2020-02-08 ENCOUNTER — Telehealth (HOSPITAL_COMMUNITY): Payer: Self-pay

## 2020-02-08 NOTE — Telephone Encounter (Signed)
Received call from [facility] of paramedicine. She reports that patients weight is up 8lbs since yesterday.  Pt has been very non compliant with diet.  Pt has been taking all meds. No other complaints endorsed. D/w NP Amy, pt to take 80mg  Lasix x2 days then back down to 40mg  daily.   Pt scheduled for labs on 2/23.

## 2020-02-08 NOTE — Progress Notes (Signed)
Paramedicine Encounter    Patient ID: Trevor Ledger., male    DOB: July 18, 1941, 79 y.o.   MRN: [id number]   Patient Care Team: Marletta Lor, NP as PCP - General (Nurse Practitioner)  Patient Active Problem List   Diagnosis Date Noted  . Acute systolic HF (heart failure) (HCC) 06/26/2019  . 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 Type II or unspecified type diabetes mellitus without mention of complication, not stated as uncontrolled 02/22/2013  . CHF (congestive heart failure) (HCC) 02/20/2013  . Hyperglycemia 02/20/2013  . Essential hypertension, benign 02/20/2013  . Other and unspecified hyperlipidemia 02/20/2013    Current Outpatient Medications:  .  aspirin 81 MG EC tablet, Take 81 mg by mouth daily. , Disp: , Rfl:  .  carvedilol (COREG) 3.125 MG tablet, TAKE 1 TABLET (3.125 MG TOTAL) BY MOUTH 2 (TWO) TIMES DAILY WITH A MEAL., Disp: 180 tablet, Rfl: 2 .  colchicine 0.6 MG tablet, Take 0.6 mg by mouth daily., Disp: , Rfl:  .  furosemide (LASIX) 40 MG tablet, Take 1 tablet (40 mg total) by mouth daily as needed., Disp: 60 tablet, Rfl: 5 .  metFORMIN (GLUCOPHAGE) 500 MG tablet, Take 1 tablet (500 mg total) by mouth 2 (two) times daily with a meal., Disp: 60 tablet, Rfl: 0 .  sacubitril-valsartan (ENTRESTO) 24-26 MG, Take 1 tablet by mouth 2 (two) times daily., Disp: 60 tablet, Rfl: 11 .  simvastatin (ZOCOR) 40 MG tablet, Take 40 mg by mouth at bedtime. , Disp: , Rfl:  .  spironolactone (ALDACTONE) 25 MG tablet, Take 0.5 tablets (12.5 mg total) by mouth daily., Disp: 15 tablet, Rfl: 11 .  tamsulosin (FLOMAX) 0.4 MG CAPS capsule, Take 0.4 mg by mouth daily., Disp: , Rfl:  .  Blood Glucose Monitoring Suppl (RELION PRIME MONITOR) DEVI, , Disp: , Rfl:  .  RELION PRIME TEST test strip, , Disp: , Rfl:  .  RELION ULTRA THIN PLUS LANCETS MISC, , Disp: , Rfl:  Allergies  Allergen Reactions  . Ibuprofen Nausea Only      Social History    Socioeconomic History  . Marital status: Widowed    Spouse name: Not on file  . Number of children: Not on file  . Years of education: Not on file  . Highest education level: Not on file  Occupational History  . Not on file  Tobacco Use  . Smoking status: Former Smoker    Packs/day: 0.50    Types: Cigarettes    Quit date: 03/07/1993    Years since quitting: 26.9  . Smokeless tobacco: Never Used  Substance and Sexual Activity  . Alcohol use: No  . Drug use: No  . Sexual activity: Not on file  Other Topics Concern  . Not on file  Social History Narrative  . Not on file   Social Determinants of Health   Financial Resource Strain:   . Difficulty of Paying Living Expenses: Not on file  Food Insecurity:   . Worried About Programme researcher, broadcasting/film/video in the Last Year: Not on file  . Ran Out of Food in the Last Year: Not on file  Transportation Needs:   . Lack of Transportation (Medical): Not on file  . Lack of Transportation (Non-Medical): Not on file  Physical Activity:   . Days of Exercise per Week: Not on file  . Minutes of Exercise per Session: Not on file  Stress:   . Feeling of  Stress : Not on file  Social Connections:   . Frequency of Communication with Friends and Family: Not on file  . Frequency of Social Gatherings with Friends and Family: Not on file  . Attends Religious Services: Not on file  . Active Member of Clubs or Organizations: Not on file  . Attends Archivist Meetings: Not on file  . Marital Status: Not on file  Intimate Partner Violence:   . Fear of Current or Ex-Partner: Not on file  . Emotionally Abused: Not on file  . Physically Abused: Not on file  . Sexually Abused: Not on file    Physical Exam      Future Appointments  Date Time Provider [place]  02/13/2020 11:30 AM Gusler, Christin Z, NP ACP-ACP None  02/14/2020 12:00 PM [facility] LAB [facility] None  02/21/2020 11:30 AM [facility] PA/NP [facility] None  03/19/2020 11:00 AM  Bensimhon, Shaune Pascal, MD [facility] None    BP 108/70   Pulse 86   Temp 97.7 F (36.5 C)   Resp 16   Wt 217 lb (98.4 kg)   SpO2 98%   BMI 35.02 kg/m   Weight yesterday-209 Last visit weight-218 @ clinic  He reports he is doing better than he felt this morning, he states he has had many BM today.  He feels better though. No diarrhea/n/v.  His weight is up 8lbs from yesterday. He had mcdonalds last night for dinner, he ate bojangles for breakfast yesterday and taco bell today. Reviewed better food choices and options. I think him living alone and the bachelor life has him harder to cook for himself and its easier to just go out, but I encouraged him to cook more and also what to cook.  He does eat more fast food than he admits too.  He had a nurse come out Monday and wrapped his legs, but he felt they were too tight so he took them off. He will probably be more compliant with compression stockings.  I told him to get his nurse to measure his legs and get that sent in to PCP to order from pharmacy.  Per amy he needs to increase his lasix to 80mg  daily for 2 days and then resume the 40mg  daily and he goes back next week for labs.    Marylouise Stacks, [facility] Paramedic  02/08/20

## 2020-02-09 ENCOUNTER — Other Ambulatory Visit (HOSPITAL_COMMUNITY): Payer: Medicare HMO

## 2020-02-09 ENCOUNTER — Other Ambulatory Visit: Payer: Medicare HMO | Admitting: Nurse Practitioner

## 2020-02-13 ENCOUNTER — Encounter: Payer: Self-pay | Admitting: Nurse Practitioner

## 2020-02-13 ENCOUNTER — Other Ambulatory Visit: Payer: Self-pay

## 2020-02-13 ENCOUNTER — Other Ambulatory Visit: Payer: Medicare HMO | Admitting: Nurse Practitioner

## 2020-02-13 DIAGNOSIS — I509 Heart failure, unspecified: Secondary | ICD-10-CM

## 2020-02-13 DIAGNOSIS — Z515 Encounter for palliative care: Secondary | ICD-10-CM

## 2020-02-13 NOTE — Progress Notes (Signed)
[place] Consult Note Telephone: [phone]  Fax: [phone]  PATIENT NAME: Trevor Rodriguez. DOB: 29-Apr-1941 MRN: [id number]  PRIMARY CARE PROVIDER:  Alvester Chou NP REFERRING PROVIDER:  Alvester Chou, NP RESPONSIBLE PARTY:   Self; Daughter Trevor Rodriguez [phone]  Due to the COVID-19 crisis, this visit was done via telemedicine from my office and it was initiated and consent by this patient and or family.  RECOMMENDATIONS and PLAN:  1. ACP: full code,  blank MOST form and [place] Choice book to review and revisit at next [facility] visit  2. Palliative care encounter Palliative medicine team will continue to support patient, patient's family, and medical team. Visit consisted of counseling and education dealing with the complex and emotionally intense issues of symptom management and palliative care in the setting of serious and potentially life-threatening illness  I spent 45 minutes providing this consultation,  from 11:30am  to 12:15pm. More than 50% of the time in this consultation was spent coordinating communication.   HISTORY OF PRESENT ILLNESS:  Trevor Pavek. is a 79 y.o. 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 I called Trevor Rodriguez for schedule palliative care follow-up telemedicine is video was not available visit. We talked about how he was feeling today. Mr Rodriguez endorses that he is doing better. The swelling has decreased. Trevor Rodriguez endorses that is breathing is better. Mr Rodriguez endorses that the community paramedic has been coming out in addition to [place]. Trevor Rodriguez did talk about his leg wraps. Mr Rodriguez endorses his legs are a little better. Mr Rodriguez endorses no pain currently. We talked about his appetite. Mr Rodriguez endorses that overall he is doing well. We talked  about chronic disease progression of congestive heart failure. We talked about gout. We talked about role palliative care and plan of care. We talked about last Cardiology appointment. We talked about medical goals of care including aggressive versus conservative versus comfort care. Trevor Rodriguez endorses his preference is to focus one Comfort. We talked to that Trevor Rodriguez his daughter. Discussed will follow up with palliative care visit and 1 month if needed or sooner should he declined. Mr Rodriguez in agreement. Appointment schedule. I called Trevor Rodriguez, Trevor Rodriguez daughter. Discussed visit with Trevor Rodriguez said. We talked about update with recurrent episodes of congestive heart failure. We talked about symptoms, appetite. We talked about edema. We talked about medical goals of care. We talked about home health nursing and community paramedic visiting weekly. We talked about caregiver fatigue and coping strategies. We talked about role of palliative care and plan of care. We talked about aggressive versus comfort care. We talked about focus more on comfort though at this time wishes to continue palliative. We talked about upcoming appointment in 4 weeks and Trevor Rodriguez in agreement. We talked about role of palliative care and plan of care. Therapeutic listening and emotional support provided. Questions answered satisfaction. Contact information. Will further discuss with Alvester Chou NP about transition to hospice, though at present time, full code with progressive interventions.   Palliative Care was asked to help to continue to address goals of care.   CODE STATUS: full code  PPS: 50% HOSPICE ELIGIBILITY/DIAGNOSIS: TBD  PAST MEDICAL HISTORY:  Past Medical History:  Diagnosis Date  . Congestive heart failure (HCC)    Ejection fraction around 15%. He was recently horpitalized for heart failure and had  a heart catheterization. He had minor luminal irregularities.  . Hyperlipidemia   . Hypertension       SOCIAL HX:  Social History   Tobacco Use  . Smoking status: Former Smoker    Packs/day: 0.50    Types: Cigarettes    Quit date: 03/07/1993    Years since quitting: 26.9  . Smokeless tobacco: Never Used  Substance Use Topics  . Alcohol use: No    ALLERGIES:  Allergies  Allergen Reactions  . Ibuprofen Nausea Only     PERTINENT MEDICATIONS:  Outpatient Encounter Medications as of 02/13/2020  Medication Sig  . aspirin 81 MG EC tablet Take 81 mg by mouth daily.   . Blood Glucose Monitoring Suppl (RELION PRIME MONITOR) DEVI   . carvedilol (COREG) 3.125 MG tablet TAKE 1 TABLET (3.125 MG TOTAL) BY MOUTH 2 (TWO) TIMES DAILY WITH A MEAL.  Marland Kitchen colchicine 0.6 MG tablet Take 0.6 mg by mouth daily.  . furosemide (LASIX) 40 MG tablet Take 1 tablet (40 mg total) by mouth daily as needed.  . metFORMIN (GLUCOPHAGE) 500 MG tablet Take 1 tablet (500 mg total) by mouth 2 (two) times daily with a meal.  . RELION PRIME TEST test strip   . RELION ULTRA THIN PLUS LANCETS MISC   . sacubitril-valsartan (ENTRESTO) 24-26 MG Take 1 tablet by mouth 2 (two) times daily.  . simvastatin (ZOCOR) 40 MG tablet Take 40 mg by mouth at bedtime.   Marland Kitchen spironolactone (ALDACTONE) 25 MG tablet Take 0.5 tablets (12.5 mg total) by mouth daily.  . tamsulosin (FLOMAX) 0.4 MG CAPS capsule Take 0.4 mg by mouth daily.   No facility-administered encounter medications on file as of 02/13/2020.    PHYSICAL EXAM:   Deferred  Trevor Rodriguez Jakera Beaupre, NP

## 2020-02-14 ENCOUNTER — Other Ambulatory Visit (HOSPITAL_COMMUNITY): Payer: Medicare HMO

## 2020-02-15 ENCOUNTER — Other Ambulatory Visit (HOSPITAL_COMMUNITY): Payer: Self-pay

## 2020-02-15 NOTE — Progress Notes (Signed)
Paramedicine Encounter    Patient ID: Trevor Ledger., male    DOB: 01-Sep-1941, 79 y.o.   MRN: [id number]   Patient Care Team: Marletta Lor, NP as PCP - General (Nurse Practitioner)  Patient Active Problem List   Diagnosis Date Noted  . Acute systolic HF (heart failure) (HCC) 06/26/2019  . 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 Type II or unspecified type diabetes mellitus without mention of complication, not stated as uncontrolled 02/22/2013  . CHF (congestive heart failure) (HCC) 02/20/2013  . Hyperglycemia 02/20/2013  . Essential hypertension, benign 02/20/2013  . Other and unspecified hyperlipidemia 02/20/2013    Current Outpatient Medications:  .  aspirin 81 MG EC tablet, Take 81 mg by mouth daily. , Disp: , Rfl:  .  Blood Glucose Monitoring Suppl (RELION PRIME MONITOR) DEVI, , Disp: , Rfl:  .  carvedilol (COREG) 3.125 MG tablet, TAKE 1 TABLET (3.125 MG TOTAL) BY MOUTH 2 (TWO) TIMES DAILY WITH A MEAL., Disp: 180 tablet, Rfl: 2 .  colchicine 0.6 MG tablet, Take 0.6 mg by mouth daily., Disp: , Rfl:  .  furosemide (LASIX) 40 MG tablet, Take 1 tablet (40 mg total) by mouth daily as needed., Disp: 60 tablet, Rfl: 5 .  metFORMIN (GLUCOPHAGE) 500 MG tablet, Take 1 tablet (500 mg total) by mouth 2 (two) times daily with a meal., Disp: 60 tablet, Rfl: 0 .  RELION PRIME TEST test strip, , Disp: , Rfl:  .  RELION ULTRA THIN PLUS LANCETS MISC, , Disp: , Rfl:  .  sacubitril-valsartan (ENTRESTO) 24-26 MG, Take 1 tablet by mouth 2 (two) times daily., Disp: 60 tablet, Rfl: 11 .  simvastatin (ZOCOR) 40 MG tablet, Take 40 mg by mouth at bedtime. , Disp: , Rfl:  .  spironolactone (ALDACTONE) 25 MG tablet, Take 0.5 tablets (12.5 mg total) by mouth daily., Disp: 15 tablet, Rfl: 11 .  tamsulosin (FLOMAX) 0.4 MG CAPS capsule, Take 0.4 mg by mouth daily., Disp: , Rfl:  Allergies  Allergen Reactions  . Ibuprofen Nausea Only      Social History    Socioeconomic History  . Marital status: Widowed    Spouse name: Not on file  . Number of children: Not on file  . Years of education: Not on file  . Highest education level: Not on file  Occupational History  . Not on file  Tobacco Use  . Smoking status: Former Smoker    Packs/day: 0.50    Types: Cigarettes    Quit date: 03/07/1993    Years since quitting: 26.9  . Smokeless tobacco: Never Used  Substance and Sexual Activity  . Alcohol use: No  . Drug use: No  . Sexual activity: Not on file  Other Topics Concern  . Not on file  Social History Narrative  . Not on file   Social Determinants of Health   Financial Resource Strain:   . Difficulty of Paying Living Expenses: Not on file  Food Insecurity:   . Worried About Programme researcher, broadcasting/film/video in the Last Year: Not on file  . Ran Out of Food in the Last Year: Not on file  Transportation Needs:   . Lack of Transportation (Medical): Not on file  . Lack of Transportation (Non-Medical): Not on file  Physical Activity:   . Days of Exercise per Week: Not on file  . Minutes of Exercise per Session: Not on file  Stress:   . Feeling of  Stress : Not on file  Social Connections:   . Frequency of Communication with Friends and Family: Not on file  . Frequency of Social Gatherings with Friends and Family: Not on file  . Attends Religious Services: Not on file  . Active Member of Clubs or Organizations: Not on file  . Attends Archivist Meetings: Not on file  . Marital Status: Not on file  Intimate Partner Violence:   . Fear of Current or Ex-Partner: Not on file  . Emotionally Abused: Not on file  . Physically Abused: Not on file  . Sexually Abused: Not on file    Physical Exam      Future Appointments  Date Time Provider [place]  02/21/2020 11:30 AM [facility] PA/NP [facility] None  02/28/2020  2:00 PM Gusler, Christin Z, NP ACP-ACP None  03/19/2020 11:00 AM Bensimhon, Shaune Pascal, MD [facility] None    BP 126/68    Pulse 80   Temp 97.8 F (36.6 C)   Resp 18   Wt 211 lb (95.7 kg)   SpO2 98%   BMI 34.06 kg/m   Weight yesterday-211 Last visit weight-217  Pt reports he is not feeling well today, he got his first COVID vaccine this morning and feels kind of "blah", low energy.  He has home health nurse that visits and is coming back tomor to wrap his legs. His weight is down from last week.  She is also going to work with him on better meal planning. He is really struggling about what to eat.  He denies increased sob. No dizziness. No c/p.  meds verified and pill box refilled. Will f/u next week.   Reminded him of appoint next week at clinic.   Marylouise Stacks, [facility] Paramedic  02/15/20

## 2020-02-21 ENCOUNTER — Other Ambulatory Visit (HOSPITAL_COMMUNITY): Payer: Self-pay

## 2020-02-21 ENCOUNTER — Encounter (HOSPITAL_COMMUNITY): Payer: Self-pay

## 2020-02-21 ENCOUNTER — Ambulatory Visit (HOSPITAL_COMMUNITY)
Admission: RE | Admit: 2020-02-21 | Discharge: 2020-02-21 | Disposition: A | Discharge status: Home / Self Care | Payer: Medicare HMO | Source: Ambulatory Visit | Attending: Cardiology | Admitting: Cardiology

## 2020-02-21 ENCOUNTER — Other Ambulatory Visit: Payer: Self-pay

## 2020-02-21 ENCOUNTER — Other Ambulatory Visit: Payer: Medicare HMO | Admitting: Nurse Practitioner

## 2020-02-21 VITALS — BP 102/58 | HR 100 | Wt 213.0 lb

## 2020-02-21 DIAGNOSIS — I110 Hypertensive heart disease with heart failure: Secondary | ICD-10-CM | POA: Insufficient documentation

## 2020-02-21 DIAGNOSIS — E119 Type 2 diabetes mellitus without complications: Secondary | ICD-10-CM | POA: Diagnosis not present

## 2020-02-21 DIAGNOSIS — Z7984 Long term (current) use of oral hypoglycemic drugs: Secondary | ICD-10-CM | POA: Insufficient documentation

## 2020-02-21 DIAGNOSIS — Z8249 Family history of ischemic heart disease and other diseases of the circulatory system: Secondary | ICD-10-CM | POA: Diagnosis not present

## 2020-02-21 DIAGNOSIS — I2510 Atherosclerotic heart disease of native coronary artery without angina pectoris: Secondary | ICD-10-CM | POA: Diagnosis not present

## 2020-02-21 DIAGNOSIS — I428 Other cardiomyopathies: Secondary | ICD-10-CM | POA: Diagnosis not present

## 2020-02-21 DIAGNOSIS — Z886 Allergy status to analgesic agent status: Secondary | ICD-10-CM | POA: Insufficient documentation

## 2020-02-21 DIAGNOSIS — I5022 Chronic systolic (congestive) heart failure: Secondary | ICD-10-CM | POA: Insufficient documentation

## 2020-02-21 DIAGNOSIS — E785 Hyperlipidemia, unspecified: Secondary | ICD-10-CM | POA: Diagnosis not present

## 2020-02-21 DIAGNOSIS — Z7982 Long term (current) use of aspirin: Secondary | ICD-10-CM | POA: Insufficient documentation

## 2020-02-21 DIAGNOSIS — Z79899 Other long term (current) drug therapy: Secondary | ICD-10-CM | POA: Diagnosis not present

## 2020-02-21 DIAGNOSIS — Z87891 Personal history of nicotine dependence: Secondary | ICD-10-CM | POA: Insufficient documentation

## 2020-02-21 LAB — BASIC METABOLIC PANEL
Anion gap: 13 (ref 5–15)
BUN: 15 mg/dL (ref 8–23)
CO2: 26 mmol/L (ref 22–32)
Calcium: 8.7 mg/dL — ABNORMAL LOW (ref 8.9–10.3)
Chloride: 101 mmol/L (ref 98–111)
Creatinine, Ser: 1.15 mg/dL (ref 0.61–1.24)
GFR calc Af Amer: >60 mL/min (ref >60)
GFR calc non Af Amer: >60 mL/min (ref >60)
Glucose, Bld: 94 mg/dL (ref 70–99)
Potassium: 3.1 mmol/L — ABNORMAL LOW (ref 3.5–5.1)
Sodium: 140 mmol/L (ref 135–145)

## 2020-02-21 MED ORDER — SPIRONOLACTONE 25 MG PO TABS: 25.0000 mg | ORAL_TABLET | Freq: Every day | ORAL | 11 refills | Status: AC

## 2020-02-21 NOTE — Patient Instructions (Addendum)
Lab work done today. We will notify you of any abnormal lab work. No news is good news!  Your home health will draw your lab work next weeks.  INCREASE Spironolactone to 25mg  (1 whole tab) daily.  Please keep follow up appointment 03/19/20.  At the [facility], you and your health needs are our priority. As part of our continuing mission to provide you with exceptional heart care, we have created designated Provider Care Teams. These Care Teams include your primary Cardiologist (physician) and Advanced Practice Providers (APPs- Physician Assistants and Nurse Practitioners) who all work together to provide you with the care you need, when you need it.   You may see any of the following providers on your designated Care Team at your next follow up: 03/21/20 Dr Marland Kitchen . Dr Arvilla Meres . Marca Ancona, NP . Tonye Becket, PA . Robbie Lis, PharmD   Please be sure to bring in all your medications bottles to every appointment.

## 2020-02-21 NOTE — Progress Notes (Signed)
Paramedicine Encounter   Patient ID: [name] , male,   DOB: 11-Feb-1941,79 y.o.,  MRN: [id number]   Met patient in clinic today with provider.  B/p-102/58 p-100 sp02-98 Weight @ clinic-213 lb  Pt reports he is doing ok, he needs to f/u with dr taylor regarding ICD should he decide to move forward with it.   Nurse has been coming out to wrap his legs.  Labs will be done today, spiro will be increased to full tablet.  Home health nurse will be out next week and will draw labs. Will f/u tomor and go out to place pills in pill box.   Marylouise Stacks, [place] 02/21/2020

## 2020-02-21 NOTE — Progress Notes (Signed)
[facility] Note   PCP: Alvester Chou PCP-Cardiologist: Dr Haroldine Laws   Reason for Visit: F/u for Chronic Systolic Heart Failure   HPI: Trevor Rodriguez is a 79 y.o. 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 RV systolic function severely reduced. PA pk pressure 49 mmHg.  Cardiac cath with minimal irregularities.  Echo 2014 with EF 25-30%  LV moderately dilated, trivial AR, mild to mod MR, RA mildly dilated, PA pk pressure 39 mmHg   Had been lost to f/u since 2014 and admitted to [facility] in 06/26/19 with CP and HF. Trop negative, An ECHO was completed and showed EF 25-30% moderate MR which was similar to previous ECHO in 2014. Diuresed with  IV lasix and transitioned to lasix 40 mg daily. HF medications adjusted.   Cardiac MRI 12/20 showed severe LV dilation with severely reduced systolic function. LVEF 15%. No amyloid. Referral placed to EP for ICD. He had virtual visit w/ Dr. Lovena Le on 1/26. ICD recommended however, Pt is currently undecided regarding implant. He will consider and f/u again with EP if he decides to proceed.   He presents to clinic today for f/u. Over the last several weeks, he has required reduction in his diuretics and Entresto dose due to issues w/ low BP. Was previously on bid torsemide and eventually weaned to once daily. He now appears to be on Lasix 40 mg qd. Entesto dose also weaned down to 24-26 bid due to hypotension. At last visit, his weight had increased and he had more LEE. Low dose spironolactone was added. There was also concern regarding medication compliance and he was set up to receive help from paramedicine. He is now followed by Jo[facility]. He is also being followed by Home Health. They have been assisting w/ unna boots to help w/ edema.  He presents back today for f/u. Here w/ Katie. Doing better. BP  stable. No hypotension. Denies dizziness/ syncope/ near syncope. No resting dyspnea, orthopnea or PND but continues with mild dyspnea w/ moderate activity. Continues w/ LEE. Unna boots well applied. Med compliance is good. Remains undecided regarding ICD.     Past Medical History:  Diagnosis Date  . Congestive heart failure (HCC)    Ejection fraction around 15%. He was recently horpitalized for heart failure and had a heart catheterization. He had minor luminal irregularities.  . Hyperlipidemia   . Hypertension     Current Outpatient Medications  Medication Sig Dispense Refill  . aspirin 81 MG EC tablet Take 81 mg by mouth daily.     . Blood Glucose Monitoring Suppl ([place]) DEVI     . carvedilol (COREG) 3.125 MG tablet TAKE 1 TABLET (3.125 MG TOTAL) BY MOUTH 2 (TWO) TIMES DAILY WITH A MEAL. 180 tablet 2  . colchicine 0.6 MG tablet Take 0.6 mg by mouth daily.    . furosemide (LASIX) 40 MG tablet Take 1 tablet (40 mg total) by mouth daily as needed. 60 tablet 5  . metFORMIN (GLUCOPHAGE) 500 MG tablet Take 1 tablet (500 mg total) by mouth 2 (two) times daily with a meal. 60 tablet 0  . RELION PRIME TEST test strip     . RELION ULTRA THIN PLUS LANCETS MISC     . sacubitril-valsartan (ENTRESTO) 24-26 MG Take 1 tablet by mouth 2 (two) times  daily. 60 tablet 11  . simvastatin (ZOCOR) 40 MG tablet Take 40 mg by mouth at bedtime.     Marland Kitchen spironolactone (ALDACTONE) 25 MG tablet Take 0.5 tablets (12.5 mg total) by mouth daily. 15 tablet 11  . tamsulosin (FLOMAX) 0.4 MG CAPS capsule Take 0.4 mg by mouth daily.     No current facility-administered medications for this encounter.    Allergies  Allergen Reactions  . Ibuprofen Nausea Only      Social History   Socioeconomic History  . Marital status: Widowed    Spouse name: Not on file  . Number of children: Not on file  . Years of education: Not on file  . Highest education level: Not on file  Occupational History  . Not on  file  Tobacco Use  . Smoking status: Former Smoker    Packs/day: 0.50    Types: Cigarettes    Quit date: 03/07/1993    Years since quitting: 26.9  . Smokeless tobacco: Never Used  Substance and Sexual Activity  . Alcohol use: No  . Drug use: No  . Sexual activity: Not on file  Other Topics Concern  . Not on file  Social History Narrative  . Not on file   Social Determinants of Health   Financial Resource Strain:   . Difficulty of Paying Living Expenses: Not on file  Food Insecurity:   . Worried About Programme researcher, broadcasting/film/video in the Last Year: Not on file  . Ran Out of Food in the Last Year: Not on file  Transportation Needs:   . Lack of Transportation (Medical): Not on file  . Lack of Transportation (Non-Medical): Not on file  Physical Activity:   . Days of Exercise per Week: Not on file  . Minutes of Exercise per Session: Not on file  Stress:   . Feeling of Stress : Not on file  Social Connections:   . Frequency of Communication with Friends and Family: Not on file  . Frequency of Social Gatherings with Friends and Family: Not on file  . Attends Religious Services: Not on file  . Active Member of Clubs or Organizations: Not on file  . Attends Banker Meetings: Not on file  . Marital Status: Not on file  Intimate Partner Violence:   . Fear of Current or Ex-Partner: Not on file  . Emotionally Abused: Not on file  . Physically Abused: Not on file  . Sexually Abused: Not on file      Family History  Problem Relation Age of Onset  . Cancer Mother   . Heart disease Father     Vitals:   02/21/20 1130  BP: (!) 102/58  Pulse: 100  SpO2: 98%  Weight: 96.6 kg (213 lb)   Wt Readings from Last 3 Encounters:  02/21/20 96.6 kg (213 lb)  02/15/20 95.7 kg (211 lb)  02/08/20 98.4 kg (217 lb)    PHYSICAL EXAM: General:  Well appearing elderly AAM. No respiratory difficulty HEENT: normal Neck: supple. no JVD. Carotids 2+ bilat; no bruits. No lymphadenopathy or  thyromegaly appreciated. Cor: PMI nondisplaced. Regular rate & rhythm. No rubs, gallops or murmurs. Lungs: clear Abdomen: soft, nontender, nondistended. No hepatosplenomegaly. No bruits or masses. Good bowel sounds. Extremities: no cyanosis, clubbing, rash, 1+ bilateral LEE edema, + unna boots  Neuro: alert & oriented x 3, cranial nerves grossly intact. moves all 4 extremities w/o difficulty. Affect pleasant.   ASSESSMENT & PLAN: 1. Chronic Systolic Heart Failure  -  due to NICM echo 2011 EF 10-15%. Cath 2011 minimal CAD - Echo 2014 EF 25-30% - ECHO 06/2019 EF 25-30% mod MR.  - cMRI 12/20 showed severe LV dilation with severely reduced systolic function. LVEF = 15%. No amyloid.  - NYHA II-III. Volume up slightly. Wt up 3 lb and continues w/ 1+ Edema on exam.  - Continue Entresto 24-26 bid (previously did not tolerate higher doses due to orthostatic hypotension) - Increase Spironolactone to 25 mg daily  - Continue Lasix 40 mg qd - Check BMP today and again in 7 days by home health   - Continue bilateral Unna boots  - Discussed low sodium diet - Consider addition of Jardiance at next f/u visit (most recent BMP 1/20 showed GFR >60).  - Continue carvedilol 3.125 bid - Continue w/ paramedicine - he remains undecided regarding ICD. He will f/u w/ EP if he decides to proceed.   2. HTN  - BP a bit soft but stable. No orthostatic symptoms.  - Mildly volume overloaded. Will increase Spironolactone to 25 mg daily - check BMP today and in 7 days   3. DM2 - On metformin.  - consider addition of a SGLT2i in the near future    F/u w/ 2-3 weeks to reassess volume status and for further med titration of HF regimen, if BP allows.   Robbie Lis, PA-C 02/21/20

## 2020-02-22 ENCOUNTER — Other Ambulatory Visit (HOSPITAL_COMMUNITY): Payer: Self-pay

## 2020-02-22 NOTE — Progress Notes (Signed)
Came out today post clinic visit yesterday to fill pill box.  He missed a couple doses of meds this week.  Pill box filled.  Will f/u next week.   Kerry Hough, EMT-Paramedic  02/22/20

## 2020-02-28 ENCOUNTER — Other Ambulatory Visit: Payer: Medicare HMO | Admitting: Nurse Practitioner

## 2020-02-28 ENCOUNTER — Encounter: Payer: Self-pay | Admitting: Nurse Practitioner

## 2020-02-28 ENCOUNTER — Other Ambulatory Visit: Payer: Self-pay

## 2020-02-28 DIAGNOSIS — Z515 Encounter for palliative care: Secondary | ICD-10-CM

## 2020-02-28 DIAGNOSIS — I509 Heart failure, unspecified: Secondary | ICD-10-CM

## 2020-02-28 NOTE — Progress Notes (Addendum)
[facility] Consult Note Telephone: [phone]  Fax: [phone]  PATIENT NAME: Trevor Rodriguez. DOB: 1941/10/10 MRN: [id number]  PRIMARY CARE PROVIDER:   Alvester Chou, NP  REFERRING PROVIDER:  Alvester Chou, NP [id number] [place],  [place] [address] RESPONSIBLE PARTY:Self; Daughter [name] [phone] Face to face visit RECOMMENDATIONS and PLAN: 1.ACP: DNR, placed in Vynca/Epic; agreeable to hospice services; Alvester Chou NP to give VO, notified hospice  2.Palliative care encounter Palliative medicine team will continue to support patient, patient's family, and medical team. Visit consisted of counseling and education dealing with the complex and emotionally intense issues of symptom management and palliative care in the setting of serious and potentially life-threatening illness  I spent 79 minutes providing this consultation,  from 3:00pm to 4:10pm. More than 50% of the time in this consultation was spent coordinating communication.   HISTORY OF PRESENT ILLNESS:  Trevor Rodriguez. is a 79 y.o. 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 I met with Alvester Chou NP primary provider, Glade Lloyd nurse practitioner student, Mr. Delrossi, [name] his daughter for follow-up palliative care visit. We talked about how Mr Elk has been feeling. We talked about chronic disease progression of congestive heart failure. We talked about symptoms of shortness of breath and edema. We talked about wrapping his legs including the blisters on his left leg. We talked about medical goals of care. We talked about internal defibrillator and Mr. Hearn endorses that is not something he was interested in. We talked about code status. Mr Wernick endorses his wishes are to be a DNR. [employer] forms completed, placed in  Epic/Vynca. We talked about quality of life. Mr. Vanderwerf talked about being more dyspneic which continues to interfere with his daily functioning. Mr. Oboyle has times where he is confused. Mr. Ahles is sleeping a lot, throughout the day even. We talked about Hospice Services as a Medicare benefit. Reviewed Services provided. [name] and Mr. Friedmann both in agreement in addition to Alvester Chou NP. Order sent for [facility]. Will continue [name]. Therapeutic listening and emotional support provided. Contact information. Questions answered to satisfaction.  Palliative care was asked to help to continue to address goals of care.   CODE STATUS: DNR  PPS: 40% HOSPICE ELIGIBILITY/DIAGNOSIS: yes; reviewed by Hospice Physicians/CHF  PAST MEDICAL HISTORY:  Past Medical History:  Diagnosis Date  . Congestive heart failure (HCC)    Ejection fraction around 15%. He was recently horpitalized for heart failure and had a heart catheterization. He had minor luminal irregularities.  . Hyperlipidemia   . Hypertension     SOCIAL HX:  Social History   Tobacco Use  . Smoking status: Former Smoker    Packs/day: 0.50    Types: Cigarettes    Quit date: 03/07/1993    Years since quitting: 26.9  . Smokeless tobacco: Never Used  Substance Use Topics  . Alcohol use: No    ALLERGIES:  Allergies  Allergen Reactions  . Ibuprofen Nausea Only     PERTINENT MEDICATIONS:  Outpatient Encounter Medications as of 02/28/2020  Medication Sig  . aspirin 81 MG EC tablet Take 81 mg by mouth daily.   . Blood Glucose Monitoring Suppl ([place]) DEVI   . carvedilol (COREG) 3.125 MG tablet TAKE 1 TABLET (3.125 MG TOTAL) BY MOUTH 2 (TWO) TIMES DAILY WITH A MEAL.  Marland Kitchen colchicine  0.6 MG tablet Take 0.6 mg by mouth daily.  . furosemide (LASIX) 40 MG tablet Take 1 tablet (40 mg total) by mouth daily as needed.  . metFORMIN (GLUCOPHAGE) 500 MG tablet Take 1 tablet (500 mg total) by  mouth 2 (two) times daily with a meal.  . RELION PRIME TEST test strip   . RELION ULTRA THIN PLUS LANCETS MISC   . sacubitril-valsartan (ENTRESTO) 24-26 MG Take 1 tablet by mouth 2 (two) times daily.  . simvastatin (ZOCOR) 40 MG tablet Take 40 mg by mouth at bedtime.   Marland Kitchen spironolactone (ALDACTONE) 25 MG tablet Take 1 tablet (25 mg total) by mouth daily.  . tamsulosin (FLOMAX) 0.4 MG CAPS capsule Take 0.4 mg by mouth daily.   No facility-administered encounter medications on file as of 02/28/2020.    PHYSICAL EXAM:   General: chronically ill, pleasant male Neurological: generalized weakness   Z , NP

## 2020-02-29 ENCOUNTER — Other Ambulatory Visit (HOSPITAL_COMMUNITY): Payer: Self-pay

## 2020-02-29 NOTE — Progress Notes (Signed)
Paramedicine Encounter    Patient ID: Trevor Brace., male    DOB: 12-17-1941, 79 y.o.   MRN: [id number]   Patient Care Team: Alvester Chou, NP as PCP - General (Nurse Practitioner)  Patient Active Problem List   Diagnosis Date Noted  . Acute systolic HF (heart failure) ([place]) 06/26/2019  . 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 Type II or unspecified type diabetes mellitus without mention of complication, not stated as uncontrolled 02/22/2013  . CHF (congestive heart failure) ([place]) 02/20/2013  . Hyperglycemia 02/20/2013  . Essential hypertension, benign 02/20/2013  . Other and unspecified hyperlipidemia 02/20/2013    Current Outpatient Medications:  .  aspirin 81 MG EC tablet, Take 81 mg by mouth daily. , Disp: , Rfl:  .  Blood Glucose Monitoring Suppl (RELION PRIME MONITOR) DEVI, , Disp: , Rfl:  .  carvedilol (COREG) 3.125 MG tablet, TAKE 1 TABLET (3.125 MG TOTAL) BY MOUTH 2 (TWO) TIMES DAILY WITH A MEAL., Disp: 180 tablet, Rfl: 2 .  colchicine 0.6 MG tablet, Take 0.6 mg by mouth daily., Disp: , Rfl:  .  furosemide (LASIX) 40 MG tablet, Take 1 tablet (40 mg total) by mouth daily as needed., Disp: 60 tablet, Rfl: 5 .  metFORMIN (GLUCOPHAGE) 500 MG tablet, Take 1 tablet (500 mg total) by mouth 2 (two) times daily with a meal., Disp: 60 tablet, Rfl: 0 .  QUEtiapine (SEROQUEL) 25 MG tablet, Take 25 mg by mouth at bedtime., Disp: , Rfl:  .  sacubitril-valsartan (ENTRESTO) 24-26 MG, Take 1 tablet by mouth 2 (two) times daily., Disp: 60 tablet, Rfl: 11 .  simvastatin (ZOCOR) 40 MG tablet, Take 40 mg by mouth at bedtime. , Disp: , Rfl:  .  spironolactone (ALDACTONE) 25 MG tablet, Take 1 tablet (25 mg total) by mouth daily., Disp: 30 tablet, Rfl: 11 .  tamsulosin (FLOMAX) 0.4 MG CAPS capsule, Take 0.4 mg by mouth at bedtime. , Disp: , Rfl:  .  RELION PRIME TEST test strip, , Disp: , Rfl:  .  RELION ULTRA THIN PLUS LANCETS MISC, , Disp: , Rfl:   Allergies  Allergen Reactions  . Ibuprofen Nausea Only      Social History   Socioeconomic History  . Marital status: Widowed    Spouse name: Not on file  . Number of children: Not on file  . Years of education: Not on file  . Highest education level: Not on file  Occupational History  . Not on file  Tobacco Use  . Smoking status: Former Smoker    Packs/day: 0.50    Types: Cigarettes    Quit date: 03/07/1993    Years since quitting: 27.0  . Smokeless tobacco: Never Used  Substance and Sexual Activity  . Alcohol use: No  . Drug use: No  . Sexual activity: Not on file  Other Topics Concern  . Not on file  Social History Narrative  . Not on file   Social Determinants of Health   Financial Resource Strain:   . Difficulty of Paying Living Expenses: Not on file  Food Insecurity:   . Worried About Charity fundraiser in the Last Year: Not on file  . Ran Out of Food in the Last Year: Not on file  Transportation Needs:   . Lack of Transportation (Medical): Not on file  . Lack of Transportation (Non-Medical): Not on file  Physical Activity:   . Days of Exercise per Week:  Not on file  . Minutes of Exercise per Session: Not on file  Stress:   . Feeling of Stress : Not on file  Social Connections:   . Frequency of Communication with Friends and Family: Not on file  . Frequency of Social Gatherings with Friends and Family: Not on file  . Attends Religious Services: Not on file  . Active Member of Clubs or Organizations: Not on file  . Attends Banker Meetings: Not on file  . Marital Status: Not on file  Intimate Partner Violence:   . Fear of Current or Ex-Partner: Not on file  . Emotionally Abused: Not on file  . Physically Abused: Not on file  . Sexually Abused: Not on file    Physical Exam      Future Appointments  Date Time Provider Department Center  03/19/2020 11:00 AM Bensimhon, Bevelyn Buckles, MD [facility] None    BP 102/64   Pulse 82   Resp 16    Wt 214 lb (97.1 kg)   SpO2 98%   BMI 34.54 kg/m  CBG EMS-97  Weight yesterday-213 Last visit weight-213 @ clinic  Pt had visit with PCP yesterday, family and palliative care nurse in the home yesterday. He has decided to not pursue ICD at this time and he was referred from palliative care to hospice care. He will continue to stay on HF meds at this time. He has his legs wrapped at this time due to swelling and now he has a blister on his left lower leg that is being tended to.  Nurse is suppose to come back out on Thursday evening to re-wrap his legs. He does feel sob when his legs are swollen. He denies eating anything bad that would cause his swelling to increase.  He denies missing any doses of his meds.  meds verified and pill box refilled.  Pt denies any dizziness, no c/p.  Will f/u next week.   Kerry Hough, EMT-Paramedic [phone] [facility] Paramedic  02/29/20

## 2020-03-05 ENCOUNTER — Telehealth (HOSPITAL_COMMUNITY): Payer: Self-pay

## 2020-03-05 NOTE — Telephone Encounter (Signed)
Received message from his PCP Marletta Lor, that there have been some med changes. They are stopping the entresto and zocor-the hospice nurse is taking them out from his pill box. He is starting losartan 50mg  daily.  He is becoming more tired and sob, hospice is also giving him liquid morphine for his sob so he doesn't have to call 911 and be sent to hosp.  Will plan on seeing him on Wednesday.   Saturday, EMT-Paramedic  03/05/20

## 2020-03-07 ENCOUNTER — Other Ambulatory Visit (HOSPITAL_COMMUNITY): Payer: Self-pay

## 2020-03-07 NOTE — Progress Notes (Signed)
Paramedicine Encounter    Patient ID: Trevor Ledger., male    DOB: 28-Oct-1941, 79 y.o.   MRN: [id number]   Patient Care Team: Marletta Lor, NP as PCP - General (Nurse Practitioner)  Patient Active Problem List   Diagnosis Date Noted  . Acute systolic HF (heart failure) (HCC) 06/26/2019  . 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 Type II or unspecified type diabetes mellitus without mention of complication, not stated as uncontrolled 02/22/2013  . CHF (congestive heart failure) (HCC) 02/20/2013  . Hyperglycemia 02/20/2013  . Essential hypertension, benign 02/20/2013  . Other and unspecified hyperlipidemia 02/20/2013    Current Outpatient Medications:  .  aspirin 81 MG EC tablet, Take 81 mg by mouth daily. , Disp: , Rfl:  .  carvedilol (COREG) 3.125 MG tablet, TAKE 1 TABLET (3.125 MG TOTAL) BY MOUTH 2 (TWO) TIMES DAILY WITH A MEAL., Disp: 180 tablet, Rfl: 2 .  colchicine 0.6 MG tablet, Take 0.6 mg by mouth daily., Disp: , Rfl:  .  furosemide (LASIX) 40 MG tablet, Take 1 tablet (40 mg total) by mouth daily as needed., Disp: 60 tablet, Rfl: 5 .  losartan (COZAAR) 50 MG tablet, Take 50 mg by mouth daily., Disp: , Rfl:  .  metFORMIN (GLUCOPHAGE) 500 MG tablet, Take 1 tablet (500 mg total) by mouth 2 (two) times daily with a meal., Disp: 60 tablet, Rfl: 0 .  QUEtiapine (SEROQUEL) 25 MG tablet, Take 25 mg by mouth at bedtime., Disp: , Rfl:  .  spironolactone (ALDACTONE) 25 MG tablet, Take 1 tablet (25 mg total) by mouth daily., Disp: 30 tablet, Rfl: 11 .  tamsulosin (FLOMAX) 0.4 MG CAPS capsule, Take 0.4 mg by mouth at bedtime. , Disp: , Rfl:  .  Blood Glucose Monitoring Suppl (RELION PRIME MONITOR) DEVI, , Disp: , Rfl:  .  RELION PRIME TEST test strip, , Disp: , Rfl:  .  RELION ULTRA THIN PLUS LANCETS MISC, , Disp: , Rfl:  .  sacubitril-valsartan (ENTRESTO) 24-26 MG, Take 1 tablet by mouth 2 (two) times daily. (Patient not taking: Reported on  03/07/2020), Disp: 60 tablet, Rfl: 11 .  simvastatin (ZOCOR) 40 MG tablet, Take 40 mg by mouth at bedtime. , Disp: , Rfl:  Allergies  Allergen Reactions  . Ibuprofen Nausea Only      Social History   Socioeconomic History  . Marital status: Widowed    Spouse name: Not on file  . Number of children: Not on file  . Years of education: Not on file  . Highest education level: Not on file  Occupational History  . Not on file  Tobacco Use  . Smoking status: Former Smoker    Packs/day: 0.50    Types: Cigarettes    Quit date: 03/07/1993    Years since quitting: 27.0  . Smokeless tobacco: Never Used  Substance and Sexual Activity  . Alcohol use: No  . Drug use: No  . Sexual activity: Not on file  Other Topics Concern  . Not on file  Social History Narrative  . Not on file   Social Determinants of Health   Financial Resource Strain:   . Difficulty of Paying Living Expenses:   Food Insecurity:   . Worried About Programme researcher, broadcasting/film/video in the Last Year:   . Barista in the Last Year:   Transportation Needs:   . Freight forwarder (Medical):   Marland Kitchen Lack of Transportation (  Non-Medical):   Physical Activity:   . Days of Exercise per Week:   . Minutes of Exercise per Session:   Stress:   . Feeling of Stress :   Social Connections:   . Frequency of Communication with Friends and Family:   . Frequency of Social Gatherings with Friends and Family:   . Attends Religious Services:   . Active Member of Clubs or Organizations:   . Attends Archivist Meetings:   Marland Kitchen Marital Status:   Intimate Partner Violence:   . Fear of Current or Ex-Partner:   . Emotionally Abused:   Marland Kitchen Physically Abused:   . Sexually Abused:     Physical Exam      Future Appointments  Date Time Provider [place]  03/19/2020 11:00 AM Bensimhon, Shaune Pascal, MD [facility] None    BP 104/60   Pulse 84   Temp 98 F (36.7 C)   Resp 16   Wt 217 lb (98.4 kg)   SpO2 98%   BMI 35.02  kg/m   Weight yesterday-217 Last visit weight-214  Pt reports he is doing alright, he does get sob when he tries to do too much. He is now on hospice, still plans on going to HF clinic appointment next week.  He was taken off zocor and entresto, replaced entresto with losartan.  Daughter filled his pill box so that was not needed today.  He had question about why he was taken off entresto, he will ask his nurse tomor that coming out to wrap his legs.  He does not have the losartan--called pharmacy to see if they have it ready--losartan 50mg  It is ready and daughter will go by and pick it up.  meds verified and I checked the pill box and it was correct with the exception of missing the losartan. I did tell daughter it needed to be placed in pill box.     Marylouise Stacks, [facility] Paramedic  03/07/20

## 2020-03-14 ENCOUNTER — Other Ambulatory Visit (HOSPITAL_COMMUNITY): Payer: Self-pay

## 2020-03-14 NOTE — Progress Notes (Signed)
Paramedicine Encounter    Patient ID: Trevor Brace., male    DOB: [birth year], 79 y.o.   MRN: [id number]   Patient Care Team: Alvester Chou, NP as PCP - General (Nurse Practitioner)  Patient Active Problem List   Diagnosis Date Noted  . Acute systolic HF (heart failure) ([place]) 06/26/2019  . 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 Type II or unspecified type diabetes mellitus without mention of complication, not stated as uncontrolled 02/22/2013  . CHF (congestive heart failure) ([place]) 02/20/2013  . Hyperglycemia 02/20/2013  . Essential hypertension, benign 02/20/2013  . Other and unspecified hyperlipidemia 02/20/2013    Current Outpatient Medications:  .  aspirin 81 MG EC tablet, Take 81 mg by mouth daily. , Disp: , Rfl:  .  carvedilol (COREG) 3.125 MG tablet, TAKE 1 TABLET (3.125 MG TOTAL) BY MOUTH 2 (TWO) TIMES DAILY WITH A MEAL., Disp: 180 tablet, Rfl: 2 .  colchicine 0.6 MG tablet, Take 0.6 mg by mouth daily., Disp: , Rfl:  .  furosemide (LASIX) 40 MG tablet, Take 1 tablet (40 mg total) by mouth daily as needed., Disp: 60 tablet, Rfl: 5 .  losartan (COZAAR) 50 MG tablet, Take 50 mg by mouth at bedtime. , Disp: , Rfl:  .  metFORMIN (GLUCOPHAGE) 500 MG tablet, Take 1 tablet (500 mg total) by mouth 2 (two) times daily with a meal., Disp: 60 tablet, Rfl: 0 .  QUEtiapine (SEROQUEL) 25 MG tablet, Take 25 mg by mouth at bedtime., Disp: , Rfl:  .  spironolactone (ALDACTONE) 25 MG tablet, Take 1 tablet (25 mg total) by mouth daily., Disp: 30 tablet, Rfl: 11 .  tamsulosin (FLOMAX) 0.4 MG CAPS capsule, Take 0.4 mg by mouth at bedtime. , Disp: , Rfl:  .  Blood Glucose Monitoring Suppl (RELION PRIME MONITOR) DEVI, , Disp: , Rfl:  .  RELION PRIME TEST test strip, , Disp: , Rfl:  .  RELION ULTRA THIN PLUS LANCETS MISC, , Disp: , Rfl:  .  sacubitril-valsartan (ENTRESTO) 24-26 MG, Take 1 tablet by mouth 2 (two) times daily. (Patient not taking:  Reported on 03/07/2020), Disp: 60 tablet, Rfl: 11 .  simvastatin (ZOCOR) 40 MG tablet, Take 40 mg by mouth at bedtime. , Disp: , Rfl:  Allergies  Allergen Reactions  . Ibuprofen Nausea Only      Social History   Socioeconomic History  . Marital status: Widowed    Spouse name: Not on file  . Number of children: Not on file  . Years of education: Not on file  . Highest education level: Not on file  Occupational History  . Not on file  Tobacco Use  . Smoking status: Former Smoker    Packs/day: 0.50    Types: Cigarettes    Quit date: 03/07/1993    Years since quitting: 27.0  . Smokeless tobacco: Never Used  Substance and Sexual Activity  . Alcohol use: No  . Drug use: No  . Sexual activity: Not on file  Other Topics Concern  . Not on file  Social History Narrative  . Not on file   Social Determinants of Health   Financial Resource Strain:   . Difficulty of Paying Living Expenses:   Food Insecurity:   . Worried About Charity fundraiser in the Last Year:   . Arboriculturist in the Last Year:   Transportation Needs:   . Film/video editor (Medical):   Marland Kitchen Lack  of Transportation (Non-Medical):   Physical Activity:   . Days of Exercise per Week:   . Minutes of Exercise per Session:   Stress:   . Feeling of Stress :   Social Connections:   . Frequency of Communication with Friends and Family:   . Frequency of Social Gatherings with Friends and Family:   . Attends Religious Services:   . Active Member of Clubs or Organizations:   . Attends Banker Meetings:   Marland Kitchen Marital Status:   Intimate Partner Violence:   . Fear of Current or Ex-Partner:   . Emotionally Abused:   Marland Kitchen Physically Abused:   . Sexually Abused:     Physical Exam      Future Appointments  Date Time Provider Department Center  03/19/2020 11:00 AM Bensimhon, Bevelyn Buckles, MD [facility] None    BP 102/70   Pulse 88   Temp 98.3 F (36.8 C)   Resp 16   Wt 213 lb (96.6 kg)   SpO2 98%    BMI 34.38 kg/m   Weight yesterday-213 Last visit weight-217  Pt sitting in car when I arrived, pt reports hes doing so-so. Nurse has his legs wrapped. He states he feels tired all the time. His EF is very low. Is on hospice.  His daughter filled pill box last week-only thing missing was the losartan. He has that pill bottle, when I checked his pill box the entresto was back in there-no losartan. I counted the losartan and it was 30 tablets in bottle so nothing was used.  He had a few questions for Raynelle Fanning, his PCP, so we got her on the phone. Spoke with her on phone--they have brought out hospital bed and 02.  Med review with Julie--lasix is as needed, losartan he needs to wait 3 days before he starts the losartan.  Pill box checked and adjusted.  Lasix when needed is the 3lbs overnight or 6lbs in a week. We went over that again and the importance of weighing daily and writing it down.  He has not taken his meds yet this morning, he said he was waiting for me to check his meds to make sure it was right before he took them. Filled back up his pill box. Will f/u next week.    Kerry Hough, EMT-Paramedic [phone] [facility] Paramedic  03/15/20

## 2020-03-17 IMAGING — MR MR CARD MORPHOLOGY WO/W CM
32 of 34 series · 36 of 40 positions shown · IV contrast (gadavist)
Comparison: Echocardiogram 06/27/2019

EXAM:
CARDIAC MRI

CLINICAL DATA: Heart failure known, AYLLON eligible, dyssynchrony
eval, pre-procedure planning
TECHNIQUE: The patient was scanned on a 1.5 Tesla GE magnet. A dedicated
cardiac coil was used. Functional imaging was done using Fiesta
sequences. [DATE], and 4 chamber views were done to assess for RWMA's.
Modified Totonchi rule using a short axis stack was used to
calculate an ejection fraction on a dedicated work station using
Circle software. The patient received 10mL GADAVIST GADOBUTROL 1
MMOL/ML IV SOLN. After 10 minutes inversion recovery sequences were
used to assess for infiltration and scar tissue.

CONTRAST:  10mL GADAVIST GADOBUTROL 1 MMOL/ML IV SOLN

[Series 6: bSSFP · oblique · 8.0mm · 1.61mm/px · 2 of 25 slices shown (1 of 27)]
[im 1/25]
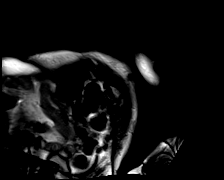
[im 25/25]
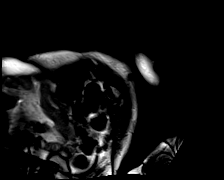

[Series 6: bSSFP · oblique · 8.0mm · 1.61mm/px · 1 of 25 slices shown (2 of 27)]
[im 1/25]
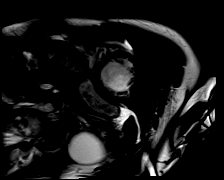

[Series 6: bSSFP · oblique · 8.0mm · 1.61mm/px · 1 of 25 slices shown (3 of 27)]
[im 1/25]
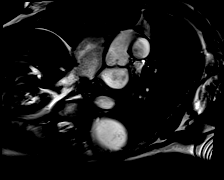

[Series 6: bSSFP · oblique · 8.0mm · 1.61mm/px · 1 of 25 slices shown (4 of 27)]
[im 1/25]
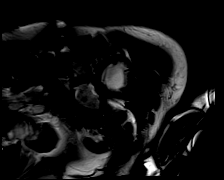

[Series 6: bSSFP · oblique · 8.0mm · 1.61mm/px · 1 of 25 slices shown (5 of 27)]
[im 1/25]
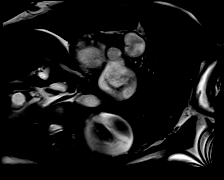

[Series 6: bSSFP · oblique · 8.0mm · 1.61mm/px · 1 of 25 slices shown (6 of 27)]
[im 1/25]
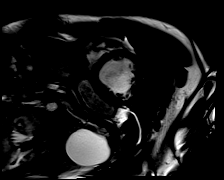

[Series 6: bSSFP · oblique · 8.0mm · 1.61mm/px · 1 of 25 slices shown (7 of 27)]
[im 1/25]
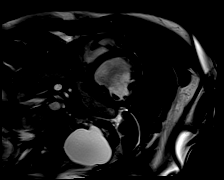

[Series 6: bSSFP · oblique · 8.0mm · 1.61mm/px · 2 of 25 slices shown (8 of 27)]
[im 1/25]
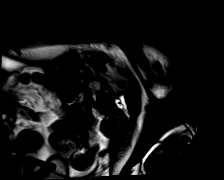
[im 25/25]
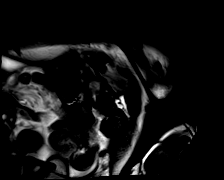

[Series 6: bSSFP · oblique · 8.0mm · 1.61mm/px · 1 of 25 slices shown (9 of 27)]
[im 1/25]
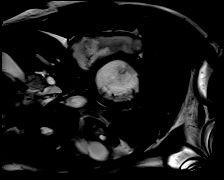

[Series 6: bSSFP · oblique · 8.0mm · 1.61mm/px · 2 of 25 slices shown (10 of 27)]
[im 1/25]
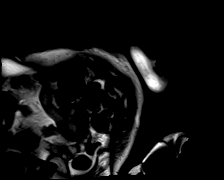
[im 25/25]
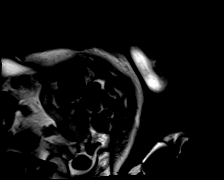

[Series 6: bSSFP · oblique · 8.0mm · 1.61mm/px · 1 of 25 slices shown (11 of 27)]
[im 1/25]
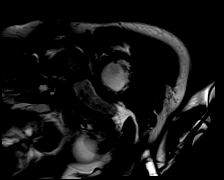

[Series 6: bSSFP · oblique · 8.0mm · 1.61mm/px · 1 of 25 slices shown (12 of 27)]
[im 1/25]
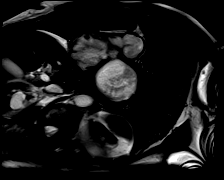

[Series 6: bSSFP · oblique · 8.0mm · 1.61mm/px · 1 of 25 slices shown (13 of 27)]
[im 1/25]
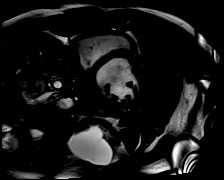

[Series 6: bSSFP · oblique · 8.0mm · 1.61mm/px · 1 of 25 slices shown (14 of 27)]
[im 1/25]
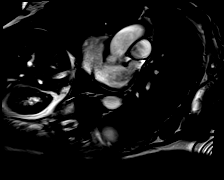

[Series 6: bSSFP · oblique · 8.0mm · 1.61mm/px · 1 of 25 slices shown (15 of 27)]
[im 1/25]
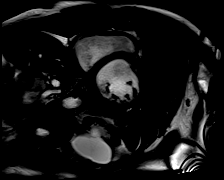

[Series 6: bSSFP · oblique · 8.0mm · 1.61mm/px · 1 of 25 slices shown (16 of 27)]
[im 1/25]
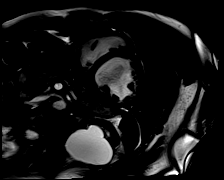

[Series 6: bSSFP · oblique · 8.0mm · 1.61mm/px · 1 of 25 slices shown (17 of 27)]
[im 1/25]
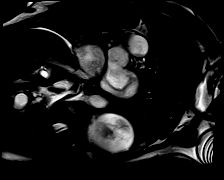

[Series 6: bSSFP · oblique · 8.0mm · 1.61mm/px · 1 of 25 slices shown (18 of 27)]
[im 1/25]
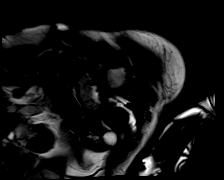

[Series 6: bSSFP · oblique · 8.0mm · 1.61mm/px · 1 of 25 slices shown (19 of 27)]
[im 1/25]
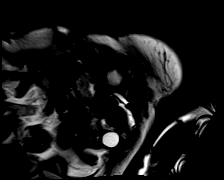

[Series 6: bSSFP · oblique · 8.0mm · 1.61mm/px · 2 of 25 slices shown (20 of 27)]
[im 1/25]
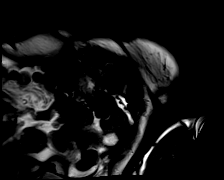
[im 25/25]
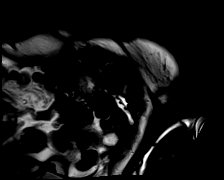

[Series 7: bSSFP · oblique · 8.0mm · 1.61mm/px · 1 of 25 slices shown (21 of 27)]
[im 1/25]
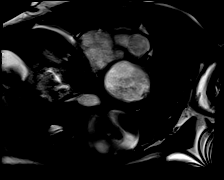

[Series 7: bSSFP · oblique · 8.0mm · 1.61mm/px · 1 of 25 slices shown (22 of 27)]
[im 1/25]
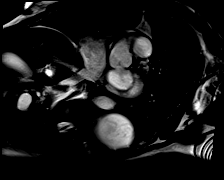

[Series 7: bSSFP · oblique · 8.0mm · 1.61mm/px · 1 of 25 slices shown (23 of 27)]
[im 1/25]
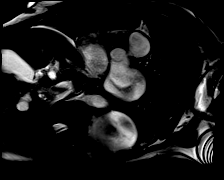

[Series 7: bSSFP · oblique · 8.0mm · 1.61mm/px · 1 of 25 slices shown (24 of 27)]
[im 1/25]
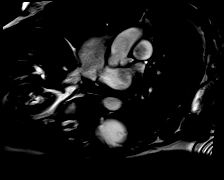

[Series 8: t2_stir_db_sax · oblique · 8.0mm · 1.73mm/px · 1 of 20 slices shown]
[im 1/20]
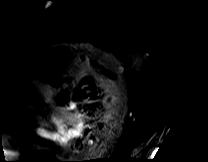

[Series 10: bSSFP · oblique · 6.0mm · 1.41mm/px · 1 of 25 slices shown (25 of 27)]
[im 1/25]
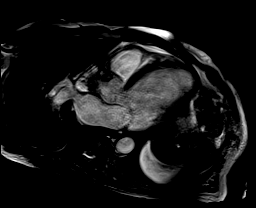

[Series 11: bSSFP · oblique · 6.0mm · 1.41mm/px · 1 of 25 slices shown (26 of 27)]
[im 1/25]
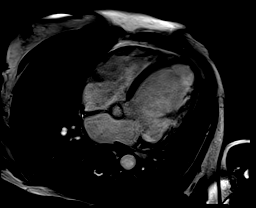

[Series 12: bSSFP · oblique · 6.0mm · 1.41mm/px · 1 of 25 slices shown (27 of 27)]
[im 1/25]
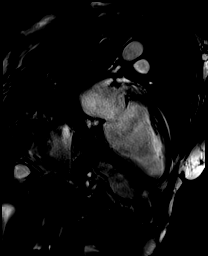

[Series 14: lge_single shot sa · oblique · 8.0mm · 1.98mm/px · 1 of 20 slices shown (1 of 2)]
[im 1/20]
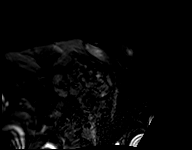

[Series 15: lge_single shot sa · oblique · 8.0mm · 1.98mm/px · 1 of 20 slices shown (2 of 2)]
[im 1/20]
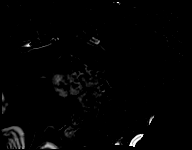

[Series 24: lge short axis_mag · oblique · 8.0mm · 1.61mm/px · 1 of 6 slices shown]
[im 1/6]
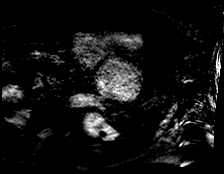

[Series 25: lge short axis_psir · oblique · 8.0mm · 1.61mm/px · 1 of 6 slices shown]
[im 1/6]
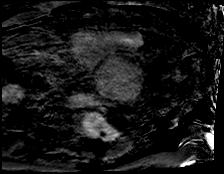

[36 of 40 positions shown; findings below may reference images not displayed]

FINDINGS: LEFT VENTRICLE:

Severely dilated left ventricle, with normal wall thickness.
Severely reduced systolic function by visual assessment and
calculated volumes (LVEF = 15%). Volumetric assessment may be
impacted by motion artifact. Severe global hypokinesis.

Late gadolinium enhancement in the left ventricular myocardium is
seen at the inferior RV insertion point from base to apex. This
finding can be seen in the setting of increased pulmonary pressure.

Normal T1 myocardial nulling kinetics, suggest against a diagnosis
of cardiac amyloidosis.

RIGHT VENTRICLE:

Moderately dilated right ventricle, normal wall thickness. Severely
reduced systolic function (RVEF = 18%). Severe global hypokinesis.

ATRIA:

Severely dilated left atrial chamber size. Mild-moderately dilated
right atrial chamber size.

VALVES:

Mitral regurgitation noted. Valvular lesions challenging to assess
in the setting of motion artifact.

PERICARDIUM:

Normal pericardium.  No pericardial effusion.

OTHER:

Renal cyst like lesions noted. Consider renal ultrasound for further
characterization.

No other significant non-cardiac, incidental findings noted.

MEASUREMENTS:

Motion artifact degrades the diagnostic accuracy of volumetric
assessment.

LVEDV: 332 ml

LVESV: 283 ml

SV: 49 ml

CO: 3.7 L/min

Myocardial mass: 177 g

RVEDV: 185 ml

RVEDS: 152 ml

RVSV: 33 ml
IMPRESSION: Diagnostic accuracy of this exam affected by motion artifact.

1. Severe LV dilation with severely reduced systolic function. LVEF
= 15%

2. Moderately dilated right ventricle with severely reduced systolic
function. RVEF 18%.

3. Delayed myocardial enhancement noted in the inferior RV insertion
point from base to apex. This finding can be seen with increased
pulmonary pressure. No other delayed myocardial enhancement noted.

4. Renal cyst like lesions noted. Consider renal ultrasound for
further characterization.

## 2020-03-19 ENCOUNTER — Encounter (HOSPITAL_COMMUNITY): Payer: Medicare HMO | Admitting: Internal Medicine

## 2020-03-27 ENCOUNTER — Other Ambulatory Visit (HOSPITAL_COMMUNITY): Payer: Self-pay

## 2020-03-27 NOTE — Progress Notes (Signed)
Paramedicine Encounter    Patient ID: Trevor Rodriguez., male    DOB: Aug 16, 1941, 79 y.o.   MRN: [id number]   Patient Care Team: Alvester Chou, NP as PCP - General (Nurse Practitioner)  Patient Active Problem List   Diagnosis Date Noted  . Acute systolic HF (heart failure) ([place]) 06/26/2019  . 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 Type II or unspecified type diabetes mellitus without mention of complication, not stated as uncontrolled 02/22/2013  . CHF (congestive heart failure) ([place]) 02/20/2013  . Hyperglycemia 02/20/2013  . Essential hypertension, benign 02/20/2013  . Other and unspecified hyperlipidemia 02/20/2013    Current Outpatient Medications:  .  aspirin 81 MG EC tablet, Take 81 mg by mouth daily. , Disp: , Rfl:  .  Blood Glucose Monitoring Suppl (RELION PRIME MONITOR) DEVI, , Disp: , Rfl:  .  carvedilol (COREG) 3.125 MG tablet, TAKE 1 TABLET (3.125 MG TOTAL) BY MOUTH 2 (TWO) TIMES DAILY WITH A MEAL., Disp: 180 tablet, Rfl: 2 .  colchicine 0.6 MG tablet, Take 0.6 mg by mouth daily., Disp: , Rfl:  .  furosemide (LASIX) 40 MG tablet, Take 1 tablet (40 mg total) by mouth daily as needed., Disp: 60 tablet, Rfl: 5 .  losartan (COZAAR) 50 MG tablet, Take 50 mg by mouth at bedtime. , Disp: , Rfl:  .  metFORMIN (GLUCOPHAGE) 500 MG tablet, Take 1 tablet (500 mg total) by mouth 2 (two) times daily with a meal., Disp: 60 tablet, Rfl: 0 .  QUEtiapine (SEROQUEL) 25 MG tablet, Take 25 mg by mouth at bedtime., Disp: , Rfl:  .  spironolactone (ALDACTONE) 25 MG tablet, Take 1 tablet (25 mg total) by mouth daily., Disp: 30 tablet, Rfl: 11 .  tamsulosin (FLOMAX) 0.4 MG CAPS capsule, Take 0.4 mg by mouth at bedtime. , Disp: , Rfl:  .  RELION PRIME TEST test strip, , Disp: , Rfl:  .  RELION ULTRA THIN PLUS LANCETS MISC, , Disp: , Rfl:  .  sacubitril-valsartan (ENTRESTO) 24-26 MG, Take 1 tablet by mouth 2 (two) times daily. (Patient not taking:  Reported on 03/07/2020), Disp: 60 tablet, Rfl: 11 .  simvastatin (ZOCOR) 40 MG tablet, Take 40 mg by mouth at bedtime. , Disp: , Rfl:  Allergies  Allergen Reactions  . Ibuprofen Nausea Only      Social History   Socioeconomic History  . Marital status: Widowed    Spouse name: Not on file  . Number of children: Not on file  . Years of education: Not on file  . Highest education level: Not on file  Occupational History  . Not on file  Tobacco Use  . Smoking status: Former Smoker    Packs/day: 0.50    Types: Cigarettes    Quit date: 03/07/1993    Years since quitting: 27.0  . Smokeless tobacco: Never Used  Substance and Sexual Activity  . Alcohol use: No  . Drug use: No  . Sexual activity: Not on file  Other Topics Concern  . Not on file  Social History Narrative  . Not on file   Social Determinants of Health   Financial Resource Strain:   . Difficulty of Paying Living Expenses:   Food Insecurity:   . Worried About Charity fundraiser in the Last Year:   . Arboriculturist in the Last Year:   Transportation Needs:   . Film/video editor (Medical):   Marland Kitchen Lack  of Transportation (Non-Medical):   Physical Activity:   . Days of Exercise per Week:   . Minutes of Exercise per Session:   Stress:   . Feeling of Stress :   Social Connections:   . Frequency of Communication with Friends and Family:   . Frequency of Social Gatherings with Friends and Family:   . Attends Religious Services:   . Active Member of Clubs or Organizations:   . Attends Banker Meetings:   Marland Kitchen Marital Status:   Intimate Partner Violence:   . Fear of Current or Ex-Partner:   . Emotionally Abused:   Marland Kitchen Physically Abused:   . Sexually Abused:     Physical Exam      No future appointments.  BP 90/60   Pulse 82   Temp 99.1 F (37.3 C)   Resp 20   Wt 222 lb (100.7 kg)   SpO2 98%   BMI 35.83 kg/m   Weight yesterday-222 Last visit weight-213  Pt had home visit with PCP  julie barr today.  The daughter reported that hospice nurse came out last week and wants to be in charge of his pill box, julie wants me to continue to come out to help manage his CHF symptoms.  He has a lot of swelling to his legs. His legs are weeping.  He said nurse was coming out Thursday to tend to his legs. He denies increased sob. The other week julie decreased his lasix to as needed. But I see now he has it placed in pill box daily. He states he is losing body mass b/c he is not eating much but he has a lot of fluid on him. His abd looks distended. Appetite comes and goes.  He c/o having dizziness the other day. He denies any c/p.  His PCP filled pill box today. He is missing his colchicine tablet. He states he is able to lay flat in the bed without difficulty.  I did send message to his PCP about things, will continue to see for now for more support in the home.   Kerry Hough, EMT-Paramedic [phone] [facility] Paramedic  03/27/20

## 2020-04-04 ENCOUNTER — Other Ambulatory Visit (HOSPITAL_COMMUNITY): Payer: Self-pay

## 2020-04-04 NOTE — Progress Notes (Signed)
Came by today for final visit with pt as he is in hospice care and PCP is managing all his meds. Pt was getting frustrated with nurse, PCP and myself coming out reviewing meds and it was getting him confused as well.  Since he is in hospice and PCP is managing meds and pt will not be going to clinic for further visits then paramedicine is no longer needed.  He did not wish for v/s to be taken today as nurse will be out later to change his leg wraps.   Kerry Hough, EMT-Paramedic  04/04/20

## 2020-04-15 ENCOUNTER — Emergency Department (HOSPITAL_COMMUNITY)

## 2020-04-15 ENCOUNTER — Other Ambulatory Visit: Payer: Self-pay

## 2020-04-15 ENCOUNTER — Encounter (HOSPITAL_COMMUNITY): Payer: Self-pay

## 2020-04-15 ENCOUNTER — Emergency Department (HOSPITAL_COMMUNITY)
Admission: EM | Admit: 2020-04-15 | Discharge: 2020-04-16 | Disposition: A | Discharge status: Home / Self Care | Attending: Emergency Medicine | Admitting: Emergency Medicine

## 2020-04-15 DIAGNOSIS — Z7982 Long term (current) use of aspirin: Secondary | ICD-10-CM | POA: Diagnosis not present

## 2020-04-15 DIAGNOSIS — N5089 Other specified disorders of the male genital organs: Secondary | ICD-10-CM | POA: Diagnosis not present

## 2020-04-15 DIAGNOSIS — Z79899 Other long term (current) drug therapy: Secondary | ICD-10-CM | POA: Diagnosis not present

## 2020-04-15 DIAGNOSIS — W19XXXA Unspecified fall, initial encounter: Secondary | ICD-10-CM | POA: Insufficient documentation

## 2020-04-15 DIAGNOSIS — Y939 Activity, unspecified: Secondary | ICD-10-CM | POA: Diagnosis not present

## 2020-04-15 DIAGNOSIS — I509 Heart failure, unspecified: Secondary | ICD-10-CM | POA: Insufficient documentation

## 2020-04-15 DIAGNOSIS — Z7984 Long term (current) use of oral hypoglycemic drugs: Secondary | ICD-10-CM | POA: Insufficient documentation

## 2020-04-15 DIAGNOSIS — S0990XA Unspecified injury of head, initial encounter: Secondary | ICD-10-CM | POA: Diagnosis not present

## 2020-04-15 DIAGNOSIS — Y999 Unspecified external cause status: Secondary | ICD-10-CM | POA: Diagnosis not present

## 2020-04-15 DIAGNOSIS — R609 Edema, unspecified: Secondary | ICD-10-CM | POA: Diagnosis not present

## 2020-04-15 DIAGNOSIS — Y929 Unspecified place or not applicable: Secondary | ICD-10-CM | POA: Diagnosis not present

## 2020-04-15 DIAGNOSIS — I110 Hypertensive heart disease with heart failure: Secondary | ICD-10-CM | POA: Diagnosis not present

## 2020-04-15 NOTE — ED Triage Notes (Signed)
Pt arrives via EMS for CC of fall. Pt reports dizzy spell that caused him to fall. Pt fell down two steps on R side. Reporting R side pain and groin pain. Denies hitting head/ LOC. Pt has hx of CHF. Pt on hospice care. DNR form at bedside. AOx4. GCS15.    BP: 112/72, HR83, O2 98%, temp 98.43f CBG 104

## 2020-04-15 NOTE — ED Provider Notes (Signed)
TIME SEEN: 11:27 PM  CHIEF COMPLAINT: Fall  HPI: Patient is a 79 year old male with history of hypertension, hyperlipidemia, CHF with an EF of 10 to 15% who presents the emergency department from home after he fell today.  States that he has a hard time walking due to chronic lower extremity swelling from his CHF and he thinks this is what led to his fall.  Nursing note reports that he felt dizzy but he denies this.  He denies any chest pain, shortness of breath.  He tells me that he does think he hit his head.  He is on aspirin but no anticoagulant.  He states he noticed today that his scrotum was swollen bilaterally and he was concerned this could be from the fall.  He states he was not aware of it before today.  He is not having testicular pain.  Urinating normally.  No neck or back pain.  No chest or abdominal pain.  ROS: See HPI Constitutional: no fever  Eyes: no drainage  ENT: no runny nose   Cardiovascular:  no chest pain  Resp: no SOB  GI: no vomiting GU: no dysuria Integumentary: no rash  Allergy: no hives  Musculoskeletal: no leg swelling  Neurological: no slurred speech ROS otherwise negative  PAST MEDICAL HISTORY/PAST SURGICAL HISTORY:  Past Medical History:  Diagnosis Date  . Congestive heart failure (HCC)    Ejection fraction around 15%. He was recently horpitalized for heart failure and had a heart catheterization. He had minor luminal irregularities.  . Hyperlipidemia   . Hypertension     MEDICATIONS:  Prior to Admission medications   Medication Sig Start Date End Date Taking? Authorizing Provider  aspirin 81 MG EC tablet Take 81 mg by mouth daily.     [provider]  Blood Glucose Monitoring Suppl (RELION PRIME MONITOR) DEVI  03/28/13   [provider]  carvedilol (COREG) 3.125 MG tablet TAKE 1 TABLET (3.125 MG TOTAL) BY MOUTH 2 (TWO) TIMES DAILY WITH A MEAL. 12/06/19   Clegg, Amy D, NP  colchicine 0.6 MG tablet Take 0.6 mg by mouth daily.     [provider]  furosemide (LASIX) 40 MG tablet Take 1 tablet (40 mg total) by mouth daily as needed. 11/28/19   Bensimhon, Bevelyn Buckles, MD  losartan (COZAAR) 50 MG tablet Take 50 mg by mouth at bedtime.     [provider]  metFORMIN (GLUCOPHAGE) 500 MG tablet Take 1 tablet (500 mg total) by mouth 2 (two) times daily with a meal. 02/28/13   Cristal Ford, MD  QUEtiapine (SEROQUEL) 25 MG tablet Take 25 mg by mouth at bedtime.    [provider]  RELION PRIME TEST test strip  03/28/13   [provider]  RELION ULTRA THIN PLUS LANCETS MISC  03/28/13   [provider]  sacubitril-valsartan (ENTRESTO) 24-26 MG Take 1 tablet by mouth 2 (two) times daily. Patient not taking: Reported on 03/07/2020 01/05/20   Bensimhon, Bevelyn Buckles, MD  simvastatin (ZOCOR) 40 MG tablet Take 40 mg by mouth at bedtime.     [provider]  spironolactone (ALDACTONE) 25 MG tablet Take 1 tablet (25 mg total) by mouth daily. 02/21/20   Robbie Lis M, PA-C  tamsulosin (FLOMAX) 0.4 MG CAPS capsule Take 0.4 mg by mouth at bedtime.     [provider]    ALLERGIES:  Allergies  Allergen Reactions  . Ibuprofen Nausea Only    SOCIAL HISTORY:  Social History  Tobacco Use  . Smoking status: Former Smoker    Packs/day: 0.50    Types: Cigarettes    Quit date: 03/07/1993    Years since quitting: 27.1  . Smokeless tobacco: Never Used  Substance Use Topics  . Alcohol use: No    FAMILY HISTORY: Family History  Problem Relation Age of Onset  . Cancer Mother   . Heart disease Father     EXAM: BP 115/77 (BP Location: Right Arm)   Pulse 80   Temp 98.5 F (36.9 C) (Oral)   Ht 5\' 6"  (1.676 m)   Wt 104.3 kg   SpO2 98%   BMI 37.12 kg/m  CONSTITUTIONAL: Alert and oriented and responds appropriately to questions.  Chronically ill-appearing.  Elderly.  Extremely pleasant. HEAD: Normocephalic, appears atraumatic EYES: Conjunctivae clear, pupils appear equal, EOM  appear intact ENT: normal nose; moist mucous membranes NECK: Supple, normal ROM, no midline spinal tenderness or step-off or deformity CARD: RRR; S1 and S2 appreciated; no murmurs, no clicks, no rubs, no gallops RESP: Normal chest excursion without splinting or tachypnea; breath sounds clear and equal bilaterally; no wheezes, no rhonchi, no rales, no hypoxia or respiratory distress, speaking full sentences ABD/GI: Normal bowel sounds; non-distended; soft, non-tender, no rebound, no guarding, no peritoneal signs, no hepatosplenomegaly, mild anasarca on exam GU:  Normal external genitalia, circumcised male, normal penile shaft, no blood or discharge at the urethral meatus, no testicular masses or tenderness on exam, no hernias appreciated, 2+ femoral pulses bilaterally; no perineal erythema, warmth, subcutaneous air or crepitus; no high riding testicle.  Significant scrotal swelling bilaterally without redness, warmth, induration or fluctuance. BACK:  The back appears normal, no midline spinal tenderness or step-off or deformity EXT: Normal ROM in all joints; no deformity noted, both of his lower extremities are wrapped at this time due to bilateral symmetric edema, no calf tenderness, no bony deformity noted SKIN: Normal color for age and race; warm; no rash on exposed skin NEURO: Moves all extremities equally, normal speech PSYCH: The patient's mood and manner are appropriate.   MEDICAL DECISION MAKING: Patient here with a fall.  States this is secondary to his lower extremity swelling.  He is on hospice due to his CHF and receives home health services.  Has a DNR.  He denies any new chest pain or shortness of breath today.  Currently hemodynamically stable.  Appears chronically volume overloaded.  He reports compliance with his diuretics at home.  He does note that he has swelling in his scrotum today which he attributes to the fall but I suspect this is likely secondary to his CHF.  He is not  complaining of any testicular pain but does report that he thinks he hit this area when he fell.  Will obtain scrotal ultrasound.  The rest of his exam is benign without sign of traumatic injury.  He now states he does think he may have hit his head.  Given his age, will obtain head CT.  He declines any pain medicine at this time.  EKG reviewed/interpreted and shows no acute abnormality.  Labs, imaging pending.  ED PROGRESS: 1:15 AM  Labs, imaging reviewed/interpreted.  BNP elevated today at 2470.  Again he denies chest pain or shortness of breath.  His lungs are clear to auscultation and he is satting in the upper 90s on room air.  No orthopnea.  CT of the head shows no acute intracranial abnormality.  Scrotal ultrasound shows diffuse scrotal wall thickening with bilateral hydroceles likely  due to CHF.  No torsion.  No infectious etiology.  He has been able to ambulate here.  He is requesting something to help with the swelling in his scrotum.  Will give dose of IV Lasix here.  He is on 40 mg furosemide as needed at home.  Had lengthy discussion with patient that given his EF of 10%, will be very difficult to ever get him to a point where he is euvolemic.  At this time I do not feel he needs admission especially given he is on hospice.  He is comfortable with this plan.  He is not in any extremis.  Urinalysis pending.  Will reassess after diuresis.  Urine shows no blood or sign of infection.  I feel patient is safe to be discharged home.  He is also comfortable with this plan.  I do not feel admission is appropriate given patient prefers no significant intervention for his heart failure.  He is declined defibrillator/pacemaker, infusions.  States he wants to die "natural death".  He states he is taking Lasix 40 mg every day.  I recommended that he increase this to 40 mg twice a day for the next 3 days and follow-up with his cardiologist for further outpatient recommendations.  At this time, I do not feel  there is any life-threatening condition present. I have reviewed, interpreted and discussed all results (EKG, imaging, lab, urine as appropriate) and exam findings with patient/family. I have reviewed nursing notes and appropriate previous records.  I feel the patient is safe to be discharged home without further emergent workup and can continue workup as an outpatient as needed. Discussed usual and customary return precautions. Patient/family verbalize understanding and are comfortable with this plan.  Outpatient follow-up has been provided as needed. All questions have been answered.   EKG Interpretation  Date/Time:  Sunday April 15 2020 22:54:03 EDT Ventricular Rate:  79 PR Interval:    QRS Duration: 112 QT Interval:  396 QTC Calculation: 308 R Axis:   2 Text Interpretation: Sinus rhythm Multiple ventricular premature complexes Prolonged PR interval Incomplete left bundle branch block Borderline low voltage, extremity leads No significant change since last tracing Confirmed by Pryor Curia [phone]) on 04/15/2020 11:27:35 PM         Trevor Rodriguez. was evaluated in Emergency Department on 04/15/2020 for the symptoms described in the history of present illness. He was evaluated in the context of the global COVID-19 pandemic, which necessitated consideration that the patient might be at risk for infection with the SARS-CoV-2 virus that causes COVID-19. Institutional protocols and algorithms that pertain to the evaluation of patients at risk for COVID-19 are in a state of rapid change based on information released by regulatory bodies including the CDC and federal and state organizations. These policies and algorithms were followed during the patient's care in the ED.      Patti Shorb, Delice Bison, DO 04/16/20 [phone]

## 2020-04-16 ENCOUNTER — Emergency Department (HOSPITAL_COMMUNITY)

## 2020-04-16 LAB — COMPREHENSIVE METABOLIC PANEL
ALT: 15 U/L (ref 0–44)
AST: 20 U/L (ref 15–41)
Albumin: 3.4 g/dL — ABNORMAL LOW (ref 3.5–5.0)
Alkaline Phosphatase: 41 U/L (ref 38–126)
Anion gap: 13 (ref 5–15)
BUN: 22 mg/dL (ref 8–23)
CO2: 25 mmol/L (ref 22–32)
Calcium: 9.1 mg/dL (ref 8.9–10.3)
Chloride: 103 mmol/L (ref 98–111)
Creatinine, Ser: 1.17 mg/dL (ref 0.61–1.24)
GFR calc Af Amer: >60 mL/min (ref >60)
GFR calc non Af Amer: 59 mL/min — ABNORMAL LOW (ref >60)
Glucose, Bld: 92 mg/dL (ref 70–99)
Potassium: 3.6 mmol/L (ref 3.5–5.1)
Sodium: 141 mmol/L (ref 135–145)
Total Bilirubin: 0.5 mg/dL (ref 0.3–1.2)
Total Protein: 5.9 g/dL — ABNORMAL LOW (ref 6.5–8.1)

## 2020-04-16 LAB — CBC WITH DIFFERENTIAL/PLATELET
Abs Immature Granulocytes: 0.04 10*3/uL (ref 0.00–0.07)
Basophils Absolute: 0 10*3/uL (ref 0.0–0.1)
Basophils Relative: 0 %
Eosinophils Absolute: 0.1 10*3/uL (ref 0.0–0.5)
Eosinophils Relative: 1 %
HCT: 37.2 % — ABNORMAL LOW (ref 39.0–52.0)
Hemoglobin: 11.9 g/dL — ABNORMAL LOW (ref 13.0–17.0)
Immature Granulocytes: 1 %
Lymphocytes Relative: 26 %
Lymphs Abs: 0.9 10*3/uL (ref 0.7–4.0)
MCH: 32.2 pg (ref 26.0–34.0)
MCHC: 32 g/dL (ref 30.0–36.0)
MCV: 100.5 fL — ABNORMAL HIGH (ref 80.0–100.0)
Monocytes Absolute: 0.2 10*3/uL (ref 0.1–1.0)
Monocytes Relative: 6 %
Neutro Abs: 2.3 10*3/uL (ref 1.7–7.7)
Neutrophils Relative %: 66 %
Platelets: 154 10*3/uL (ref 150–400)
RBC: 3.7 MIL/uL — ABNORMAL LOW (ref 4.22–5.81)
RDW: 15.9 % — ABNORMAL HIGH (ref 11.5–15.5)
WBC: 3.5 10*3/uL — ABNORMAL LOW (ref 4.0–10.5)
nRBC: 0 % (ref 0.0–0.2)

## 2020-04-16 LAB — URINALYSIS, ROUTINE W REFLEX MICROSCOPIC
Bilirubin Urine: NEGATIVE
Glucose, UA: NEGATIVE mg/dL
Hgb urine dipstick: NEGATIVE
Ketones, ur: NEGATIVE mg/dL
Leukocytes,Ua: NEGATIVE
Nitrite: NEGATIVE
Protein, ur: 30 mg/dL — AB
Specific Gravity, Urine: 1.013 (ref 1.005–1.030)
pH: 5 (ref 5.0–8.0)

## 2020-04-16 LAB — BRAIN NATRIURETIC PEPTIDE: B Natriuretic Peptide: 2470.8 pg/mL — ABNORMAL HIGH (ref 0.0–100.0)

## 2020-04-16 MED ORDER — FUROSEMIDE 10 MG/ML IJ SOLN
40.0000 mg | Freq: Once | INTRAMUSCULAR | Status: AC
  Administered 2020-04-16: 40 mg via INTRAVENOUS
  Filled 2020-04-16: qty 4

## 2020-04-16 NOTE — ED Notes (Addendum)
Patient verbalizes understanding of discharge instructions. Opportunity for questioning and answers were provided. Armband removed by staff, pt discharged from ED via wheelchair.  

## 2020-04-16 NOTE — Discharge Instructions (Signed)
I recommend that you increase your Lasix and take 40 mg twice a day for the next 3 days.  Please call your cardiologist in the morning for further outpatient recommendations.  Other than being volume overloaded which we know is due to your severe heart failure, your labs, urine, imaging today was reassuring.

## 2020-04-16 NOTE — ED Notes (Signed)
Pt returned from US

## 2020-04-16 NOTE — ED Notes (Signed)
Pt ambulated around room with no assistance, steady gait noted.

## 2020-04-17 ENCOUNTER — Emergency Department
Admission: EM | Admit: 2020-04-17 | Discharge: 2020-04-17 | Disposition: A | Discharge status: Home / Self Care | Attending: Emergency Medicine | Admitting: Emergency Medicine

## 2020-04-17 ENCOUNTER — Encounter: Payer: Self-pay | Admitting: *Deleted

## 2020-04-17 ENCOUNTER — Emergency Department

## 2020-04-17 ENCOUNTER — Other Ambulatory Visit: Payer: Self-pay

## 2020-04-17 DIAGNOSIS — E119 Type 2 diabetes mellitus without complications: Secondary | ICD-10-CM | POA: Insufficient documentation

## 2020-04-17 DIAGNOSIS — I509 Heart failure, unspecified: Secondary | ICD-10-CM | POA: Diagnosis not present

## 2020-04-17 DIAGNOSIS — Z7984 Long term (current) use of oral hypoglycemic drugs: Secondary | ICD-10-CM | POA: Insufficient documentation

## 2020-04-17 DIAGNOSIS — I10 Essential (primary) hypertension: Secondary | ICD-10-CM | POA: Insufficient documentation

## 2020-04-17 DIAGNOSIS — N5089 Other specified disorders of the male genital organs: Secondary | ICD-10-CM | POA: Diagnosis not present

## 2020-04-17 DIAGNOSIS — Z79899 Other long term (current) drug therapy: Secondary | ICD-10-CM | POA: Insufficient documentation

## 2020-04-17 DIAGNOSIS — Z87891 Personal history of nicotine dependence: Secondary | ICD-10-CM | POA: Diagnosis not present

## 2020-04-17 DIAGNOSIS — Z7982 Long term (current) use of aspirin: Secondary | ICD-10-CM | POA: Insufficient documentation

## 2020-04-17 LAB — CBC WITH DIFFERENTIAL/PLATELET
Abs Immature Granulocytes: 0.01 10*3/uL (ref 0.00–0.07)
Basophils Absolute: 0 10*3/uL (ref 0.0–0.1)
Basophils Relative: 1 %
Eosinophils Absolute: 0 10*3/uL (ref 0.0–0.5)
Eosinophils Relative: 1 %
HCT: 36.6 % — ABNORMAL LOW (ref 39.0–52.0)
Hemoglobin: 12 g/dL — ABNORMAL LOW (ref 13.0–17.0)
Immature Granulocytes: 0 %
Lymphocytes Relative: 26 %
Lymphs Abs: 0.9 10*3/uL (ref 0.7–4.0)
MCH: 32.7 pg (ref 26.0–34.0)
MCHC: 32.8 g/dL (ref 30.0–36.0)
MCV: 99.7 fL (ref 80.0–100.0)
Monocytes Absolute: 0.3 10*3/uL (ref 0.1–1.0)
Monocytes Relative: 8 %
Neutro Abs: 2.1 10*3/uL (ref 1.7–7.7)
Neutrophils Relative %: 64 %
Platelets: 160 10*3/uL (ref 150–400)
RBC: 3.67 MIL/uL — ABNORMAL LOW (ref 4.22–5.81)
RDW: 15.9 % — ABNORMAL HIGH (ref 11.5–15.5)
WBC: 3.3 10*3/uL — ABNORMAL LOW (ref 4.0–10.5)
nRBC: 0 % (ref 0.0–0.2)

## 2020-04-17 LAB — COMPREHENSIVE METABOLIC PANEL
ALT: 15 U/L (ref 0–44)
AST: 17 U/L (ref 15–41)
Albumin: 3.8 g/dL (ref 3.5–5.0)
Alkaline Phosphatase: 39 U/L (ref 38–126)
Anion gap: 12 (ref 5–15)
BUN: 26 mg/dL — ABNORMAL HIGH (ref 8–23)
CO2: 25 mmol/L (ref 22–32)
Calcium: 9.1 mg/dL (ref 8.9–10.3)
Chloride: 103 mmol/L (ref 98–111)
Creatinine, Ser: 1.18 mg/dL (ref 0.61–1.24)
GFR calc Af Amer: >60 mL/min (ref >60)
GFR calc non Af Amer: 58 mL/min — ABNORMAL LOW (ref >60)
Glucose, Bld: 88 mg/dL (ref 70–99)
Potassium: 3.5 mmol/L (ref 3.5–5.1)
Sodium: 140 mmol/L (ref 135–145)
Total Bilirubin: 1.2 mg/dL (ref 0.3–1.2)
Total Protein: 6.9 g/dL (ref 6.5–8.1)

## 2020-04-17 LAB — URINALYSIS, COMPLETE (UACMP) WITH MICROSCOPIC
Bacteria, UA: NONE SEEN
Bilirubin Urine: NEGATIVE
Glucose, UA: NEGATIVE mg/dL
Hgb urine dipstick: NEGATIVE
Ketones, ur: NEGATIVE mg/dL
Leukocytes,Ua: NEGATIVE
Nitrite: NEGATIVE
Protein, ur: NEGATIVE mg/dL
Specific Gravity, Urine: 1.01 (ref 1.005–1.030)
Squamous Epithelial / HPF: NONE SEEN (ref 0–5)
pH: 5 (ref 5.0–8.0)

## 2020-04-17 MED ORDER — IOHEXOL 300 MG/ML  SOLN
100.0000 mL | Freq: Once | INTRAMUSCULAR | Status: AC | PRN
  Administered 2020-04-17: 100 mL via INTRAVENOUS

## 2020-04-17 NOTE — ED Triage Notes (Addendum)
Pt to triage via wheelchair.  Pt reports testicular swelling for 3 days.  Also pain with urination.  Pt has urinary incontinence in triage.   No known injury to area. pt has swelling to both lower legs.  No sob or chest pain  Pt alert  Speech clear.

## 2020-04-17 NOTE — ED Provider Notes (Signed)
[facility] Emergency Department Provider Note  ____________________________________________   First MD Initiated Contact with Patient 04/17/20 1606     (approximate)  I have reviewed the triage vital signs and the nursing notes.   HISTORY  Chief Complaint Groin Swelling    HPI Regginald Pask. is a 79 y.o. male with a EF of 10 to 15% who comes in with testicle swelling.  Patient dates that he had testicle swelling for the past 3 days.  He states that seems to be more swollen than yesterday.  Denies any pain, redness, fevers.  The swelling has been constant, nothing makes it better, nothing makes it worse.  Patient is unsure how much Lasix he is taking.  I asked patient if he remembers a conversation at [facility] yesterday and he said that he was not told anything in which is kicked out.  Patient states that his biggest concern is not doing the urine out and urinating all over himself.  He endorses some shortness of breath but he states he always has not.   On review of records patient has a history of severe CHF and was seen yesterday at [facility].  Patient had BNP of 2470.  Scrotal ultrasound showed diffuse scrotal wall thickening with bilateral hydroceles likely due to CHF no evidence of torsion no evidence of infection.  They gave patient a dose of IV Lasix.  Patient is on 40 mg of Lasix daily.. patient is on hospice.  They recommended that he take the Lasix 40 mg twice daily for the next 3 days          Past Medical History:  Diagnosis Date  . Congestive heart failure (HCC)    Ejection fraction around 15%. He was recently horpitalized for heart failure and had a heart catheterization. He had minor luminal irregularities.  . Hyperlipidemia   . Hypertension     Patient Active Problem List   Diagnosis Date Noted  . Acute systolic HF (heart failure) (HCC) 06/26/2019  . 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 Type II or unspecified type diabetes mellitus without mention of complication, not stated as uncontrolled 02/22/2013  . CHF (congestive heart failure) (HCC) 02/20/2013  . Hyperglycemia 02/20/2013  . Essential hypertension, benign 02/20/2013  . Other and unspecified hyperlipidemia 02/20/2013    Past Surgical History:  Procedure Laterality Date  . CARDIAC CATHETERIZATION  2011  . INCISION AND DRAINAGE ABSCESS N/A 02/20/2013   Procedure: Debridemnt of lower abdominal wall, suprapubic region, scrotum and right thigh;  Surgeon: Wilmon Arms. Corliss Skains, MD;  Location: [facility] OR;  Service: General;  Laterality: N/A;    Prior to Admission medications   Medication Sig Start Date End Date Taking? Authorizing Provider  aspirin 81 MG EC tablet Take 81 mg by mouth daily.     [provider]  Blood Glucose Monitoring Suppl (RELION PRIME MONITOR) DEVI  03/28/13   [provider]  carvedilol (COREG) 3.125 MG tablet TAKE 1 TABLET (3.125 MG TOTAL) BY MOUTH 2 (TWO) TIMES DAILY WITH A MEAL. 12/06/19   Clegg, Amy D, NP  colchicine 0.6 MG tablet Take 0.6 mg by mouth daily.    [provider]  furosemide (LASIX) 40 MG tablet Take 1 tablet (40 mg total) by mouth daily as needed. 11/28/19   Bensimhon, Bevelyn Buckles, MD  losartan (COZAAR) 50 MG tablet Take 50 mg by mouth at bedtime.     [provider]  metFORMIN (GLUCOPHAGE) 500 MG tablet Take 1 tablet (500 mg total) by mouth 2 (two) times daily with a meal. 02/28/13   Cristal Ford, MD  QUEtiapine (SEROQUEL) 25 MG tablet Take 25 mg by mouth at bedtime.    [provider]  RELION PRIME TEST test strip  03/28/13   [provider]  RELION ULTRA THIN PLUS LANCETS MISC  03/28/13   [provider]  sacubitril-valsartan (ENTRESTO) 24-26 MG Take 1 tablet by mouth 2 (two) times daily. Patient not taking: Reported on 03/07/2020 01/05/20   Bensimhon, Bevelyn Buckles, MD  simvastatin (ZOCOR) 40 MG tablet Take 40 mg by mouth at  bedtime.     [provider]  spironolactone (ALDACTONE) 25 MG tablet Take 1 tablet (25 mg total) by mouth daily. 02/21/20   Robbie Lis M, PA-C  tamsulosin (FLOMAX) 0.4 MG CAPS capsule Take 0.4 mg by mouth at bedtime.     [provider]    Allergies Ibuprofen  Family History  Problem Relation Age of Onset  . Cancer Mother   . Heart disease Father     Social History Social History   Tobacco Use  . Smoking status: Former Smoker    Packs/day: 0.50    Types: Cigarettes    Quit date: 03/07/1993    Years since quitting: 27.1  . Smokeless tobacco: Never Used  Substance Use Topics  . Alcohol use: No  . Drug use: No      Review of Systems Constitutional: No fever/chills Eyes: No visual changes. ENT: No sore throat. Cardiovascular: Denies chest pain. Respiratory: Chronic shortness of breath Gastrointestinal: No abdominal pain.  No nausea, no vomiting.  No diarrhea.  No constipation. Genitourinary: Testicle swelling, difficulty urinating Musculoskeletal: Negative for back pain. Skin: Negative for rash. Neurological: Negative for headaches, focal weakness or numbness. All other ROS negative ____________________________________________   PHYSICAL EXAM:  VITAL SIGNS: ED Triage Vitals  Enc Vitals Group     BP 04/17/20 1517 109/75     Pulse Rate 04/17/20 1517 92     Resp 04/17/20 1517 18     Temp 04/17/20 1517 98 F (36.7 C)     Temp Source 04/17/20 1517 Oral     SpO2 04/17/20 1517 100 %     Weight 04/17/20 1519 230 lb (104.3 kg)     Height 04/17/20 1519 5\' 6"  (1.676 m)     Head Circumference --      Peak Flow --      Pain Score 04/17/20 1518 7     Pain Loc --      Pain Edu? --      Excl. in GC? --     Constitutional: Alert and oriented. Well appearing and in no acute distress. Eyes: Conjunctivae are normal. EOMI. Head: Atraumatic. Nose: No congestion/rhinnorhea. Mouth/Throat: Mucous membranes are moist.   Neck: No stridor. Trachea  Midline. FROM Cardiovascular: Normal rate, regular rhythm. Grossly normal heart sounds.  Good peripheral circulation. Respiratory: Normal respiratory effort.  No retractions. Lungs CTAB. Gastrointestinal: Soft and nontender. No distention. No abdominal bruits.  Musculoskeletal: No lower extremity tenderness nor edema.  No joint effusions. Neurologic:  Normal speech and language. No gross focal neurologic deficits are appreciated.  Skin:  Skin is warm, dry and intact. No rash noted. Psychiatric: Mood and affect are normal. Speech and behavior are normal. GU: Significant swollen testicles bilaterally without any warmth or erythema or crepitus felt.  Able to express the penile head without any  discharge noted.  ____________________________________________   LABS (all labs ordered are listed, but only abnormal results are displayed)  Labs Reviewed  CBC WITH DIFFERENTIAL/PLATELET - Abnormal; Notable for the following components:      Result Value   WBC 3.3 (*)    RBC 3.67 (*)    Hemoglobin 12.0 (*)    HCT 36.6 (*)    RDW 15.9 (*)    All other components within normal limits  COMPREHENSIVE METABOLIC PANEL - Abnormal; Notable for the following components:   BUN 26 (*)    GFR calc non Af Amer 58 (*)    All other components within normal limits  URINALYSIS, COMPLETE (UACMP) WITH MICROSCOPIC - Abnormal; Notable for the following components:   Color, Urine YELLOW (*)    APPearance CLEAR (*)    All other components within normal limits   ____________________________________________   RADIOLOGY Vela Prose, personally viewed and evaluated these images (plain radiographs) as part of my medical decision making, as well as reviewing the written report by the radiologist.  ED MD interpretation: Possible atelectasis versus infiltrate and effusion on the left  Official radiology report(s): CT ABDOMEN PELVIS W CONTRAST  Result Date: 04/17/2020 CLINICAL DATA:  Scrotal swelling for several  days EXAM: CT ABDOMEN AND PELVIS WITH CONTRAST TECHNIQUE: Multidetector CT imaging of the abdomen and pelvis was performed using the standard protocol following bolus administration of intravenous contrast. CONTRAST:  OMNIPAQUE IOHEXOL 300 MG/ML  SOLN COMPARISON:  Ultrasound from the previous day, 02/20/2013. FINDINGS: Lower chest: Right lung base is clear. Small left pleural effusion is noted with mild associated atelectasis. Heart is enlarged in size. Hepatobiliary: Gallbladder is well distended. The liver is within normal limits. Some reflux of contrast into the hepatic veins is noted consistent with elevated central venous pressure. Pancreas: Unremarkable. No pancreatic ductal dilatation or surrounding inflammatory changes. Spleen: Normal in size without focal abnormality. Adrenals/Urinary Tract: Adrenal glands appear within normal limits. Kidneys demonstrate bilateral renal cystic change worse on the left than the right stable in appearance from previous exams. Largest of the cyst measures 6.1 cm. No renal calculi or obstructive changes are noted. Delayed images demonstrate normal excretion of contrast material. The bladder is decompressed. Stomach/Bowel: No obstructive or inflammatory changes of the bowel are identified. The appendix is within normal limits without inflammatory change. No small bowel abnormality is noted. Stomach is decompressed. Vascular/Lymphatic: Aortic atherosclerosis. No enlarged abdominal or pelvic lymph nodes. Reproductive: Prostate is unremarkable. There is diffuse scrotal edema identified as well as bilateral hydrocele similar to that seen on the prior ultrasound examination. No subcutaneous air to suggest localized gangrene is identified at this time. Other: Changes of anasarca and mild ascites are seen these changes are all likely related to volume overload and mild congestive failure. Musculoskeletal: Diffuse anterior flowing osteophytes are noted throughout the lower  thoracic and lumbar spine. No acute bony abnormality is noted. IMPRESSION: Scrotal swelling with edema and bilateral hydrocele similar to that seen on recent ultrasound examination. The hydroceles are stable from a prior CT from 2014. Diffuse mild ascites and changes of anasarca are noted likely related to volume overload/CHF. No subcutaneous air is noted to suggest Fournier's gangrene. Mild left basilar atelectasis with left-sided pleural effusion. 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 Multiple renal cysts. Electronically Signed   By: Alcide Clever M.D.   On: 04/17/2020 17:58   DG Chest Portable 1 View  Result Date: 04/17/2020 CLINICAL DATA:  Shortness of breath.  EXAM: PORTABLE CHEST 1 VIEW COMPARISON:  June 26, 2019. FINDINGS: Stable cardiomegaly. No pneumothorax is noted. Right lung is clear. Interval development of mild left basilar atelectasis or infiltrate is noted with associated small pleural effusion. Bony thorax is unremarkable. IMPRESSION: Interval development of mild left basilar atelectasis or infiltrate is noted with associated small pleural effusion. Electronically Signed   By: Lupita Raider M.D.   On: 04/17/2020 16:55    ____________________________________________   PROCEDURES  Procedure(s) performed (including Critical Care):  Procedures   ____________________________________________   INITIAL IMPRESSION / ASSESSMENT AND PLAN / ED COURSE  Joshiah Schaal. was evaluated in Emergency Department on 04/17/2020 for the symptoms described in the history of present illness. He was evaluated in the context of the global COVID-19 pandemic, which necessitated consideration that the patient might be at risk for infection with the SARS-CoV-2 virus that causes COVID-19. Institutional protocols and algorithms that pertain to the evaluation of patients at risk for COVID-19 are in a state of rapid change based on information released by regulatory bodies including the CDC and  federal and state organizations. These policies and algorithms were followed during the patient's care in the ED.    Patient is an 79 year old who comes in with significant testicle swelling.  I suspect this is most likely secondary to fluid overload from his low EF of 10 to 15%.  Patient's biggest concern is the difficulty urinating on himself and feeling that he is acting every urine out.  Labs to evaluate for Electra abnormalities, AKI.  Will get ultrasound to see patients retaining and discussed the possibility of catheter for comfort.  Reviewed patient's record he does have a history of having Fournier's gangrene as we discussed CT imaging although very low suspicion given his reassuring exam and patient is afebrile.  Patient would like to proceed with CT imaging although he does state that he is on hospice and is not sure if he would want interventions.  Patient states that he always has shortness of breath but given this testicle swelling will get a chest x-ray just to make sure there is no evidence of large pleural effusions.   Patient is white count is stable from yesterday.  Hemoglobin is stable. No evidence of significant kidney dysfunction.  Electrolytes are normal.  I personally reviewed the chest x-ray looks like atelectasis in the left lung base versus possibility of an infiltrate but CT imaging actually caught that side and they stated that it looked like atelectasis.  Patient has no fever no worsening cough or worsening shortness of breath to suggest this being pneumonia.  At this time we can hold off on antibiotics.  He does have a little bit of pleural effusion there most likely secondary to his fluid overload but he is starting 100% on room air.  Discussed with patient negative CT imaging.  Post void was about 200 cc.  We discussed the benefits and cons of catheter and patient would like to have a catheter placed for comfort.  I also did discuss with the hospice team and they are aware  the patient is here medically with catheter placement.  I did call both the daughter and son and updated them and they were also agreeable.  They will continue to take the Lasix 40 mg twice daily for the next few days and have follow-up to see if the swelling is getting better.  At this time I think it be safe to discharge patient back home under hospice care  with Foley catheter in place  ____________________________________________   FINAL CLINICAL IMPRESSION(S) / ED DIAGNOSES   Final diagnoses:  Testicle swelling      MEDICATIONS GIVEN DURING THIS VISIT:  Medications  iohexol (OMNIPAQUE) 300 MG/ML solution 100 mL (100 mLs Intravenous Contrast Given 04/17/20 1733)     ED Discharge Orders    None       Note:  This document was prepared using Dragon voice recognition software and may include unintentional dictation errors.   Vanessa [place], MD 04/17/20 Pauline Aus

## 2020-04-17 NOTE — Discharge Instructions (Addendum)
Patient scrotal swelling is secondary to being fluid overload.  He is taking Lasix 40 mg twice daily for the next 3 days to see if that helps it improve.  We placed a Foley catheter for comfort so that he is not urinating all over himself given the swelling.  Once the swelling has come down this can probably be removed by one of the hospice nurses.  The CT imaging was negative for any signs of infection.  He should return to the ER if he develops worsening shortness of breath, unable to produce urine or any other concerns He should follow-up with the primary doctor and the hospice doctor in 3 days.   Scrotal swelling with edema and bilateral hydrocele similar to that seen on recent ultrasound examination. The hydroceles are stable from a prior CT from 2014. Diffuse mild ascites and changes of anasarca are noted likely related to volume overload/CHF.   No subcutaneous air is noted to suggest Fournier's gangrene.    Mild left basilar atelectasis with left-sided pleural effusion.   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   Multiple renal cysts.

## 2020-04-17 NOTE — ED Notes (Signed)
Pt's Daughter Meriam Sprague was contacted and notified of pt's pending d/c. Daughter informed this RN that she/her brother will be on the way to pick said pt up in the next 20-30 minutes.   This RN attempted to call [facility] and was unable to get in touch with a nurse to give report.

## 2020-04-17 NOTE — ED Notes (Signed)
Pt was d/c to his son at this time. Pt/ son unable to sign E-signature pad at this time due to its malfunction but both parties verbalized an understanding of the d/c instructions and follow up care and had no further questions for this RN.

## 2020-04-23 ENCOUNTER — Encounter: Payer: Self-pay | Admitting: Emergency Medicine

## 2020-04-23 ENCOUNTER — Inpatient Hospital Stay
Admission: EM | Admit: 2020-04-23 | Discharge: 2020-05-01 | DRG: 291 | Disposition: A | Discharge status: Home Health | Attending: Internal Medicine | Admitting: Internal Medicine

## 2020-04-23 ENCOUNTER — Other Ambulatory Visit: Payer: Self-pay

## 2020-04-23 DIAGNOSIS — N390 Urinary tract infection, site not specified: Secondary | ICD-10-CM | POA: Diagnosis present

## 2020-04-23 DIAGNOSIS — Z87891 Personal history of nicotine dependence: Secondary | ICD-10-CM | POA: Diagnosis not present

## 2020-04-23 DIAGNOSIS — E1110 Type 2 diabetes mellitus with ketoacidosis without coma: Secondary | ICD-10-CM | POA: Diagnosis not present

## 2020-04-23 DIAGNOSIS — Z7984 Long term (current) use of oral hypoglycemic drugs: Secondary | ICD-10-CM

## 2020-04-23 DIAGNOSIS — N400 Enlarged prostate without lower urinary tract symptoms: Secondary | ICD-10-CM | POA: Diagnosis present

## 2020-04-23 DIAGNOSIS — R0902 Hypoxemia: Secondary | ICD-10-CM | POA: Diagnosis present

## 2020-04-23 DIAGNOSIS — R319 Hematuria, unspecified: Secondary | ICD-10-CM | POA: Diagnosis present

## 2020-04-23 DIAGNOSIS — E0781 Sick-euthyroid syndrome: Secondary | ICD-10-CM | POA: Diagnosis present

## 2020-04-23 DIAGNOSIS — N5089 Other specified disorders of the male genital organs: Secondary | ICD-10-CM | POA: Diagnosis not present

## 2020-04-23 DIAGNOSIS — Z79899 Other long term (current) drug therapy: Secondary | ICD-10-CM | POA: Diagnosis not present

## 2020-04-23 DIAGNOSIS — I130 Hypertensive heart and chronic kidney disease with heart failure and stage 1 through stage 4 chronic kidney disease, or unspecified chronic kidney disease: Secondary | ICD-10-CM | POA: Diagnosis present

## 2020-04-23 DIAGNOSIS — R946 Abnormal results of thyroid function studies: Secondary | ICD-10-CM | POA: Diagnosis present

## 2020-04-23 DIAGNOSIS — Z8249 Family history of ischemic heart disease and other diseases of the circulatory system: Secondary | ICD-10-CM

## 2020-04-23 DIAGNOSIS — Z20822 Contact with and (suspected) exposure to covid-19: Secondary | ICD-10-CM | POA: Diagnosis present

## 2020-04-23 DIAGNOSIS — R680 Hypothermia, not associated with low environmental temperature: Secondary | ICD-10-CM | POA: Diagnosis present

## 2020-04-23 DIAGNOSIS — N179 Acute kidney failure, unspecified: Secondary | ICD-10-CM | POA: Diagnosis present

## 2020-04-23 DIAGNOSIS — N4889 Other specified disorders of penis: Secondary | ICD-10-CM | POA: Diagnosis present

## 2020-04-23 DIAGNOSIS — Z7982 Long term (current) use of aspirin: Secondary | ICD-10-CM

## 2020-04-23 DIAGNOSIS — Z515 Encounter for palliative care: Secondary | ICD-10-CM | POA: Diagnosis present

## 2020-04-23 DIAGNOSIS — E1122 Type 2 diabetes mellitus with diabetic chronic kidney disease: Secondary | ICD-10-CM | POA: Diagnosis present

## 2020-04-23 DIAGNOSIS — N189 Chronic kidney disease, unspecified: Secondary | ICD-10-CM | POA: Diagnosis present

## 2020-04-23 DIAGNOSIS — I5023 Acute on chronic systolic (congestive) heart failure: Secondary | ICD-10-CM | POA: Diagnosis present

## 2020-04-23 DIAGNOSIS — I5021 Acute systolic (congestive) heart failure: Secondary | ICD-10-CM

## 2020-04-23 DIAGNOSIS — E785 Hyperlipidemia, unspecified: Secondary | ICD-10-CM | POA: Diagnosis present

## 2020-04-23 DIAGNOSIS — I10 Essential (primary) hypertension: Secondary | ICD-10-CM

## 2020-04-23 DIAGNOSIS — I509 Heart failure, unspecified: Secondary | ICD-10-CM

## 2020-04-23 DIAGNOSIS — N433 Hydrocele, unspecified: Secondary | ICD-10-CM | POA: Diagnosis present

## 2020-04-23 LAB — URINALYSIS, ROUTINE W REFLEX MICROSCOPIC
Bilirubin Urine: NEGATIVE
Glucose, UA: NEGATIVE mg/dL
Ketones, ur: 5 mg/dL — AB
Nitrite: POSITIVE — AB
Protein, ur: 30 mg/dL — AB
RBC / HPF: >50 RBC/hpf — ABNORMAL HIGH (ref 0–5)
Specific Gravity, Urine: 1.011 (ref 1.005–1.030)
Squamous Epithelial / HPF: NONE SEEN (ref 0–5)
pH: 5 (ref 5.0–8.0)

## 2020-04-23 LAB — COMPREHENSIVE METABOLIC PANEL
ALT: 78 U/L — ABNORMAL HIGH (ref 0–44)
AST: 45 U/L — ABNORMAL HIGH (ref 15–41)
Albumin: 3.7 g/dL (ref 3.5–5.0)
Alkaline Phosphatase: 44 U/L (ref 38–126)
Anion gap: 14 (ref 5–15)
BUN: 34 mg/dL — ABNORMAL HIGH (ref 8–23)
CO2: 24 mmol/L (ref 22–32)
Calcium: 9 mg/dL (ref 8.9–10.3)
Chloride: 101 mmol/L (ref 98–111)
Creatinine, Ser: 1.45 mg/dL — ABNORMAL HIGH (ref 0.61–1.24)
GFR calc Af Amer: 53 mL/min — ABNORMAL LOW (ref >60)
GFR calc non Af Amer: 45 mL/min — ABNORMAL LOW (ref >60)
Glucose, Bld: 82 mg/dL (ref 70–99)
Potassium: 4.3 mmol/L (ref 3.5–5.1)
Sodium: 139 mmol/L (ref 135–145)
Total Bilirubin: 1.5 mg/dL — ABNORMAL HIGH (ref 0.3–1.2)
Total Protein: 6.8 g/dL (ref 6.5–8.1)

## 2020-04-23 LAB — GLUCOSE, CAPILLARY
Glucose-Capillary: 80 mg/dL (ref 70–99)
Glucose-Capillary: 93 mg/dL (ref 70–99)
Glucose-Capillary: 81 mg/dL (ref 70–99)

## 2020-04-23 LAB — RESPIRATORY PANEL BY RT PCR (FLU A&B, COVID)
Influenza A by PCR: NEGATIVE
Influenza B by PCR: NEGATIVE
SARS Coronavirus 2 by RT PCR: NEGATIVE

## 2020-04-23 LAB — CBC WITH DIFFERENTIAL/PLATELET
Abs Immature Granulocytes: 0.02 10*3/uL (ref 0.00–0.07)
Basophils Absolute: 0 10*3/uL (ref 0.0–0.1)
Basophils Relative: 0 %
Eosinophils Absolute: 0 10*3/uL (ref 0.0–0.5)
Eosinophils Relative: 0 %
HCT: 41.4 % (ref 39.0–52.0)
Hemoglobin: 13.5 g/dL (ref 13.0–17.0)
Immature Granulocytes: 0 %
Lymphocytes Relative: 15 %
Lymphs Abs: 0.8 10*3/uL (ref 0.7–4.0)
MCH: 32.1 pg (ref 26.0–34.0)
MCHC: 32.6 g/dL (ref 30.0–36.0)
MCV: 98.3 fL (ref 80.0–100.0)
Monocytes Absolute: 0.3 10*3/uL (ref 0.1–1.0)
Monocytes Relative: 6 %
Neutro Abs: 4.1 10*3/uL (ref 1.7–7.7)
Neutrophils Relative %: 79 %
Platelets: 177 10*3/uL (ref 150–400)
RBC: 4.21 MIL/uL — ABNORMAL LOW (ref 4.22–5.81)
RDW: 16.3 % — ABNORMAL HIGH (ref 11.5–15.5)
WBC: 5.3 10*3/uL (ref 4.0–10.5)
nRBC: 0 % (ref 0.0–0.2)

## 2020-04-23 LAB — BRAIN NATRIURETIC PEPTIDE: B Natriuretic Peptide: 2714 pg/mL — ABNORMAL HIGH (ref 0.0–100.0)

## 2020-04-23 MED ORDER — SODIUM CHLORIDE 0.9 % IV SOLN: 250.0000 mL | INTRAVENOUS | Status: DC | PRN

## 2020-04-23 MED ORDER — SPIRONOLACTONE 25 MG PO TABS
25.0000 mg | ORAL_TABLET | Freq: Every day | ORAL | Status: DC
  Administered 2020-04-23 – 2020-05-01 (×9): 25 mg via ORAL
  Filled 2020-04-23 (×9): qty 1

## 2020-04-23 MED ORDER — ONDANSETRON HCL 4 MG/2ML IJ SOLN
4.0000 mg | Freq: Four times a day (QID) | INTRAMUSCULAR | Status: DC | PRN
  Administered 2020-04-28: 16:00:00 4 mg via INTRAVENOUS
  Filled 2020-04-23: qty 2

## 2020-04-23 MED ORDER — ASPIRIN EC 81 MG PO TBEC
81.0000 mg | DELAYED_RELEASE_TABLET | Freq: Every day | ORAL | Status: DC
  Administered 2020-04-23 – 2020-05-01 (×9): 81 mg via ORAL
  Filled 2020-04-23 (×9): qty 1

## 2020-04-23 MED ORDER — TAMSULOSIN HCL 0.4 MG PO CAPS
0.4000 mg | ORAL_CAPSULE | Freq: Every day | ORAL | Status: DC
  Administered 2020-04-23 – 2020-04-30 (×8): 0.4 mg via ORAL
  Filled 2020-04-23 (×8): qty 1

## 2020-04-23 MED ORDER — INSULIN ASPART 100 UNIT/ML ~~LOC~~ SOLN
0.0000 [IU] | SUBCUTANEOUS | Status: DC
  Administered 2020-04-24 – 2020-04-25 (×2): 2 [IU] via SUBCUTANEOUS
  Filled 2020-04-23 (×2): qty 1

## 2020-04-23 MED ORDER — CARVEDILOL 6.25 MG PO TABS
3.1250 mg | ORAL_TABLET | Freq: Two times a day (BID) | ORAL | Status: DC
  Administered 2020-04-24 – 2020-04-30 (×6): 3.125 mg via ORAL
  Filled 2020-04-23 (×10): qty 1

## 2020-04-23 MED ORDER — ACETAMINOPHEN 325 MG PO TABS
650.0000 mg | ORAL_TABLET | ORAL | Status: DC | PRN
  Administered 2020-04-25 – 2020-04-30 (×7): 650 mg via ORAL
  Filled 2020-04-23 (×7): qty 2

## 2020-04-23 MED ORDER — SODIUM CHLORIDE 0.9% FLUSH
3.0000 mL | INTRAVENOUS | Status: DC | PRN
  Administered 2020-04-23: 3 mL via INTRAVENOUS

## 2020-04-23 MED ORDER — SIMVASTATIN 40 MG PO TABS
40.0000 mg | ORAL_TABLET | Freq: Every day | ORAL | Status: DC
  Administered 2020-04-23 – 2020-04-30 (×8): 40 mg via ORAL
  Filled 2020-04-23 (×3): qty 2
  Filled 2020-04-23: qty 1
  Filled 2020-04-23 (×2): qty 2
  Filled 2020-04-23: qty 1
  Filled 2020-04-23 (×2): qty 2

## 2020-04-23 MED ORDER — QUETIAPINE FUMARATE 25 MG PO TABS
25.0000 mg | ORAL_TABLET | Freq: Every day | ORAL | Status: DC
  Administered 2020-04-23 – 2020-04-30 (×8): 25 mg via ORAL
  Filled 2020-04-23 (×8): qty 1

## 2020-04-23 MED ORDER — ENOXAPARIN SODIUM 40 MG/0.4ML ~~LOC~~ SOLN
40.0000 mg | SUBCUTANEOUS | Status: DC
  Administered 2020-04-23 – 2020-04-30 (×8): 40 mg via SUBCUTANEOUS
  Filled 2020-04-23 (×8): qty 0.4

## 2020-04-23 MED ORDER — FUROSEMIDE 10 MG/ML IJ SOLN
40.0000 mg | Freq: Two times a day (BID) | INTRAMUSCULAR | Status: DC
  Administered 2020-04-23 – 2020-04-25 (×5): 40 mg via INTRAVENOUS
  Filled 2020-04-23 (×4): qty 4

## 2020-04-23 MED ORDER — FUROSEMIDE 10 MG/ML IJ SOLN
40.0000 mg | Freq: Once | INTRAMUSCULAR | Status: DC
  Filled 2020-04-23: qty 4

## 2020-04-23 MED ORDER — SODIUM CHLORIDE 0.9% FLUSH
3.0000 mL | Freq: Two times a day (BID) | INTRAVENOUS | Status: DC
  Administered 2020-04-23 – 2020-05-01 (×16): 3 mL via INTRAVENOUS

## 2020-04-23 NOTE — ED Notes (Signed)
Pt given decaf coffee, with creamer and splenda.

## 2020-04-23 NOTE — ED Provider Notes (Signed)
[facility] Emergency Department Provider Note  ____________________________________________   First MD Initiated Contact with Patient 04/23/20 1113     (approximate)  I have reviewed the triage vital signs and the nursing notes.   HISTORY  Chief Complaint Groin Swelling    HPI Trevor Rodriguez. is a 79 y.o. male with EF of 15% on hospice care who comes in for test swelling.  Patient was seen by myself on 4/27.  Patient had a CT scan that was negative for Fournier's gangrene.  Patient was seen the day prior on 4/25 at [facility] had negative ultrasound.  It was felt that this is most likely secondary to fluid overload and patient is been diuresed.  Patient states he has been taking the additional dose of Lasix please continue to have groin swelling that is constant, nothing makes it better, nothing makes it worse.  States that the swelling is so bad that he cannot get around at home.  Patient states that he feels like he needs to come to the hospital for a few days in order to get this better controlled.  He denies any fevers or pain in his groin.          Past Medical History:  Diagnosis Date  . Congestive heart failure (HCC)    Ejection fraction around 15%. He was recently horpitalized for heart failure and had a heart catheterization. He had minor luminal irregularities.  . Hyperlipidemia   . Hypertension     Patient Active Problem List   Diagnosis Date Noted  . Acute systolic HF (heart failure) (HCC) 06/26/2019  . 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 Type II or unspecified type diabetes mellitus without mention of complication, not stated as uncontrolled 02/22/2013  . CHF (congestive heart failure) (HCC) 02/20/2013  . Hyperglycemia 02/20/2013  . Essential hypertension, benign 02/20/2013  . Other and unspecified hyperlipidemia 02/20/2013    Past Surgical History:  Procedure Laterality Date    . CARDIAC CATHETERIZATION  2011  . INCISION AND DRAINAGE ABSCESS N/A 02/20/2013   Procedure: Debridemnt of lower abdominal wall, suprapubic region, scrotum and right thigh;  Surgeon: Wilmon Arms. Corliss Skains, MD;  Location: [facility] OR;  Service: General;  Laterality: N/A;    Prior to Admission medications   Medication Sig Start Date End Date Taking? Authorizing Provider  aspirin 81 MG EC tablet Take 81 mg by mouth daily.     [provider]  Blood Glucose Monitoring Suppl (RELION PRIME MONITOR) DEVI  03/28/13   [provider]  carvedilol (COREG) 3.125 MG tablet TAKE 1 TABLET (3.125 MG TOTAL) BY MOUTH 2 (TWO) TIMES DAILY WITH A MEAL. 12/06/19   Clegg, Amy D, NP  colchicine 0.6 MG tablet Take 0.6 mg by mouth daily.    [provider]  furosemide (LASIX) 40 MG tablet Take 1 tablet (40 mg total) by mouth daily as needed. 11/28/19   Bensimhon, Bevelyn Buckles, MD  losartan (COZAAR) 50 MG tablet Take 50 mg by mouth at bedtime.     [provider]  metFORMIN (GLUCOPHAGE) 500 MG tablet Take 1 tablet (500 mg total) by mouth 2 (two) times daily with a meal. 02/28/13   Cristal Ford, MD  QUEtiapine (SEROQUEL) 25 MG tablet Take 25 mg by mouth at bedtime.    [provider]  RELION PRIME TEST test strip  03/28/13   [provider]  RELION ULTRA THIN PLUS LANCETS MISC  03/28/13   [provider]  sacubitril-valsartan (ENTRESTO) 24-26 MG Take 1 tablet by mouth 2 (two) times daily. Patient not taking: Reported on 03/07/2020 01/05/20   Bensimhon, Bevelyn Buckles, MD  simvastatin (ZOCOR) 40 MG tablet Take 40 mg by mouth at bedtime.     [provider]  spironolactone (ALDACTONE) 25 MG tablet Take 1 tablet (25 mg total) by mouth daily. 02/21/20   Robbie Lis M, PA-C  tamsulosin (FLOMAX) 0.4 MG CAPS capsule Take 0.4 mg by mouth at bedtime.     [provider]    Allergies Ibuprofen  Family History  Problem Relation Age of Onset  . Cancer Mother   . Heart  disease Father     Social History Social History   Tobacco Use  . Smoking status: Former Smoker    Packs/day: 0.50    Types: Cigarettes    Quit date: 03/07/1993    Years since quitting: 27.1  . Smokeless tobacco: Never Used  Substance Use Topics  . Alcohol use: No  . Drug use: No      Review of Systems Constitutional: No fever/chills Eyes: No visual changes. ENT: No sore throat. Cardiovascular: Denies chest pain. Respiratory: Denies shortness of breath. Gastrointestinal: No abdominal pain.  No nausea, no vomiting.  No diarrhea.  No constipation. Genitourinary: Negative for dysuria.  Positive swelling to his testicles Musculoskeletal: Negative for back pain. Skin: Negative for rash. Neurological: Negative for headaches, focal weakness or numbness. All other ROS negative ____________________________________________   PHYSICAL EXAM:  VITAL SIGNS: ED Triage Vitals  Enc Vitals Group     BP 04/23/20 1042 (!) 97/57     Pulse Rate 04/23/20 1042 97     Resp 04/23/20 1042 16     Temp 04/23/20 1042 98.2 F (36.8 C)     Temp Source 04/23/20 1042 Oral     SpO2 04/23/20 1046 98 %     Weight 04/23/20 1044 229 lb (103.9 kg)     Height 04/23/20 1044 5\' 6"  (1.676 m)     Head Circumference --      Peak Flow --      Pain Score 04/23/20 1044 2     Pain Loc --      Pain Edu? --      Excl. in GC? --     Constitutional: Alert and oriented. Well appearing and in no acute distress. Eyes: Conjunctivae are normal. EOMI. Head: Atraumatic. Nose: No congestion/rhinnorhea. Mouth/Throat: Mucous membranes are moist.   Neck: No stridor. Trachea Midline. FROM Cardiovascular: Normal rate, regular rhythm. Grossly normal heart sounds.  Good peripheral circulation. Respiratory: Normal respiratory effort.  No retractions. Lungs CTAB. Gastrointestinal: Soft and nontender. No distention. No abdominal bruits.  Musculoskeletal: No lower extremity tenderness nor edema.  No joint  effusions. Neurologic:  Normal speech and language. No gross focal neurologic deficits are appreciated.  Skin:  Skin is warm, dry and intact. No rash noted. Psychiatric: Mood and affect are normal. Speech and behavior are normal. GU: Swelling noted to his bilateral testicles no erythema, no warmth, Foley catheter in place.  ____________________________________________   LABS (all labs ordered are listed, but only abnormal results are displayed)  Labs Reviewed  CBC WITH DIFFERENTIAL/PLATELET - Abnormal; Notable for the following components:      Result Value   RBC 4.21 (*)    RDW 16.3 (*)    All other components within normal limits  COMPREHENSIVE METABOLIC PANEL - Abnormal; Notable for the following components:  BUN 34 (*)    Creatinine, Ser 1.45 (*)    AST 45 (*)    ALT 78 (*)    Total Bilirubin 1.5 (*)    GFR calc non Af Amer 45 (*)    GFR calc Af Amer 53 (*)    All other components within normal limits  RESPIRATORY PANEL BY RT PCR (FLU A&B, COVID)  URINALYSIS, ROUTINE W REFLEX MICROSCOPIC  BRAIN NATRIURETIC PEPTIDE  HEMOGLOBIN A1C   ____________________________________________   PROCEDURES  Procedure(s) performed (including Critical Care):  Procedures   ____________________________________________   INITIAL IMPRESSION / ASSESSMENT AND PLAN / ED COURSE  Trevor Rodriguez. was evaluated in Emergency Department on 04/23/2020 for the symptoms described in the history of present illness. He was evaluated in the context of the global COVID-19 pandemic, which necessitated consideration that the patient might be at risk for infection with the SARS-CoV-2 virus that causes COVID-19. Institutional protocols and algorithms that pertain to the evaluation of patients at risk for COVID-19 are in a state of rapid change based on information released by regulatory bodies including the CDC and federal and state organizations. These policies and algorithms were followed during the  patient's care in the ED.    Patient is a well-appearing 79 year old on hospice care who comes in with continued testicle swelling.  Is been going on for some time now he is this is his third ER visit.  He has been increasing his diuresis without much effect.  He is continued to have testicle swelling.  Work-up previously has not shown any evidence of Fournier's gangrene, epididymitis, UTI.  Catheter was placed last time for comfort.  I was when he did see patient last time and I did think that the swelling today is worse.  Will get labs evaluate for Electra abnormalities, worsening AKI.  Will give a dose of IV diuresis.  Patient is requesting admission stating that he cannot get around due to the swelling.  We discussed that the neck step would be admission for IV diuresis if that is what he would want.  Will discuss with hospital team for admission       ____________________________________________   FINAL CLINICAL IMPRESSION(S) / ED DIAGNOSES   Final diagnoses:  Testicle swelling  AKI (acute kidney injury) (HCC)  Acute on chronic congestive heart failure, unspecified heart failure type (HCC)      MEDICATIONS GIVEN DURING THIS VISIT:  Medications  aspirin EC tablet 81 mg (has no administration in time range)  carvedilol (COREG) tablet 3.125 mg (has no administration in time range)  simvastatin (ZOCOR) tablet 40 mg (has no administration in time range)  spironolactone (ALDACTONE) tablet 25 mg (has no administration in time range)  QUEtiapine (SEROQUEL) tablet 25 mg (has no administration in time range)  tamsulosin (FLOMAX) capsule 0.4 mg (has no administration in time range)  sodium chloride flush (NS) 0.9 % injection 3 mL (has no administration in time range)  sodium chloride flush (NS) 0.9 % injection 3 mL (has no administration in time range)  0.9 %  sodium chloride infusion (has no administration in time range)  acetaminophen (TYLENOL) tablet 650 mg (has no administration in  time range)  ondansetron (ZOFRAN) injection 4 mg (has no administration in time range)  enoxaparin (LOVENOX) injection 40 mg (has no administration in time range)  furosemide (LASIX) injection 40 mg (has no administration in time range)  insulin aspart (novoLOG) injection 0-15 Units (has no administration in time range)     ED  Discharge Orders    None       Note:  This document was prepared using Dragon voice recognition software and may include unintentional dictation errors.   Vanessa Rattan, MD 04/23/20 1344

## 2020-04-23 NOTE — TOC Initial Note (Signed)
Transition of Care (TOC) - Initial/Assessment Note    Patient Details  Name: Trevor Rodriguez. MRN: [id number] Date of Birth: 1941-05-27  Transition of Care [facility]) CM/SW Contact:    Heeney Cellar, RN Phone Number: 04/23/2020, 3:23 PM  Clinical Narrative:                 Spoke with daughter who expressed concern for patient returning home and prefers short term SNF placement. Patient lives alone and has been followed by Hospice services with an aide coming for ADL assistance and daughter providing support as needed. Daughter is scheduled for a hip replacement and has been struggling to assist patient with needs. TOC RN CM spoke with [facility] @ Hospice regarding possible SNF placement. Will need to reevaluate patient once medically stable for potential to discharge home compared to short term SNF placement.   Expected Discharge Plan: Skilled Nursing Facility     Patient Goals and CMS Choice Patient states their goals for this hospitalization and ongoing recovery are:: Get placed somewhere for help      Expected Discharge Plan and Services Expected Discharge Plan: Skilled Nursing Facility       Living arrangements for the past 2 months: Single Family Home                                      Prior Living Arrangements/Services Living arrangements for the past 2 months: Single Family Home Lives with:: Self Patient language and need for interpreter reviewed:: Yes Do you feel safe going back to the place where you live?: No   Needing more assistance  Need for Family Participation in Patient Care: Yes (Comment) Care giver support system in place?: Yes (comment) Current home services: DME(Cane, walker) Criminal Activity/Legal Involvement Pertinent to Current Situation/Hospitalization: No - Comment as needed  Activities of Daily Living      Permission Sought/Granted Permission sought to share information with : Case Manager Permission granted to share information with :  Yes, Verbal Permission Granted  Share Information with NAME: TOC Department           Emotional Assessment Appearance:: Appears stated age Attitude/Demeanor/Rapport: Engaged Affect (typically observed): Accepting Orientation: : Oriented to Self, Oriented to Place, Oriented to  Time, Oriented to Situation   Psych Involvement: No (comment)  Admission diagnosis:  CHF (congestive heart failure) (HCC) [I50.9] Patient Active Problem List   Diagnosis Date Noted  . Acute systolic HF (heart failure) (HCC) 06/26/2019  . 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 Type II or unspecified type diabetes mellitus without mention of complication, not stated as uncontrolled 02/22/2013  . CHF (congestive heart failure) (HCC) 02/20/2013  . Hyperglycemia 02/20/2013  . Essential hypertension, benign 02/20/2013  . Other and unspecified hyperlipidemia 02/20/2013   PCP:  Marletta Lor, NP Pharmacy:   [facility] [phone] - [address], [place] - [address] [place] [address] Phone: [phone] Fax: [phone]     Social Determinants of Health (SDOH) Interventions    Readmission Risk Interventions No flowsheet data found.

## 2020-04-23 NOTE — ED Triage Notes (Signed)
Pt here with c/o testicular swelling for the past few days, slight sting of pain with urination, has indwelling catheter, states it was placed last week. Denies cp, shob, NAD.

## 2020-04-23 NOTE — ED Notes (Signed)
Pt given dinner meal tray

## 2020-04-23 NOTE — H&P (Signed)
History and Physical    Blima Ledger. QIO:[id number] DOB: January 26, 1941 DOA: 04/23/2020  PCP: Marletta Lor, NP  Patient coming from: home   Chief Complaint: scrotal swelling  HPI: Trevor Rodriguez. is a 79 y.o. male with medical history significant of Patient is a 79 year old male with history of hypertension, hyperlipidemia, chronic systolic heart failure with EF of 25 to 30% from 06/2019 presents complaining of scrotal swelling last few days.  She was seen in the ED on 4/27 found with BNP of 2470.  Scrotal ultrasound showed diffuse scrotal wall thickening with bilateral hydrocele likely due to CHF no evidence of torsion no evidence of infection.  He was given IV Lasix and discharged home.  He was recommended to take Lasix 40 mg p.o. twice daily for the next 3 days.  Please note patient is on hospice care.  Today he returns complaining of scrotal swelling.  He tells me he is taking his Lasix.  He does report having shortness of breath but unable to tell me if acutely is worse.  No chest pain.  Does not think his orthopneic.   ED Course: In the ED he is found with creatinine of 1.45, scrotal edema, was given Lasix IV x1.  Covid pending  Review of Systems: All systems reviewed and otherwise negative.    Past Medical History:  Diagnosis Date  . Congestive heart failure (HCC)    Ejection fraction around 15%. He was recently horpitalized for heart failure and had a heart catheterization. He had minor luminal irregularities.  . Hyperlipidemia   . Hypertension     Past Surgical History:  Procedure Laterality Date  . CARDIAC CATHETERIZATION  2011  . INCISION AND DRAINAGE ABSCESS N/A 02/20/2013   Procedure: Debridemnt of lower abdominal wall, suprapubic region, scrotum and right thigh;  Surgeon: Wilmon Arms. Corliss Skains, MD;  Location: [facility] OR;  Service: General;  Laterality: N/A;     reports that he quit smoking about 27 years ago. His smoking use included cigarettes. He smoked 0.50 packs per day. He  has never used smokeless tobacco. He reports that he does not drink alcohol or use drugs.  Allergies  Allergen Reactions  . Ibuprofen Nausea Only    Family History  Problem Relation Age of Onset  . Cancer Mother   . Heart disease Father      Prior to Admission medications   Medication Sig Start Date End Date Taking? Authorizing Provider  aspirin 81 MG EC tablet Take 81 mg by mouth daily.     [provider]  Blood Glucose Monitoring Suppl (RELION PRIME MONITOR) DEVI  03/28/13   [provider]  carvedilol (COREG) 3.125 MG tablet TAKE 1 TABLET (3.125 MG TOTAL) BY MOUTH 2 (TWO) TIMES DAILY WITH A MEAL. 12/06/19   Clegg, Amy D, NP  colchicine 0.6 MG tablet Take 0.6 mg by mouth daily.    [provider]  furosemide (LASIX) 40 MG tablet Take 1 tablet (40 mg total) by mouth daily as needed. 11/28/19   Bensimhon, Bevelyn Buckles, MD  losartan (COZAAR) 50 MG tablet Take 50 mg by mouth at bedtime.     [provider]  metFORMIN (GLUCOPHAGE) 500 MG tablet Take 1 tablet (500 mg total) by mouth 2 (two) times daily with a meal. 02/28/13   Cristal Ford, MD  QUEtiapine (SEROQUEL) 25 MG tablet Take 25 mg by mouth at bedtime.    [provider]  RELION PRIME TEST test strip  03/28/13   [provider]  RELION ULTRA THIN PLUS LANCETS MISC  03/28/13   [provider]  sacubitril-valsartan (ENTRESTO) 24-26 MG Take 1 tablet by mouth 2 (two) times daily. Patient not taking: Reported on 03/07/2020 01/05/20   Bensimhon, Bevelyn Buckles, MD  simvastatin (ZOCOR) 40 MG tablet Take 40 mg by mouth at bedtime.     [provider]  spironolactone (ALDACTONE) 25 MG tablet Take 1 tablet (25 mg total) by mouth daily. 02/21/20   Robbie Lis M, PA-C  tamsulosin (FLOMAX) 0.4 MG CAPS capsule Take 0.4 mg by mouth at bedtime.     [provider]    Physical Exam: Vitals:   04/23/20 1042 04/23/20 1044 04/23/20 1046 04/23/20 1130  BP: (!) 97/57   106/71    Pulse: 97  68 76  Resp: 16     Temp: 98.2 F (36.8 C)     TempSrc: Oral     SpO2:   98% 100%  Weight:  103.9 kg    Height:  5\' 6"  (1.676 m)      Constitutional: NAD, calm, comfortable Vitals:   04/23/20 1042 04/23/20 1044 04/23/20 1046 04/23/20 1130  BP: (!) 97/57   106/71  Pulse: 97  68 76  Resp: 16     Temp: 98.2 F (36.8 C)     TempSrc: Oral     SpO2:   98% 100%  Weight:  103.9 kg    Height:  5\' 6"  (1.676 m)     Eyes:anicteric sclera, eomi ENMT: Mucous membranes are moist.  Neck: normal, supple, no masses, no thyromegaly Respiratory: clear to auscultation bilaterally, no wheezing, no crackles. Normal respiratory effort. No accessory muscle use.  Cardiovascular: Regular rate and rhythm, no murmurs / rubs / gallops. +edema, wrapped with ace band b/l Abdomen: soft  no tenderness, no masses palpated. Bowel sounds positive. GU: foley in place. Scrotal swelling Musculoskeletal:Normal muscle tone.  Skin: Warm dry Neurologic: CN 2-12 grossly intact.  Psychiatric: Normal judgment and insight. Alert and oriented x 3. Normal mood.    Labs on Admission: I have personally reviewed following labs and imaging studies  CBC: Recent Labs  Lab 04/17/20 1526 04/23/20 1206  WBC 3.3* 5.3  NEUTROABS 2.1 4.1  HGB 12.0* 13.5  HCT 36.6* 41.4  MCV 99.7 98.3  PLT 160 177   Basic Metabolic Panel: Recent Labs  Lab 04/17/20 1526 04/23/20 1206  NA 140 139  K 3.5 4.3  CL 103 101  CO2 25 24  GLUCOSE 88 82  BUN 26* 34*  CREATININE 1.18 1.45*  CALCIUM 9.1 9.0   GFR: Estimated Creatinine Clearance: 46.6 mL/min (A) (by C-G formula based on SCr of 1.45 mg/dL (H)). Liver Function Tests: Recent Labs  Lab 04/17/20 1526 04/23/20 1206  AST 17 45*  ALT 15 78*  ALKPHOS 39 44  BILITOT 1.2 1.5*  PROT 6.9 6.8  ALBUMIN 3.8 3.7   No results for input(s): LIPASE, AMYLASE in the last 168 hours. No results for input(s): AMMONIA in the last 168 hours. Coagulation Profile: No results for  input(s): INR, PROTIME in the last 168 hours. Cardiac Enzymes: No results for input(s): CKTOTAL, CKMB, CKMBINDEX, TROPONINI in the last 168 hours. BNP (last 3 results) No results for input(s): PROBNP in the last 8760 hours. HbA1C: No results for input(s): HGBA1C in the last 72 hours. CBG: No results for input(s): GLUCAP in the last 168 hours. Lipid Profile: No results for input(s): CHOL, HDL, LDLCALC, TRIG, CHOLHDL, LDLDIRECT in the last 72  hours. Thyroid Function Tests: No results for input(s): TSH, T4TOTAL, FREET4, T3FREE, THYROIDAB in the last 72 hours. Anemia Panel: No results for input(s): VITAMINB12, FOLATE, FERRITIN, TIBC, IRON, RETICCTPCT in the last 72 hours. Urine analysis:    Component Value Date/Time   COLORURINE YELLOW (A) 04/17/2020 1635   APPEARANCEUR CLEAR (A) 04/17/2020 1635   LABSPEC 1.010 04/17/2020 1635   PHURINE 5.0 04/17/2020 1635   GLUCOSEU NEGATIVE 04/17/2020 1635   HGBUR NEGATIVE 04/17/2020 1635   BILIRUBINUR NEGATIVE 04/17/2020 1635   KETONESUR NEGATIVE 04/17/2020 1635   PROTEINUR NEGATIVE 04/17/2020 1635   UROBILINOGEN 1.0 07/11/2010 1052   NITRITE NEGATIVE 04/17/2020 1635   LEUKOCYTESUR NEGATIVE 04/17/2020 1635    Radiological Exams on Admission: No results found.  EKG: pending  Assessment/Plan Active Problems:   CHF (congestive heart failure) ([place])    1.Acute on chronic systolic HF- Echo with EF 25-30%  +scrotal swelling BNP on 4/27 elevated, will recheck today Start lasix 40mg  iv bid Monitor electrolytes and renal function Pt is on hospice care Hold Entresto, losartan for now as he is getting diuresed and bp on low side Hold beta blk if bp drops further with iv lasix Continue Aldactone  2. BPH-continue tamsulosin 3.  Dyslipidemia continue statins  4. DM type II- on metformin Hold home med Riss, ck fs Carb control diet   DVT prophylaxis: heparin  Code Status: full code per pt  Family Communication: none at bedside    Disposition Plan: back home   Consults called: none  Admission status: observation as he requires <2MN stays    Nolberto Hanlon MD [facility] Pager [phone]   If 7PM-7AM, please contact night-coverage [email or web address] Password [email or web address]  04/23/2020, 1:27 PM

## 2020-04-23 NOTE — ED Notes (Signed)
Son at bedside.

## 2020-04-23 NOTE — ED Notes (Signed)
Pt presents to the ED for groin swelling that started since last Friday. Pt states there is a slight stinging pain when he urinates. Pt does have a urinary catheter in placed.   On assessment, pt has swelling noted to the scrotal and penile area. Denies pain at this time. Skin is warm to the touch. Pt is A&Ox4 and NAD at this time.

## 2020-04-23 NOTE — ED Notes (Signed)
Hospice team at bedside.

## 2020-04-23 NOTE — ED Notes (Addendum)
Spoke with Crystal with [facility] to let them know that the pt checked in the ED.

## 2020-04-23 NOTE — ED Notes (Addendum)
PT repositioned at this time.

## 2020-04-24 LAB — GLUCOSE, CAPILLARY
Glucose-Capillary: 103 mg/dL — ABNORMAL HIGH (ref 70–99)
Glucose-Capillary: 109 mg/dL — ABNORMAL HIGH (ref 70–99)
Glucose-Capillary: 113 mg/dL — ABNORMAL HIGH (ref 70–99)
Glucose-Capillary: 118 mg/dL — ABNORMAL HIGH (ref 70–99)
Glucose-Capillary: 135 mg/dL — ABNORMAL HIGH (ref 70–99)
Glucose-Capillary: 80 mg/dL (ref 70–99)
Glucose-Capillary: 90 mg/dL (ref 70–99)

## 2020-04-24 LAB — BASIC METABOLIC PANEL
Anion gap: 12 (ref 5–15)
BUN: 31 mg/dL — ABNORMAL HIGH (ref 8–23)
CO2: 25 mmol/L (ref 22–32)
Calcium: 8.7 mg/dL — ABNORMAL LOW (ref 8.9–10.3)
Chloride: 101 mmol/L (ref 98–111)
Creatinine, Ser: 1.33 mg/dL — ABNORMAL HIGH (ref 0.61–1.24)
GFR calc Af Amer: 59 mL/min — ABNORMAL LOW (ref >60)
GFR calc non Af Amer: 50 mL/min — ABNORMAL LOW (ref >60)
Glucose, Bld: 84 mg/dL (ref 70–99)
Potassium: 3.7 mmol/L (ref 3.5–5.1)
Sodium: 138 mmol/L (ref 135–145)

## 2020-04-24 LAB — HEMOGLOBIN A1C
Hgb A1c MFr Bld: 5.9 % — ABNORMAL HIGH (ref 4.8–5.6)
Mean Plasma Glucose: 122.63 mg/dL

## 2020-04-24 MED ORDER — CHLORHEXIDINE GLUCONATE CLOTH 2 % EX PADS
6.0000 | MEDICATED_PAD | Freq: Every day | CUTANEOUS | Status: DC
  Administered 2020-04-24 – 2020-05-01 (×5): 6 via TOPICAL

## 2020-04-24 NOTE — Progress Notes (Deleted)
[facility]) hospital liaison RN note for hospitalized hospice patient  Mr. 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 Gilford Rile with [facility]. Patient brought to [facility] on 5.3.21 for complaints of testicular swelling and worsening BLE swelling. Hospice was notified and home care RN reports patient has been taking his Lasix as prescribed. Mr. Holst is admitted with CHF. This is a related hospital admission.  Met with patient at bedside this morning. No family present at this time. Mr. Schear is alert and oriented, though sleepy this morning. He reports he feels better than yesterday and his legs and testicular swelling do seem to be somewhat improved. He denies any pain or SOB. He shares that things are becoming increasingly difficult for him to manage at home and he does desire short term rehab. We discussed he can pursue rehab, however, Medicare will not pay for hospice and rehab simultaneously. If Mr. Spengler chooses to go to a facility for rehab and have Medicare pay for those services, he would choose to revoke his hospice benefit, knowing he can re-enroll in hospice once rehab is completed.  V/S: T 97.5 (oral), BP 112/71, HR 77, RR 12, Sats 96% on room air  Pertinent abnormal lab work:   Ref. Range 04/24/2020 69:62  BASIC METABOLIC PANEL Unknown Rpt (A)  BUN Latest Ref Range: 8 - 23 mg/dL 31 (H)  Creatinine Latest Ref Range: 0.61 - 1.24 mg/dL 1.33 (H)  Calcium Latest Ref Range: 8.9 - 10.3 mg/dL 8.7 (L)  GFR, Est Non African American Latest Ref Range: >60 mL/min 50 (L)  GFR, Est African American Latest Ref Range: >60 mL/min 59 (L)    Ref. Range 04/23/2020 12:06  COMPREHENSIVE METABOLIC PANEL Unknown Rpt (A)  BUN Latest Ref Range: 8 - 23 mg/dL 34 (H)  Creatinine Latest Ref Range: 0.61 - 1.24 mg/dL 1.45 (H)  AST Latest Ref Range: 15 - 41 U/L 45 (H)  ALT Latest Ref Range: 0 - 44  U/L 78 (H)  Total Bilirubin Latest Ref Range: 0.3 - 1.2 mg/dL 1.5 (H)  GFR, Est Non African American Latest Ref Range: >60 mL/min 45 (L)  GFR, Est African American Latest Ref Range: >60 mL/min 53 (L)  B Natriuretic Peptide Latest Ref Range: 0.0 - 100.0 pg/mL 2,714.0 (H)  RBC Latest Ref Range: 4.22 - 5.81 MIL/uL 4.21 (L)  RDW Latest Ref Range: 11.5 - 15.5 % 16.3 (H)   Diagnostics: None completed IV's/PRN's: Lasix changed from po to IV 40 mg Q 12 hours I/O: 240/1325  Assessment/Plan:  Active Problems:   CHF (congestive heart failure) (HCC)   1.Acute on chronic systolic HF- Echo with EF 25-30%  +scrotal swelling BNP on 4/27 elevated, will recheck today Start lasix '40mg'$  iv bid Monitor electrolytes and renal function Pt is on hospice care Hold Entresto, losartan for now as he is getting diuresed and bp on low side Hold beta blk if bp drops further with iv lasix Continue Aldactone  2. BPH-continue tamsulosin 3.  Dyslipidemia continue statins  4. DM type II- on metformin Hold home med Riss, ck fs Carb control diet  Discharge Planning: Pt wishes to pursue short term rehab. Family contact: No family at bedside. Pt is his own Media planner. IDT: updated. Goals of Care: Pt remains a Full Code, clarified yesterday.   Transfer summary and hospice medication list placed on patient's shadow chart.  Please call with any hospice related  questions or concerns.  Thank you. Margaretmary Eddy, BSN, RN [facility] Liaison [phone]

## 2020-04-24 NOTE — Progress Notes (Signed)
bp 99/67, heart rate 93. Per dr. Marylu Lund hold scheduled coreg, okay to give iv lasix. Will continue to monitor closely

## 2020-04-24 NOTE — Progress Notes (Signed)
PROGRESS NOTE    Trevor Rodriguez.  TFT:[id number] DOB: Aug 24, 1941 DOA: 04/23/2020 PCP: Alvester Chou, NP    Brief Narrative:  Trevor Rodriguez. is a 79 y.o. male with medical history significant of Patient is a 79 year old male with history of hypertension, hyperlipidemia, chronic systolic heart failure with EF of 25 to 30% from 06/2019 presents complaining of scrotal swelling last few days.  She was seen in the ED on 4/27 found with BNP of 2470.  Scrotal ultrasound showed diffuse scrotal wall thickening with bilateral hydrocele likely due to CHF no evidence of torsion no evidence of infection.  He was given IV Lasix and discharged home.  He was recommended to take Lasix 40 mg p.o. twice daily for the next 3 days.  Please note patient is on hospice care.  Today he returns complaining of scrotal swelling.  He tells me he is taking his Lasix.  He does report having shortness of breath but unable to tell me if acutely is worse.  No chest pain.  Does not think his orthopneic.    Consultants:     Procedures: Ultrasound  Antimicrobials:       Subjective: Patient denies shortness of breath this AM.  Unable to tell me if scrotal has pain or not.  Objective: Vitals:   04/24/20 0747 04/24/20 1107 04/24/20 1336 04/24/20 1601  BP: 112/71 100/70  99/67  Pulse: 77 79  84  Resp: 12 18    Temp: (!) 97.5 F (36.4 C) 97.6 F (36.4 C)  98.4 F (36.9 C)  TempSrc: Oral Oral  Oral  SpO2: 96% 93% 94% 100%  Weight:      Height:        Intake/Output Summary (Last 24 hours) at 04/24/2020 1749 Last data filed at 04/24/2020 1345 Gross per 24 hour  Intake 960 ml  Output 2100 ml  Net -1140 ml   Filed Weights   04/23/20 1044 04/24/20 0500  Weight: 103.9 kg 100.8 kg    Examination:  General exam: Appears calm and comfortable  Respiratory system: Clear to auscultation. Respiratory effort normal.  No wheezing Cardiovascular system: S1 & S2 heard, RRR. +JVD, murmurs, rubs, gallops or  clicks. Gastrointestinal system: Abdomen is nondistended, soft and nontender. Normal bowel sounds heard. GU: scrotal edema, mildly improving Foley in place Central nervous system: Alert and oriented.  Extremities: +edema b/l, mildly less  Skin: Warm dry Psychiatry: Mood & affect appropriate in current setting.     Data Reviewed: I have personally reviewed following labs and imaging studies  CBC: Recent Labs  Lab 04/23/20 1206  WBC 5.3  NEUTROABS 4.1  HGB 13.5  HCT 41.4  MCV 98.3  PLT 706   Basic Metabolic Panel: Recent Labs  Lab 04/23/20 1206 04/24/20 0706  NA 139 138  K 4.3 3.7  CL 101 101  CO2 24 25  GLUCOSE 82 84  BUN 34* 31*  CREATININE 1.45* 1.33*  CALCIUM 9.0 8.7*   GFR: Estimated Creatinine Clearance: 50.1 mL/min (A) (by C-G formula based on SCr of 1.33 mg/dL (H)). Liver Function Tests: Recent Labs  Lab 04/23/20 1206  AST 45*  ALT 78*  ALKPHOS 44  BILITOT 1.5*  PROT 6.8  ALBUMIN 3.7   No results for input(s): LIPASE, AMYLASE in the last 168 hours. No results for input(s): AMMONIA in the last 168 hours. Coagulation Profile: No results for input(s): INR, PROTIME in the last 168 hours. Cardiac Enzymes: No results for input(s): CKTOTAL, CKMB, CKMBINDEX, TROPONINI in the  last 168 hours. BNP (last 3 results) No results for input(s): PROBNP in the last 8760 hours. HbA1C: Recent Labs    04/24/20 0654  HGBA1C 5.9*   CBG: Recent Labs  Lab 04/24/20 0037 04/24/20 0423 04/24/20 0730 04/24/20 1159 04/24/20 1631  GLUCAP 90 103* 80 118* 113*   Lipid Profile: No results for input(s): CHOL, HDL, LDLCALC, TRIG, CHOLHDL, LDLDIRECT in the last 72 hours. Thyroid Function Tests: No results for input(s): TSH, T4TOTAL, FREET4, T3FREE, THYROIDAB in the last 72 hours. Anemia Panel: No results for input(s): VITAMINB12, FOLATE, FERRITIN, TIBC, IRON, RETICCTPCT in the last 72 hours. Sepsis Labs: No results for input(s): PROCALCITON, LATICACIDVEN in the last  168 hours.  Recent Results (from the past 240 hour(s))  Respiratory Panel by RT PCR (Flu A&B, Covid) - Nasopharyngeal Swab     Status: None   Collection Time: 04/23/20  1:14 PM   Specimen: Nasopharyngeal Swab  Result Value Ref Range Status   SARS Coronavirus 2 by RT PCR NEGATIVE NEGATIVE Final    Comment: (NOTE) SARS-CoV-2 target nucleic acids are NOT DETECTED. The SARS-CoV-2 RNA is generally detectable in upper respiratoy specimens during the acute phase of infection. The lowest concentration of SARS-CoV-2 viral copies this assay can detect is 131 copies/mL. A negative result does not preclude SARS-Cov-2 infection and should not be used as the sole basis for treatment or other patient management decisions. A negative result may occur with  improper specimen collection/handling, submission of specimen other than nasopharyngeal swab, presence of viral mutation(s) within the areas targeted by this assay, and inadequate number of viral copies (<131 copies/mL). A negative result must be combined with clinical observations, patient history, and epidemiological information. The expected result is Negative. Fact Sheet for Patients:  https://www.moore.com/ Fact Sheet for Healthcare Providers:  https://www.young.biz/ This test is not yet ap proved or cleared by the [place] FDA and  has been authorized for detection and/or diagnosis of SARS-CoV-2 by FDA under an Emergency Use Authorization (EUA). This EUA will remain  in effect (meaning this test can be used) for the duration of the COVID-19 declaration under Section 564(b)(1) of the Act, 21 U.S.C. section 360bbb-3(b)(1), unless the authorization is terminated or revoked sooner.    Influenza A by PCR NEGATIVE NEGATIVE Final   Influenza B by PCR NEGATIVE NEGATIVE Final    Comment: (NOTE) The Xpert Xpress SARS-CoV-2/FLU/RSV assay is intended as an aid in  the diagnosis of influenza from  Nasopharyngeal swab specimens and  should not be used as a sole basis for treatment. Nasal washings and  aspirates are unacceptable for Xpert Xpress SARS-CoV-2/FLU/RSV  testing. Fact Sheet for Patients: https://www.moore.com/ Fact Sheet for Healthcare Providers: https://www.young.biz/ This test is not yet approved or cleared by the [place] FDA and  has been authorized for detection and/or diagnosis of SARS-CoV-2 by  FDA under an Emergency Use Authorization (EUA). This EUA will remain  in effect (meaning this test can be used) for the duration of the  Covid-19 declaration under Section 564(b)(1) of the Act, 21  U.S.C. section 360bbb-3(b)(1), unless the authorization is  terminated or revoked. Performed at [facility], [address] [place], [place] [address]          Radiology Studies: No results found.      Scheduled Meds: . aspirin EC  81 mg Oral Daily  . carvedilol  3.125 mg Oral BID WC  . Chlorhexidine Gluconate Cloth  6 each Topical Daily  . enoxaparin (LOVENOX) injection  40 mg Subcutaneous Q24H  . furosemide  40 mg Intravenous Q12H  . insulin aspart  0-15 Units Subcutaneous Q4H  . QUEtiapine  25 mg Oral QHS  . simvastatin  40 mg Oral QHS  . sodium chloride flush  3 mL Intravenous Q12H  . spironolactone  25 mg Oral Daily  . tamsulosin  0.4 mg Oral QHS   Continuous Infusions: . sodium chloride      Assessment & Plan:   Active Problems:   CHF (congestive heart failure) (HCC)   1.Acute on chronic systolic HF- Echo with EF 25-30%  +scrotal swelling- improving slowly BNP very high continue lasix 40mg  iv bid Monitor electrolytes and renal function Pt is on hospice care- but wants to be full code and wants rehab. Hold Entresto, losartan for now as he is getting diuresed and bp on low side Hold beta blk if bp drops further with iv lasix Continue Aldactone  2. BPH-continue tamsulosin 3.   Dyslipidemia continue statins  4. DM type II- on metformin Hold home med Riss, ck fs Carb control diet  5.AKI- likely prerenal -cardiorenal Improving with diuresis Monitor renal function  DVT prophylaxis: heparin  Code Status: full code per pt  Family Communication: none at bedside  Disposition Plan: back home   versus SNF       LOS: 1 day   Time spent: 45 minutes with more than 50% COC    , MD [facility] Pager [phone]  If 7PM-7AM, please contact night-coverage [email or web address] Password [facility] 04/24/2020, 5:49 PM

## 2020-04-24 NOTE — Progress Notes (Signed)
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 Gilford Rile with [facility]. Patient brought to [facility] on 5.3.21 for complaints of testicular swelling and worsening BLE swelling. Hospice was notified and home care RN reports patient has been taking his Lasix as prescribed. Trevor Rodriguez is admitted with CHF. This is a related hospital admission.  Met with patient at bedside this morning. No family present at this time. Trevor Rodriguez is alert and oriented, though sleepy this morning. He reports he feels better than yesterday and his legs and testicular swelling do seem to be somewhat improved. He denies any pain or SOB. He shares that things are becoming increasingly difficult for him to manage at home and he does desire short term rehab. We discussed he can pursue rehab, however, Medicare will not pay for hospice and rehab simultaneously. If Trevor Rodriguez chooses to go to a facility for rehab and have Medicare pay for those services, he would choose to revoke his hospice benefit, knowing he can re-enroll in hospice once rehab is completed.  V/S: T 97.5 (oral), BP 112/71, HR 77, RR 12, Sats 96% on room air  Pertinent abnormal lab work:   Ref. Range 04/24/2020 01:75  BASIC METABOLIC PANEL Unknown Rpt (A)  BUN Latest Ref Range: 8 - 23 mg/dL 31 (H)  Creatinine Latest Ref Range: 0.61 - 1.24 mg/dL 1.33 (H)  Calcium Latest Ref Range: 8.9 - 10.3 mg/dL 8.7 (L)  GFR, Est Non African American Latest Ref Range: >60 mL/min 50 (L)  GFR, Est African American Latest Ref Range: >60 mL/min 59 (L)    Ref. Range 04/23/2020 12:06  COMPREHENSIVE METABOLIC PANEL Unknown Rpt (A)  BUN Latest Ref Range: 8 - 23 mg/dL 34 (H)  Creatinine Latest Ref Range: 0.61 - 1.24 mg/dL 1.45 (H)  AST Latest Ref Range: 15 - 41 U/L 45 (H)  ALT Latest Ref Range: 0 - 44  U/L 78 (H)  Total Bilirubin Latest Ref Range: 0.3 - 1.2 mg/dL 1.5 (H)  GFR, Est Non African American Latest Ref Range: >60 mL/min 45 (L)  GFR, Est African American Latest Ref Range: >60 mL/min 53 (L)  B Natriuretic Peptide Latest Ref Range: 0.0 - 100.0 pg/mL 2,714.0 (H)  RBC Latest Ref Range: 4.22 - 5.81 MIL/uL 4.21 (L)  RDW Latest Ref Range: 11.5 - 15.5 % 16.3 (H)   Diagnostics: None completed IV's/PRN's: Lasix changed from po to IV 40 mg Q 12 hours I/O: 240/1325  Assessment/Plan:  Active Problems:   CHF (congestive heart failure) (HCC)   1.Acute on chronic systolic HF- Echo with EF 25-30%  +scrotal swelling BNP on 4/27 elevated, will recheck today Start lasix '40mg'$  iv bid Monitor electrolytes and renal function Pt is on hospice care Hold Entresto, losartan for now as he is getting diuresed and bp on low side Hold beta blk if bp drops further with iv lasix Continue Aldactone  2. BPH-continue tamsulosin 3.  Dyslipidemia continue statins  4. DM type II- on metformin Hold home med Riss, ck fs Carb control diet  Discharge Planning: Pt wishes to pursue short term rehab. Family contact: No family at bedside. Pt is his own Media planner. IDT: updated. Goals of Care: Pt remains a Full Code, clarified yesterday.   Please call with any hospice related questions or concerns.  Thank you. Margaretmary Eddy, BSN, RN [facility]  Liaison [phone]

## 2020-04-24 NOTE — Plan of Care (Signed)
  Problem: Pain Managment: Goal: General experience of comfort will improve Outcome: Completed/Met

## 2020-04-24 NOTE — Progress Notes (Signed)
Patient c/o SOB on and off, requesting breathing treatment. Currently nothing ordered. Lungs clear/diminished. Sat  94%on ra. Made dr. Marylu Lund aware, per md place patient on 02 via [place]. No other orders received. Will continue to monitor closely

## 2020-04-24 NOTE — Progress Notes (Signed)
Sent Dr. Marylu Lund a message asking for wound consult patient has bilaterally leg ulcer. Also asked about patients code status, patient is on full code however per note patient is a hospice patient and foley for end of life care. Waiting for md to return page to speak with her more in depth

## 2020-04-25 DIAGNOSIS — I509 Heart failure, unspecified: Secondary | ICD-10-CM

## 2020-04-25 DIAGNOSIS — I10 Essential (primary) hypertension: Secondary | ICD-10-CM

## 2020-04-25 DIAGNOSIS — N5089 Other specified disorders of the male genital organs: Secondary | ICD-10-CM

## 2020-04-25 DIAGNOSIS — N179 Acute kidney failure, unspecified: Secondary | ICD-10-CM

## 2020-04-25 LAB — GLUCOSE, CAPILLARY
Glucose-Capillary: 100 mg/dL — ABNORMAL HIGH (ref 70–99)
Glucose-Capillary: 114 mg/dL — ABNORMAL HIGH (ref 70–99)
Glucose-Capillary: 120 mg/dL — ABNORMAL HIGH (ref 70–99)
Glucose-Capillary: 131 mg/dL — ABNORMAL HIGH (ref 70–99)
Glucose-Capillary: 87 mg/dL (ref 70–99)
Glucose-Capillary: 91 mg/dL (ref 70–99)

## 2020-04-25 LAB — CBC
HCT: 38 % — ABNORMAL LOW (ref 39.0–52.0)
Hemoglobin: 12.9 g/dL — ABNORMAL LOW (ref 13.0–17.0)
MCH: 32.7 pg (ref 26.0–34.0)
MCHC: 33.9 g/dL (ref 30.0–36.0)
MCV: 96.2 fL (ref 80.0–100.0)
Platelets: 172 10*3/uL (ref 150–400)
RBC: 3.95 MIL/uL — ABNORMAL LOW (ref 4.22–5.81)
RDW: 16.6 % — ABNORMAL HIGH (ref 11.5–15.5)
WBC: 4.2 10*3/uL (ref 4.0–10.5)
nRBC: 0 % (ref 0.0–0.2)

## 2020-04-25 LAB — BASIC METABOLIC PANEL
Anion gap: 11 (ref 5–15)
BUN: 30 mg/dL — ABNORMAL HIGH (ref 8–23)
CO2: 24 mmol/L (ref 22–32)
Calcium: 8.5 mg/dL — ABNORMAL LOW (ref 8.9–10.3)
Chloride: 102 mmol/L (ref 98–111)
Creatinine, Ser: 1.48 mg/dL — ABNORMAL HIGH (ref 0.61–1.24)
GFR calc Af Amer: 51 mL/min — ABNORMAL LOW (ref >60)
GFR calc non Af Amer: 44 mL/min — ABNORMAL LOW (ref >60)
Glucose, Bld: 98 mg/dL (ref 70–99)
Potassium: 4 mmol/L (ref 3.5–5.1)
Sodium: 137 mmol/L (ref 135–145)

## 2020-04-25 MED ORDER — FUROSEMIDE 10 MG/ML IJ SOLN: 40.0000 mg | Freq: Every day | INTRAMUSCULAR | Status: DC

## 2020-04-25 NOTE — Progress Notes (Signed)
[facility]) hospital liaison RN note for hospitalized hospice patient  Mr. 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 Gilford Rile with [facility]. Patient brought to [facility] on 5.3.21 for complaints of testicular swelling and worsening BLE swelling. Hospice was notified and home care RN reports patient has been taking his Lasix as prescribed. Mr. Habibi is admitted with CHF. This is a related hospital admission.  Met with patient in room this morning. He is sitting up in chair, reading a bible. He denies any pain, he feels his swelling is slightly improved. He seems a bit anxious and states "he needs to get out of this room". Requesting to change rooms or be rolled in to hallway. Reported to NT, Abigail.   V/S: T 97.5 oral, BP 107/80, HR 98, RR 18, Sats 100% on 2 lpm [place]  Pertinent Abnormal Lab work: Results for ABEER, IVERSEN (MRN [id number]) as of 04/25/2020 14:53  Ref. Range 04/25/2020 79:98  BASIC METABOLIC PANEL Unknown Rpt (A)  BUN Latest Ref Range: 8 - 23 mg/dL 30 (H)  Creatinine Latest Ref Range: 0.61 - 1.24 mg/dL 1.48 (H)  Calcium Latest Ref Range: 8.9 - 10.3 mg/dL 8.5 (L)  GFR, Est Non African American Latest Ref Range: >60 mL/min 44 (L)  GFR, Est African American Latest Ref Range: >60 mL/min 51 (L)  RBC Latest Ref Range: 4.22 - 5.81 MIL/uL 3.95 (L)  Hemoglobin Latest Ref Range: 13.0 - 17.0 g/dL 12.9 (L)  HCT Latest Ref Range: 39.0 - 52.0 % 38.0 (L)  RDW  Latest Ref Range: 11.5 - 15.5 % 16.6 (H)   Diagnostics: None  IV's/PRN: Lasix 40 mg IV Q 12 hours I/O: 1080/1750  Assessment/Plan:  Active Problems:   CHF (congestive heart failure) (HCC)   1.Acute on chronic systolic HF- Echo with EF 25-30%  +scrotal swelling- improving slowly BNP very high continue lasix '40mg'$  iv bid Monitor electrolytes and renal function Pt is on hospice care- but wants to be full code  and wants rehab. Hold Entresto, losartan for now as he is getting diuresed and bp on low side Hold beta blk if bp drops further with iv lasix Continue Aldactone  2. BPH-continue tamsulosin 3.Dyslipidemia continue statins  4. DM type II- on metformin Hold home med Riss, ck fs Carb control diet  5.AKI- likely prerenal -cardiorenal Improving with diuresis Monitor renal function  Discharge Planning: Concerns pt will not be able to manage his care at home by himself. Pt wishes to pursue short term rehab. PT and OT evaluated today and recommend return home with PT services. Have requested short term PT services to be approved by hospice. Pending.   Family contact: No family at bedside. Pt is his own Media planner. IDT: updated Goals of Care: Pt remains a Full Code.  Please call with any hospice related questions or concerns.  Thank you, Margaretmary Eddy, BSN, RN [facility] Liaison [phone]

## 2020-04-25 NOTE — Progress Notes (Signed)
Patient ambulated halfway around the nurses station on 3L of 02 via [place] with a walker. Patient became short of breath and made multiple stops. Patient very determined to stay active.

## 2020-04-25 NOTE — Consult Note (Addendum)
WOC Nurse Consult Note: Reason for Consult: Consult requested for bilat legs.  Pt states he developed generalized edema and blistering earlier this week.  Wounds evolved to full thickness when blisters ruptured.  Left outer calf 3X3X.1cm, red and moist, small amt yellow drainage. Left inner calf 7X7X.1cm, red and moist, small amt yellow drainage. Right inner calf 2X2X.1cm, red and moist, small amt yellow drainage. Dressing procedure/placement/frequency: Topical treatment orders provided for bedside nurses to perform as follows to promote healing: Foam dressing to bilat leg wounds, change Q 3 days or PRN soiling. Please re-consult if further assistance is needed.  Thank-you,  Cammie Mcgee MSN, RN, CWOCN, [place], CNS [phone]

## 2020-04-25 NOTE — TOC Progression Note (Signed)
Transition of Care (TOC) - Progression Note    Patient Details  Name: Trevor Rodriguez. MRN: [id number] Date of Birth: [birth year]  Transition of Care [facility]) CM/SW Contact  Maree Krabbe, LCSW Phone Number: 04/25/2020, 2:54 PM  Clinical Narrative:  Pt is already receiving hospice services at home. French Ana with [facility] is going to see if she can get a PT as well.     Expected Discharge Plan: Skilled Nursing Facility    Expected Discharge Plan and Services Expected Discharge Plan: Skilled Nursing Facility       Living arrangements for the past 2 months: Single Family Home                                       Social Determinants of Health (SDOH) Interventions    Readmission Risk Interventions No flowsheet data found.

## 2020-04-25 NOTE — Evaluation (Signed)
Physical Therapy Evaluation Patient Details Name: Trevor Rodriguez. MRN: [id number] DOB: [birth year] Today's Date: 04/25/2020   History of Present Illness  presented to ER secondary to scrotal swelling, SOB; admitted for management of acute/chronic CHF.  Clinical Impression  Upon evaluation, patient alert and oriented; follows commands and demonstrates good effort with all mobility tasks.  Affect generally flat; intermittently closing eyes, falling asleep during session, but easily arousable/redirectable with cuing.  Bilat UE/LE strength and ROM grossly symmetrical; no focal weakness appreciated.  Able to complete bed mobility with mod indep; sit/stand, basic transfers and gait (40') with RW, cga/min assist.  Demonstrates broad BOS with fair step height/length; mild forward flexed posture with mod WBing bilat UEs; no overt buckling or LOB, sats maintained >98% on 2L Would benefit from skilled PT to address above deficits and promote optimal return to PLOF.; Recommend transition to HHPT upon discharge from acute hospitalization.     Follow Up Recommendations Home health PT    Equipment Recommendations  Rolling walker with 5" wheels    Recommendations for Other Services       Precautions / Restrictions Precautions Precautions: Fall Restrictions Weight Bearing Restrictions: No      Mobility  Bed Mobility Overal bed mobility: Modified Independent                Transfers Overall transfer level: Needs assistance Equipment used: Rolling walker (2 wheeled) Transfers: Sit to/from Stand Sit to Stand: Min assist;Min guard         General transfer comment: cuing for hand placement  Ambulation/Gait Ambulation/Gait assistance: Min guard;Min assist Gait Distance (Feet): 40 Feet Assistive device: Rolling walker (2 wheeled)       General Gait Details: broad BOS with fair step height/length; mild forward flexed posture with mod WBing bilat UEs; no overt buckling or LOB, sats  maintained >98% on 2L  Stairs            Wheelchair Mobility    Modified Rankin (Stroke Patients Only)       Balance Overall balance assessment: Needs assistance Sitting-balance support: No upper extremity supported;Feet supported Sitting balance-Leahy Scale: Good     Standing balance support: Bilateral upper extremity supported Standing balance-Leahy Scale: Fair                               Pertinent Vitals/Pain Pain Assessment: No/denies pain    Home Living Family/patient expects to be discharged to:: Private residence Living Arrangements: Alone Available Help at Discharge: Family;Available PRN/intermittently Type of Home: House Home Access: (per patient, having ramp installed?)     Home Layout: One level Home Equipment: Cane - single point      Prior Function Level of Independence: Independent with assistive device(s)         Comments: Mod indep with SPC for ADLs, household and community mobilization; intermittent home O2.  Denies fall history.  Followed by hospice in home.     Hand Dominance   Dominant Hand: Right    Extremity/Trunk Assessment   Upper Extremity Assessment Upper Extremity Assessment: Overall WFL for tasks assessed    Lower Extremity Assessment Lower Extremity Assessment: Overall WFL for tasks assessed(grossly at least 4+/5 throughout; chronic wounds to distal LEs (wrapped with kerlix on eval))       Communication   Communication: No difficulties  Cognition Arousal/Alertness: Awake/alert Behavior During Therapy: Flat affect Overall Cognitive Status: Within Functional Limits for tasks assessed  General Comments: alert and oriented to basic information; follows simple commands; agreeable to participation with session. Intermittent closes eyes during session; easily arousable with verbal cuing.      General Comments      Exercises Other Exercises Other Exercises:  Sit/stand with RW, cga/min assist, from recliner, edge of bed; fair LE strength/power, does require use of UEs to assist with lift off Other Exercises: Bed/chair transfer with RW, cga/min assist   Assessment/Plan    PT Assessment Patient needs continued PT services  PT Problem List Decreased cognition;Decreased balance;Decreased activity tolerance;Decreased mobility;Decreased knowledge of use of DME;Decreased safety awareness;Decreased knowledge of precautions;Cardiopulmonary status limiting activity       PT Treatment Interventions DME instruction;Gait training;Functional mobility training;Balance training;Therapeutic activities;Therapeutic exercise;Patient/family education;Stair training    PT Goals (Current goals can be found in the Care Plan section)  Acute Rehab PT Goals Patient Stated Goal: to get stronger PT Goal Formulation: With patient Time For Goal Achievement: 05/09/20 Potential to Achieve Goals: Good    Frequency Min 2X/week   Barriers to discharge Decreased caregiver support      Co-evaluation               AM-PAC PT "6 Clicks" Mobility  Outcome Measure Help needed turning from your back to your side while in a flat bed without using bedrails?: None Help needed moving from lying on your back to sitting on the side of a flat bed without using bedrails?: None Help needed moving to and from a bed to a chair (including a wheelchair)?: A Little Help needed standing up from a chair using your arms (e.g., wheelchair or bedside chair)?: A Little Help needed to walk in hospital room?: A Little Help needed climbing 3-5 steps with a railing? : A Little 6 Click Score: 20    End of Session Equipment Utilized During Treatment: Gait belt;Oxygen Activity Tolerance: Patient tolerated treatment well Patient left: in chair;with call bell/phone within reach;with chair alarm set Nurse Communication: Mobility status PT Visit Diagnosis: Muscle weakness (generalized)  (M62.81);Difficulty in walking, not elsewhere classified (R26.2)    Time: 9767-3419 PT Time Calculation (min) (ACUTE ONLY): 22 min   Charges:   PT Evaluation $PT Eval Moderate Complexity: 1 Mod PT Treatments $Therapeutic Activity: 8-22 mins       Wendie Diskin H. Owens Shark, PT, DPT, NCS 04/25/20, 10:39 AM [phone]

## 2020-04-25 NOTE — Evaluation (Signed)
Occupational Therapy Evaluation Patient Details Name: Trevor Rodriguez. MRN: [id number] DOB: Jul 11, 1941 Today's Date: 04/25/2020    History of Present Illness presented to ER secondary to scrotal swelling, SOB; admitted for management of acute/chronic CHF.   Clinical Impression   Trevor Rodriguez was seen for OT evaluation this date. Prior to hospital admission, pt was modified independent in all ADL tasks. He reports using a SPC for functional mobility. Pt states he has assistance with driving and shopping from his daughter who lives nearby. Pt lives alone in a 1-level home and is followed by hospice. He states that in his free time he enjoys reading the bible, and at one point repaired bicycles, but has not been able to do this much recently. Currently pt demonstrates impairments as described below (See OT problem list) which functionally limit his ability to perform ADL/self-care tasks. Pt currently requires CGA for functional mobility as well as MIN A for LB ADL management in a seated position to maximize comfort 2/2 increased scrotal edema.  Pt would benefit from skilled OT services to address noted impairments and functional limitations (see below for any additional details) in order to maximize safety and independence while minimizing falls risk and caregiver burden. Upon hospital discharge, recommend HHOT to maximize pt safety and return to functional independence during meaningful occupations of daily life.    Follow Up Recommendations  Home health OT    Equipment Recommendations  3 in 1 bedside commode    Recommendations for Other Services       Precautions / Restrictions Precautions Precautions: Fall Restrictions Weight Bearing Restrictions: No      Mobility Bed Mobility Overal bed mobility: Modified Independent             General bed mobility comments: Deferred. Pt up in recliner at start/end of session. Per notes, pt was MOD I for bed mobility this  date.  Transfers Overall transfer level: Needs assistance Equipment used: 1 person hand held assist Transfers: Sit to/from Stand Sit to Stand: Min guard         General transfer comment: Pt performs STS to reposition self in chair this date. Does not require assist. OT offers 1 hand held assist for stability, but pt does not require physical assist.    Balance Overall balance assessment: Needs assistance Sitting-balance support: No upper extremity supported;Feet supported Sitting balance-Leahy Scale: Good Sitting balance - Comments: Steady static sitting, reaching within BOS   Standing balance support: During functional activity;Single extremity supported Standing balance-Leahy Scale: Fair                             ADL either performed or assessed with clinical judgement   ADL Overall ADL's : Needs assistance/impaired                                       General ADL Comments: Pt presents with decreased activity tolerance and decreased safety awareness this date. Per RN pt noted to "sundown" during the afternoon/evening which may also impact his occupational performance/safety. Currently requires CGA for fxl mobility, set-up/sup for seated ADL management. MIN A for LB dressing 2/2 increased scrotal edema and discomfort with reaching feet.     Vision Baseline Vision/History: Wears glasses Wears Glasses: At all times Patient Visual Report: No change from baseline Additional Comments: Pt reports L eye "is falling asleep faster",  appears able to visuall track appropriately. Can read this authors name badge from ~2' away. Will continue to monitor.     Perception     Praxis      Pertinent Vitals/Pain Pain Assessment: No/denies pain     Hand Dominance Right   Extremity/Trunk Assessment Upper Extremity Assessment Upper Extremity Assessment: Overall WFL for tasks assessed(BUE grosly WFLs 4/5. Decreased FMC 2/2 arthritic changes in BUE Decreased  2nd-4th digit finger extension in BUE. Overall WFLs.)   Lower Extremity Assessment Lower Extremity Assessment: Overall WFL for tasks assessed;Defer to PT evaluation       Communication Communication Communication: No difficulties   Cognition Arousal/Alertness: Awake/alert;Lethargic Behavior During Therapy: Flat affect;WFL for tasks assessed/performed Overall Cognitive Status: Within Functional Limits for tasks assessed                                 General Comments: alert and oriented to basic information (self and place); follows simple commands; agreeable to participation with session. Intermittently closes eyes during session; easily arousable with verbal cuing.   General Comments  Pt noted with BLE wounds wrapped in kerlix this date. Dressings intact at start/end of session.    Exercises Other Exercises Other Exercises: Pt educated on role of OT in acute setting, falls prevention strategies, basic energy conservation including PLB, and safe transfer techniques this date. Other Exercises: Seated grooming tasks including oral care/face washing. Set-up/supervision for safety t/o.   Shoulder Instructions      Home Living Family/patient expects to be discharged to:: Private residence Living Arrangements: Alone Available Help at Discharge: Family;Available PRN/intermittently Type of Home: House Home Access: (Pt states "Just a sidewalk" chart indicates may be having ramp installed?)     Home Layout: One level     Bathroom Shower/Tub: Tub/shower unit;Curtain   Biochemist, clinical: Standard     Home Equipment: Cane - single point          Prior Functioning/Environment Level of Independence: Independent with assistive device(s)        Comments: Mod indep with SPC for ADLs, household and community mobilization; intermittent home O2 (pt states he uses 2L when breathing is difficult).  Denies fall history.  Followed by hospice in home.        OT Problem  List: Decreased strength;Decreased coordination;Decreased range of motion;Decreased activity tolerance;Decreased safety awareness;Decreased knowledge of use of DME or AE;Cardiopulmonary status limiting activity;Decreased cognition;Impaired balance (sitting and/or standing)      OT Treatment/Interventions: Self-care/ADL training;Therapeutic exercise;Therapeutic activities;DME and/or AE instruction;Patient/family education;Balance training;Energy conservation    OT Goals(Current goals can be found in the care plan section) Acute Rehab OT Goals Patient Stated Goal: to get stronger OT Goal Formulation: With patient Time For Goal Achievement: 05/09/20 Potential to Achieve Goals: Good ADL Goals Pt Will Perform Grooming: standing;with modified independence(LRAD PRN for safety and fxl independence.) Pt Will Perform Lower Body Dressing: sit to/from stand;with set-up;with supervision(LRAD PRN for safety and fxl independence.) Pt Will Transfer to Toilet: ambulating;with modified independence(LRAD PRN for safety and fxl independence.) Pt Will Perform Toileting - Clothing Manipulation and hygiene: sit to/from stand;with modified independence(LRAD PRN for safety and fxl independence.)  OT Frequency: Min 2X/week   Barriers to D/C: Decreased caregiver support          Co-evaluation              AM-PAC OT "6 Clicks" Daily Activity     Outcome Measure Help from  another person eating meals?: None Help from another person taking care of personal grooming?: A Little Help from another person toileting, which includes using toliet, bedpan, or urinal?: A Little Help from another person bathing (including washing, rinsing, drying)?: A Little Help from another person to put on and taking off regular upper body clothing?: None Help from another person to put on and taking off regular lower body clothing?: A Little 6 Click Score: 20   End of Session    Activity Tolerance: Patient tolerated treatment  well Patient left: in chair;with call bell/phone within reach;with chair alarm set  OT Visit Diagnosis: Other abnormalities of gait and mobility (R26.89)                Time: 8022-3361 OT Time Calculation (min): 19 min Charges:  OT General Charges $OT Visit: 1 Visit OT Evaluation $OT Eval Moderate Complexity: 1 Mod OT Treatments $Self Care/Home Management : 8-22 mins  Rockney Ghee, M.S., OTR/L Ascom: [phone] 04/25/20, 11:51 AM

## 2020-04-25 NOTE — Progress Notes (Signed)
PROGRESS NOTE    Trevor Rodriguez.  WUJ:[id number] DOB: 13-Feb-1941 DOA: 04/23/2020 PCP: Alvester Chou, NP   Brief Narrative:  Trevor Rodriguez.is a 79 y.o.malewith medical history significant ofPatient is a 79 year old male with history of hypertension, hyperlipidemia,chronic systolic heart failure with EF of 25 to 30% from 06/2019 presents complaining of scrotal swellinglast few days.She was seen in the ED on 4/27 found with BNP of 2470. Scrotal ultrasound showed diffuse scrotal wall thickening with bilateral hydrocele likely due to CHF no evidence of torsion no evidence of infection. He was given IV Lasix and discharged home. He was recommended to take Lasix 40 mg p.o. twice daily for the next 3 days. Please note patient is on hospice care. He tells that he is taking his Lasix. He does report having shortness of breath but unable to tell me if acutely is worse. No chest pain. Does not think his orthopneic.  Subjective: No new complaints.  Think that his shortness of breath is little better and wants to ambulate today.  Scrotal swelling improving.  Assessment & Plan:   Active Problems:   CHF (congestive heart failure) (HCC)  Acute on chronic systolic heart failure.  Echo with EF of 25 to 30%. Symptoms seem improving, scrotum still swollen, per patient also improving. Weight decreased to 190.7 pound from 222 on admission. Patient is under hospice care but wants to stay full code and would like to get some rehab. -PT/OT are recommending home health services.  Social worker will ask hospice if they can provide PT services. -Continue IV diuresis for another day. -Patient with Foley catheter due to extensive penile swelling, we will try to remove catheter tomorrow and gave him a voiding trial. -Continue Aldactone. -Continue to hold Coreg and Entresto due to softer blood pressure and he is getting IV diuresis.   BPH. -Continue tamsulosin.  Type 2 diabetes mellitus.  Well  controlled with A1c of 5.9. -Continue carb modified diet and SSI.  AKI.  Most likely prerenal.  Mild worsening of creatinine today. -IV Lasix from twice daily to daily. -Monitor renal function. -Avoid nephrotoxins.   Objective: Vitals:   04/25/20 0351 04/25/20 0750 04/25/20 1204 04/25/20 1622  BP: 100/76 107/80 93/68 97/72   Pulse: 85 98 88 85  Resp: 19 18 18 17   Temp: (!) 97.5 F (36.4 C) (!) 97.5 F (36.4 C) (!) 97.5 F (36.4 C) (!) 97.5 F (36.4 C)  TempSrc: Oral Oral Oral   SpO2: 99% 100% 100% 100%  Weight:      Height:        Intake/Output Summary (Last 24 hours) at 04/25/2020 1719 Last data filed at 04/25/2020 1300 Gross per 24 hour  Intake 960 ml  Output 1400 ml  Net -440 ml   Filed Weights   04/23/20 1044 04/24/20 0500 04/25/20 0347  Weight: 103.9 kg 100.8 kg 86.5 kg    Examination:  General exam: Appears calm and comfortable  Respiratory system: Clear to auscultation. Respiratory effort normal. Cardiovascular system: S1 & S2 heard, RRR. No JVD, murmurs, rubs, gallops or clicks. Gastrointestinal system: Soft, nontender, nondistended, bowel sounds positive. Central nervous system: Alert and oriented. No focal neurological deficits. GU.  Significant scrotal edema Extremities: 2+ LE edema, no cyanosis, pulses intact and symmetrical. Psychiatry: Judgement and insight appear normal.     DVT prophylaxis: Heparin Code Status: Full Family Communication: Discussed with patient. Disposition Plan:  Status is: Inpatient  Remains inpatient appropriate because:IV treatments appropriate due to intensity of illness or inability  to take PO   Dispo: The patient is from: Home              Anticipated d/c is to: Home              Anticipated d/c date is: 2 days              Patient currently is not medically stable to d/c.  Patient with volume overload requiring IV Lasix.  Consultants:   None  Procedures:  [place]  Antimicrobials:   Data Reviewed: I have personally  reviewed following labs and imaging studies  CBC: Recent Labs  Lab 04/23/20 1206 04/25/20 0611  WBC 5.3 4.2  NEUTROABS 4.1  --   HGB 13.5 12.9*  HCT 41.4 38.0*  MCV 98.3 96.2  PLT 177 172   Basic Metabolic Panel: Recent Labs  Lab 04/23/20 1206 04/24/20 0706 04/25/20 0611  NA 139 138 137  K 4.3 3.7 4.0  CL 101 101 102  CO2 24 25 24   GLUCOSE 82 84 98  BUN 34* 31* 30*  CREATININE 1.45* 1.33* 1.48*  CALCIUM 9.0 8.7* 8.5*   GFR: Estimated Creatinine Clearance: 41.7 mL/min (A) (by C-G formula based on SCr of 1.48 mg/dL (H)). Liver Function Tests: Recent Labs  Lab 04/23/20 1206  AST 45*  ALT 78*  ALKPHOS 44  BILITOT 1.5*  PROT 6.8  ALBUMIN 3.7   No results for input(s): LIPASE, AMYLASE in the last 168 hours. No results for input(s): AMMONIA in the last 168 hours. Coagulation Profile: No results for input(s): INR, PROTIME in the last 168 hours. Cardiac Enzymes: No results for input(s): CKTOTAL, CKMB, CKMBINDEX, TROPONINI in the last 168 hours. BNP (last 3 results) No results for input(s): PROBNP in the last 8760 hours. HbA1C: Recent Labs    04/24/20 0654  HGBA1C 5.9*   CBG: Recent Labs  Lab 04/24/20 2328 04/25/20 0345 04/25/20 0752 04/25/20 1157 04/25/20 1640  GLUCAP 109* 114* 91 100* 87   Lipid Profile: No results for input(s): CHOL, HDL, LDLCALC, TRIG, CHOLHDL, LDLDIRECT in the last 72 hours. Thyroid Function Tests: No results for input(s): TSH, T4TOTAL, FREET4, T3FREE, THYROIDAB in the last 72 hours. Anemia Panel: No results for input(s): VITAMINB12, FOLATE, FERRITIN, TIBC, IRON, RETICCTPCT in the last 72 hours. Sepsis Labs: No results for input(s): PROCALCITON, LATICACIDVEN in the last 168 hours.  Recent Results (from the past 240 hour(s))  Respiratory Panel by RT PCR (Flu A&B, Covid) - Nasopharyngeal Swab     Status: None   Collection Time: 04/23/20  1:14 PM   Specimen: Nasopharyngeal Swab  Result Value Ref Range Status   SARS Coronavirus 2  by RT PCR NEGATIVE NEGATIVE Final    Comment: (NOTE) SARS-CoV-2 target nucleic acids are NOT DETECTED. The SARS-CoV-2 RNA is generally detectable in upper respiratoy specimens during the acute phase of infection. The lowest concentration of SARS-CoV-2 viral copies this assay can detect is 131 copies/mL. A negative result does not preclude SARS-Cov-2 infection and should not be used as the sole basis for treatment or other patient management decisions. A negative result may occur with  improper specimen collection/handling, submission of specimen other than nasopharyngeal swab, presence of viral mutation(s) within the areas targeted by this assay, and inadequate number of viral copies (<131 copies/mL). A negative result must be combined with clinical observations, patient history, and epidemiological information. The expected result is Negative. Fact Sheet for Patients:  06/23/20 Fact Sheet for Healthcare Providers:  https://www.moore.com/ This test is not  yet ap proved or cleared by the [place] and  has been authorized for detection and/or diagnosis of SARS-CoV-2 by FDA under an Emergency Use Authorization (EUA). This EUA will remain  in effect (meaning this test can be used) for the duration of the COVID-19 declaration under Section 564(b)(1) of the Act, 21 U.S.C. section 360bbb-3(b)(1), unless the authorization is terminated or revoked sooner.    Influenza A by PCR NEGATIVE NEGATIVE Final   Influenza B by PCR NEGATIVE NEGATIVE Final    Comment: (NOTE) The Xpert Xpress SARS-CoV-2/FLU/RSV assay is intended as an aid in  the diagnosis of influenza from Nasopharyngeal swab specimens and  should not be used as a sole basis for treatment. Nasal washings and  aspirates are unacceptable for Xpert Xpress SARS-CoV-2/FLU/RSV  testing. Fact Sheet for Patients: https://www.moore.com/ Fact Sheet for Healthcare  Providers: https://www.young.biz/ This test is not yet approved or cleared by the [place] FDA and  has been authorized for detection and/or diagnosis of SARS-CoV-2 by  FDA under an Emergency Use Authorization (EUA). This EUA will remain  in effect (meaning this test can be used) for the duration of the  Covid-19 declaration under Section 564(b)(1) of the Act, 21  U.S.C. section 360bbb-3(b)(1), unless the authorization is  terminated or revoked. Performed at [facility], [address], [place], [place] [address]      Radiology Studies: No results found.  Scheduled Meds: . aspirin EC  81 mg Oral Daily  . carvedilol  3.125 mg Oral BID WC  . Chlorhexidine Gluconate Cloth  6 each Topical Daily  . enoxaparin (LOVENOX) injection  40 mg Subcutaneous Q24H  . furosemide  40 mg Intravenous Q12H  . insulin aspart  0-15 Units Subcutaneous Q4H  . QUEtiapine  25 mg Oral QHS  . simvastatin  40 mg Oral QHS  . sodium chloride flush  3 mL Intravenous Q12H  . spironolactone  25 mg Oral Daily  . tamsulosin  0.4 mg Oral QHS   Continuous Infusions: . sodium chloride       LOS: 2 days   Time spent:45  Minutes.  Arnetha Courser, MD [facility]  If 7PM-7AM, please contact night-coverage [email or web address]  04/25/2020, 5:19 PM   This record has been created using Conservation officer, historic buildings. Errors have been sought and corrected,but may not always be located. Such creation errors do not reflect on the standard of care.

## 2020-04-26 LAB — GLUCOSE, CAPILLARY
Glucose-Capillary: 101 mg/dL — ABNORMAL HIGH (ref 70–99)
Glucose-Capillary: 76 mg/dL (ref 70–99)
Glucose-Capillary: 82 mg/dL (ref 70–99)
Glucose-Capillary: 88 mg/dL (ref 70–99)
Glucose-Capillary: 107 mg/dL — ABNORMAL HIGH (ref 70–99)

## 2020-04-26 LAB — CBC
HCT: 40.1 % (ref 39.0–52.0)
Hemoglobin: 13.3 g/dL (ref 13.0–17.0)
MCH: 32.4 pg (ref 26.0–34.0)
MCHC: 33.2 g/dL (ref 30.0–36.0)
MCV: 97.8 fL (ref 80.0–100.0)
Platelets: 169 10*3/uL (ref 150–400)
RBC: 4.1 MIL/uL — ABNORMAL LOW (ref 4.22–5.81)
RDW: 16.4 % — ABNORMAL HIGH (ref 11.5–15.5)
WBC: 4.1 10*3/uL (ref 4.0–10.5)
nRBC: 0 % (ref 0.0–0.2)

## 2020-04-26 LAB — BASIC METABOLIC PANEL
Anion gap: 12 (ref 5–15)
BUN: 35 mg/dL — ABNORMAL HIGH (ref 8–23)
CO2: 26 mmol/L (ref 22–32)
Calcium: 8.7 mg/dL — ABNORMAL LOW (ref 8.9–10.3)
Chloride: 99 mmol/L (ref 98–111)
Creatinine, Ser: 1.59 mg/dL — ABNORMAL HIGH (ref 0.61–1.24)
GFR calc Af Amer: 47 mL/min — ABNORMAL LOW (ref >60)
GFR calc non Af Amer: 41 mL/min — ABNORMAL LOW (ref >60)
Glucose, Bld: 99 mg/dL (ref 70–99)
Potassium: 3.9 mmol/L (ref 3.5–5.1)
Sodium: 137 mmol/L (ref 135–145)

## 2020-04-26 MED ORDER — TORSEMIDE 20 MG PO TABS
40.0000 mg | ORAL_TABLET | Freq: Every day | ORAL | Status: DC
  Administered 2020-04-27: 09:00:00 40 mg via ORAL
  Filled 2020-04-26: qty 2

## 2020-04-26 NOTE — Progress Notes (Signed)
[facility]) hospital liaison RN note for hospitalized hospice patient  Mr. 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 Gilford Rile with [facility]. Patient brought to [facility] on 5.3.21 for complaints of testicular swelling and worsening BLE swelling. Hospice was notified and home care RN reports patient has been taking his Lasix as prescribed. Mr. Dy is admitted with CHF. This is a related hospital admission.  Met with patient in room. He is sitting up in bedside chair, sleeping peacefully. He arouses to voice. Denies any pain or SOB. He is anxious for catheter to be removed. Swelling of legs and scrotum are greatly improved. We discussed his desire to go to rehab for short term vs going home. We also discussed ongoing conversations he feels needs to be had with daughter. He states he is accepting of his diagnosis and prognosis, though his daughter is not. Assured him that his hospice team can assist with these conversations to allow his voice and thoughts to be heard. He is also requesting dietary information about eating a diet best for his disease process. Notified Dr. Reesa Chew.  V/S: T: 97.7, BP 100/74, P 84, RR 19, Sats 96% on 4 lpm   Pertinent Abnormal Lab work: Results for FENDER, HERDER (MRN [id number]) as of 04/26/2020 12:36  Ref. Range 04/26/2020 56:81  BASIC METABOLIC PANEL Unknown Rpt (A)  BUN Latest Ref Range: 8 - 23 mg/dL 35 (H)  Creatinine Latest Ref Range: 0.61 - 1.24 mg/dL 1.59 (H)  Calcium Latest Ref Range: 8.9 - 10.3 mg/dL 8.7 (L)  GFR, Est Non African American Latest Ref Range: >60 mL/min 41 (L)  GFR, Est African American Latest Ref Range: >60 mL/min 47 (L)  RBC Latest Ref Range: 4.22 - 5.81 MIL/uL 4.10 (L)  RDW Latest Ref Range: 11.5 - 15.5 % 16.4 (H)   Diagnostics: None new IV's/PRN's: IV Lasix was to be decreased to Q Day. Appears to have been discontinued at  this time. Pt has had Tylenol 650 mg po X 2. I/O: 600/1125  Assessment/Plan: Active Problems:   CHF (congestive heart failure) (HCC)  Acute on chronic systolic heart failure.  Echo with EF of 25 to 30%. Symptoms seem improving, scrotum still swollen, per patient also improving. Weight decreased to 190.7 pound from 222 on admission. Patient is under hospice care but wants to stay full code and would like to get some rehab. -PT/OT are recommending home health services.  Social worker will ask hospice if they can provide PT services. -Continue IV diuresis for another day. -Patient with Foley catheter due to extensive penile swelling, we will try to remove catheter tomorrow and gave him a voiding trial. -Continue Aldactone. -Continue to hold Coreg and Entresto due to softer blood pressure and he is getting IV diuresis.   BPH. -Continue tamsulosin.  Type 2 diabetes mellitus.  Well controlled with A1c of 5.9. -Continue carb modified diet and SSI.  AKI.  Most likely prerenal.  Mild worsening of creatinine today. -IV Lasix from twice daily to daily. -Monitor renal function. -Avoid nephrotoxins.  Discharge Planning: Concerns pt will not be able to manage his care at home by himself. Pt wishes to pursue short term rehab. PT and OT evaluated and recommend return home with PT services. Have requested short term PT services to be approved by hospice. Pending.   Family contact: No family at bedside. Pt is his own Media planner. IDT: updated Goals of  Care: Pt remains a Full Code.  Please call with any hospice related questions or concerns.  Thank you, Margaretmary Eddy, BSN, RN [facility] Liaison [phone]

## 2020-04-26 NOTE — Progress Notes (Addendum)
Pulled urinary catheter.  Placed external cath to monitor urination.    Prior to removal-  Urine in bag post flush was clear of clots and yellow in color.

## 2020-04-26 NOTE — Progress Notes (Signed)
PROGRESS NOTE    Trevor Rodriguez.  YQM:[id number] DOB: April 03, 1941 DOA: 04/23/2020 PCP: Marletta Lor, NP   Brief Narrative:  Trevor Rodriguezis a 79 y.o.malewith medical history significant ofPatient is a 79 year old male with history of hypertension, hyperlipidemia,chronic systolic heart failure with EF of 25 to 30% from 06/2019 presents complaining of scrotal swellinglast few days.She was seen in the ED on 4/27 found with BNP of 2470. Scrotal ultrasound showed diffuse scrotal wall thickening with bilateral hydrocele likely due to CHF no evidence of torsion no evidence of infection. He was given IV Lasix and discharged home. He was recommended to take Lasix 40 mg p.o. twice daily for the next 3 days. Please note patient is on hospice care. He tells that he is taking his Lasix. He does report having shortness of breath but unable to tell me if acutely is worse. No chest pain. Does not think his orthopneic.  Subjective: No new complaints.  Per patient he has Foley for some time now, does not know why it was placed.  Talked with daughter and it was placed last week in ED at [facility] due to penile and scrotal edema.  Assessment & Plan:   Active Problems:   CHF (congestive heart failure) (HCC)   AKI (acute kidney injury) (HCC)   Testicle swelling  Acute on chronic systolic heart failure.  Echo with EF of 25 to 30%. Symptoms seem improving, scrotum still swollen, per patient also improving. Weight decreased to 190. pound from 222 on admission. Patient is under hospice care but wants to stay full code and would like to get some rehab. -PT/OT are recommending home health services.  Social worker will ask hospice if they can provide PT services, hospice will not provide PT services, we will see if he qualifies for SNF as he is very much interested in rehab. -Discontinue IV Lasix today due to worsening creatinine. -We will start p.o. torsemide from tomorrow -Continue to hold Coreg  and Entresto due to softer blood pressure and he is getting IV diuresis.  -Patient with Foley catheter due to extensive penile swelling, placed on 04/17/2020 in ED at [facility].  We will try to remove catheter  and gave him a voiding trial. -Continue Aldactone.  BPH. -Continue tamsulosin.  Type 2 diabetes mellitus.  Well controlled with A1c of 5.9. -Continue carb modified diet and SSI.  AKI.  Most likely prerenal.  Mild worsening of creatinine today. -Discontinue IV Lasix today. -Monitor renal function. -Avoid nephrotoxins.   Objective: Vitals:   04/26/20 0057 04/26/20 0406 04/26/20 0727 04/26/20 1144  BP:  95/70 100/74 94/73  Pulse:  86 84 89  Resp:  19 19 19   Temp:  97.6 F (36.4 C) 97.7 F (36.5 C) 98.8 F (37.1 C)  TempSrc:  Oral  Oral  SpO2:  99% 96% 100%  Weight: 101.7 kg 101.7 kg    Height:        Intake/Output Summary (Last 24 hours) at 04/26/2020 1537 Last data filed at 04/26/2020 1345 Gross per 24 hour  Intake 2640 ml  Output 475 ml  Net 2165 ml   Filed Weights   04/25/20 0347 04/26/20 0057 04/26/20 0406  Weight: 86.5 kg 101.7 kg 101.7 kg    Examination:  General exam: Appears calm and comfortable  Respiratory system: Clear to auscultation. Respiratory effort normal. Cardiovascular system: S1 & S2 heard, RRR. No JVD, murmurs, rubs, gallops or clicks. Gastrointestinal system: Soft, nontender, nondistended, bowel sounds positive. Central nervous system: Alert and oriented.  No focal neurological deficits. GU.  scrotal edema seems improving. Extremities: 1+ LE edema, no cyanosis, pulses intact and symmetrical. Psychiatry: Judgement and insight appear normal.     DVT prophylaxis: Heparin Code Status: Full Family Communication: Daughter was updated on phone. Disposition Plan:  Status is: Inpatient  Remains inpatient appropriate because:IV treatments appropriate due to intensity of illness or inability to take PO   Dispo: The patient is from: Home               Anticipated d/c is to: Home              Anticipated d/c date is: 2 days              Patient currently is not medically stable to d/c.  Getting voiding trial, might need replacement of Foley and urology consult.  Consultants:   None  Procedures:  [place]  Antimicrobials:   Data Reviewed: I have personally reviewed following labs and imaging studies  CBC: Recent Labs  Lab 04/23/20 1206 04/25/20 0611 04/26/20 0524  WBC 5.3 4.2 4.1  NEUTROABS 4.1  --   --   HGB 13.5 12.9* 13.3  HCT 41.4 38.0* 40.1  MCV 98.3 96.2 97.8  PLT 177 172 169   Basic Metabolic Panel: Recent Labs  Lab 04/23/20 1206 04/24/20 0706 04/25/20 0611 04/26/20 0524  NA 139 138 137 137  K 4.3 3.7 4.0 3.9  CL 101 101 102 99  CO2 24 25 24 26   GLUCOSE 82 84 98 99  BUN 34* 31* 30* 35*  CREATININE 1.45* 1.33* 1.48* 1.59*  CALCIUM 9.0 8.7* 8.5* 8.7*   GFR: Estimated Creatinine Clearance: 42.1 mL/min (A) (by C-G formula based on SCr of 1.59 mg/dL (H)). Liver Function Tests: Recent Labs  Lab 04/23/20 1206  AST 45*  ALT 78*  ALKPHOS 44  BILITOT 1.5*  PROT 6.8  ALBUMIN 3.7   No results for input(s): LIPASE, AMYLASE in the last 168 hours. No results for input(s): AMMONIA in the last 168 hours. Coagulation Profile: No results for input(s): INR, PROTIME in the last 168 hours. Cardiac Enzymes: No results for input(s): CKTOTAL, CKMB, CKMBINDEX, TROPONINI in the last 168 hours. BNP (last 3 results) No results for input(s): PROBNP in the last 8760 hours. HbA1C: Recent Labs    04/24/20 0654  HGBA1C 5.9*   CBG: Recent Labs  Lab 04/25/20 2003 04/25/20 2332 04/26/20 0435 04/26/20 0730 04/26/20 1145  GLUCAP 131* 120* 101* 76 82   Lipid Profile: No results for input(s): CHOL, HDL, LDLCALC, TRIG, CHOLHDL, LDLDIRECT in the last 72 hours. Thyroid Function Tests: No results for input(s): TSH, T4TOTAL, FREET4, T3FREE, THYROIDAB in the last 72 hours. Anemia Panel: No results for input(s):  VITAMINB12, FOLATE, FERRITIN, TIBC, IRON, RETICCTPCT in the last 72 hours. Sepsis Labs: No results for input(s): PROCALCITON, LATICACIDVEN in the last 168 hours.  Recent Results (from the past 240 hour(s))  Respiratory Panel by RT PCR (Flu A&B, Covid) - Nasopharyngeal Swab     Status: None   Collection Time: 04/23/20  1:14 PM   Specimen: Nasopharyngeal Swab  Result Value Ref Range Status   SARS Coronavirus 2 by RT PCR NEGATIVE NEGATIVE Final    Comment: (NOTE) SARS-CoV-2 target nucleic acids are NOT DETECTED. The SARS-CoV-2 RNA is generally detectable in upper respiratoy specimens during the acute phase of infection. The lowest concentration of SARS-CoV-2 viral copies this assay can detect is 131 copies/mL. A negative result does not preclude SARS-Cov-2 infection and  should not be used as the sole basis for treatment or other patient management decisions. A negative result may occur with  improper specimen collection/handling, submission of specimen other than nasopharyngeal swab, presence of viral mutation(s) within the areas targeted by this assay, and inadequate number of viral copies (<131 copies/mL). A negative result must be combined with clinical observations, patient history, and epidemiological information. The expected result is Negative. Fact Sheet for Patients:  PinkCheek.be Fact Sheet for Healthcare Providers:  GravelBags.it This test is not yet ap proved or cleared by the [place] FDA and  has been authorized for detection and/or diagnosis of SARS-CoV-2 by FDA under an Emergency Use Authorization (EUA). This EUA will remain  in effect (meaning this test can be used) for the duration of the COVID-19 declaration under Section 564(b)(1) of the Act, 21 U.S.C. section 360bbb-3(b)(1), unless the authorization is terminated or revoked sooner.    Influenza A by PCR NEGATIVE NEGATIVE Final   Influenza B by PCR  NEGATIVE NEGATIVE Final    Comment: (NOTE) The Xpert Xpress SARS-CoV-2/FLU/RSV assay is intended as an aid in  the diagnosis of influenza from Nasopharyngeal swab specimens and  should not be used as a sole basis for treatment. Nasal washings and  aspirates are unacceptable for Xpert Xpress SARS-CoV-2/FLU/RSV  testing. Fact Sheet for Patients: PinkCheek.be Fact Sheet for Healthcare Providers: GravelBags.it This test is not yet approved or cleared by the [place] FDA and  has been authorized for detection and/or diagnosis of SARS-CoV-2 by  FDA under an Emergency Use Authorization (EUA). This EUA will remain  in effect (meaning this test can be used) for the duration of the  Covid-19 declaration under Section 564(b)(1) of the Act, 21  U.S.C. section 360bbb-3(b)(1), unless the authorization is  terminated or revoked. Performed at [facility], [address] [place], [place] [address]      Radiology Studies: No results found.  Scheduled Meds: . aspirin EC  81 mg Oral Daily  . carvedilol  3.125 mg Oral BID WC  . Chlorhexidine Gluconate Cloth  6 each Topical Daily  . enoxaparin (LOVENOX) injection  40 mg Subcutaneous Q24H  . insulin aspart  0-15 Units Subcutaneous Q4H  . QUEtiapine  25 mg Oral QHS  . simvastatin  40 mg Oral QHS  . sodium chloride flush  3 mL Intravenous Q12H  . spironolactone  25 mg Oral Daily  . tamsulosin  0.4 mg Oral QHS   Continuous Infusions: . sodium chloride       LOS: 3 days   Time spent:40  Minutes.  Lorella Nimrod, MD [facility]  If 7PM-7AM, please contact night-coverage [email or web address]  04/26/2020, 3:37 PM   This record has been created using Systems analyst. Errors have been sought and corrected,but may not always be located. Such creation errors do not reflect on the standard of care.

## 2020-04-26 NOTE — Progress Notes (Addendum)
MD gave verbal order to flush Foley catheter with 100 ml, then wait 30 minutes to pull foley.    Flushed foley catheter with of sterile water.  Used sterile syringes and sterile gloves.   Disconnected port from catheter, flushed, then reconnected.  Minimized time disconnected.   Will be pull foley in 30 minutes after flush- per MD recommendation

## 2020-04-27 ENCOUNTER — Inpatient Hospital Stay

## 2020-04-27 LAB — BLOOD GAS, ARTERIAL
Acid-Base Excess: 2 mmol/L (ref 0.0–2.0)
Allens test (pass/fail): POSITIVE — AB
Bicarbonate: 27.8 mmol/L (ref 20.0–28.0)
FIO2: 36
O2 Saturation: 99.2 %
Patient temperature: 37
pCO2 arterial: 47 mmHg (ref 32.0–48.0)
pH, Arterial: 7.38 (ref 7.350–7.450)
pO2, Arterial: 142 mmHg — ABNORMAL HIGH (ref 83.0–108.0)

## 2020-04-27 LAB — URINALYSIS, COMPLETE (UACMP) WITH MICROSCOPIC
Bilirubin Urine: NEGATIVE
Glucose, UA: NEGATIVE mg/dL
Ketones, ur: NEGATIVE mg/dL
Nitrite: NEGATIVE
Protein, ur: 100 mg/dL — AB
RBC / HPF: >50 RBC/hpf — ABNORMAL HIGH (ref 0–5)
Specific Gravity, Urine: 1.019 (ref 1.005–1.030)
Squamous Epithelial / HPF: NONE SEEN (ref 0–5)
WBC, UA: >50 WBC/hpf — ABNORMAL HIGH (ref 0–5)
pH: 5 (ref 5.0–8.0)

## 2020-04-27 LAB — TSH: TSH: 7.297 u[IU]/mL — ABNORMAL HIGH (ref 0.350–4.500)

## 2020-04-27 LAB — COMPREHENSIVE METABOLIC PANEL
ALT: 45 U/L — ABNORMAL HIGH (ref 0–44)
AST: 25 U/L (ref 15–41)
Albumin: 3.6 g/dL (ref 3.5–5.0)
Alkaline Phosphatase: 51 U/L (ref 38–126)
Anion gap: 10 (ref 5–15)
BUN: 41 mg/dL — ABNORMAL HIGH (ref 8–23)
CO2: 26 mmol/L (ref 22–32)
Calcium: 8.7 mg/dL — ABNORMAL LOW (ref 8.9–10.3)
Chloride: 100 mmol/L (ref 98–111)
Creatinine, Ser: 1.55 mg/dL — ABNORMAL HIGH (ref 0.61–1.24)
GFR calc Af Amer: 49 mL/min — ABNORMAL LOW (ref >60)
GFR calc non Af Amer: 42 mL/min — ABNORMAL LOW (ref >60)
Glucose, Bld: 95 mg/dL (ref 70–99)
Potassium: 4.1 mmol/L (ref 3.5–5.1)
Sodium: 136 mmol/L (ref 135–145)
Total Bilirubin: 1.1 mg/dL (ref 0.3–1.2)
Total Protein: 6.4 g/dL — ABNORMAL LOW (ref 6.5–8.1)

## 2020-04-27 LAB — GLUCOSE, CAPILLARY
Glucose-Capillary: 106 mg/dL — ABNORMAL HIGH (ref 70–99)
Glucose-Capillary: 113 mg/dL — ABNORMAL HIGH (ref 70–99)
Glucose-Capillary: 87 mg/dL (ref 70–99)
Glucose-Capillary: 92 mg/dL (ref 70–99)
Glucose-Capillary: 97 mg/dL (ref 70–99)
Glucose-Capillary: 98 mg/dL (ref 70–99)
Glucose-Capillary: 99 mg/dL (ref 70–99)

## 2020-04-27 MED ORDER — ENSURE MAX PROTEIN PO LIQD
11.0000 [oz_av] | Freq: Two times a day (BID) | ORAL | Status: DC
  Administered 2020-04-29: 11 [oz_av] via ORAL
  Filled 2020-04-27: qty 330

## 2020-04-27 MED ORDER — HYDROXYZINE HCL 25 MG PO TABS
25.0000 mg | ORAL_TABLET | Freq: Once | ORAL | Status: AC
  Administered 2020-04-27: 25 mg via ORAL
  Filled 2020-04-27: qty 1

## 2020-04-27 MED ORDER — IPRATROPIUM-ALBUTEROL 0.5-2.5 (3) MG/3ML IN SOLN
3.0000 mL | RESPIRATORY_TRACT | Status: DC | PRN
  Administered 2020-04-27 – 2020-04-29 (×3): 3 mL via RESPIRATORY_TRACT
  Filled 2020-04-27 (×3): qty 3

## 2020-04-27 MED ORDER — SODIUM CHLORIDE 0.9 % IV SOLN
1.0000 g | Freq: Every day | INTRAVENOUS | Status: DC
  Administered 2020-04-27 – 2020-04-29 (×3): 1 g via INTRAVENOUS
  Filled 2020-04-27: qty 1
  Filled 2020-04-27 (×3): qty 10

## 2020-04-27 MED ORDER — LORAZEPAM 2 MG/ML IJ SOLN: 2.0000 mg | INTRAMUSCULAR | Status: DC

## 2020-04-27 MED ORDER — OCUVITE-LUTEIN PO CAPS
1.0000 | ORAL_CAPSULE | Freq: Every day | ORAL | Status: DC
  Administered 2020-04-29 – 2020-05-01 (×3): 1 via ORAL
  Filled 2020-04-27 (×4): qty 1

## 2020-04-27 NOTE — Progress Notes (Signed)
Tylenol 650 mg given po as pt c/o unable to get comfortable and also stated he hasn't been sleeping since he's been here.

## 2020-04-27 NOTE — Progress Notes (Signed)
[facility]) hospital liaison RN note for hospitalized hospice patient  Mr. 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 Gilford Rile with [facility]. Patient brought to [facility] on 5.3.21 for complaints of testicular swelling and worsening BLE swelling. Hospice was notified and home care RN reports patient has been taking his Lasix as prescribed. Mr. Brander is admitted with CHF. This is a related hospital admission.  Met with patient in room. He is pleasant but confused today and seeing things. No family at bedside, though patient now has a Actuary. He could not sleep last night and was given Vistaril to assist. Pt also kept removing O2 and had hypoxic episode with sats in the 60's which quickly recovered with sats in the 90's when O2 replaced.  Spoke with Dr. Reesa Chew and [facility] Dayton Scrape about hospice team concerns of safety of pt discharging to home. Judson Roch is going to reach out to daughter to elicit her thoughts. Dr. Reesa Chew too is concerned about the safety of patient returning home. Pt is on a bair hugger as rectal temp was found to be 95.5. Foley catheter removed yesterday. Pt has had some urine output, will be completing bladder scans to check for retention.  V/S:  T 95.5, BP: 104/78, HR 77, RR 19, O2 sats 100 % on 4 lpm [place].  Pertinent abnormal lab work:   Ref. Range 04/27/2020 07:01  COMPREHENSIVE METABOLIC PANEL Unknown Rpt (A)  BUN Latest Ref Range: 8 - 23 mg/dL 41 (H)  Creatinine Latest Ref Range: 0.61 - 1.24 mg/dL 1.55 (H)  Calcium Latest Ref Range: 8.9 - 10.3 mg/dL 8.7 (L)  ALT Latest Ref Range: 0 - 44 U/L 45 (H)  Total Protein Latest Ref Range: 6.5 - 8.1 g/dL 6.4 (L)  GFR, Est Non African American Latest Ref Range: >60 mL/min 42 (L)  GFR, Est African American Latest Ref Range: >60 mL/min 49 (L)  TSH Latest Ref Range: 0.350 - 4.500 uIU/mL 7.297 (H)    Ref. Range 04/27/2020 09:21   Appearance Latest Ref Range: CLEAR  CLOUDY (A)  Bilirubin Urine Latest Ref Range: NEGATIVE  NEGATIVE  Color, Urine Latest Ref Range: YELLOW  AMBER (A)  Glucose, UA Latest Ref Range: NEGATIVE mg/dL NEGATIVE  Hgb urine dipstick Latest Ref Range: NEGATIVE  LARGE (A)  Ketones, ur Latest Ref Range: NEGATIVE mg/dL NEGATIVE  Leukocytes,Ua Latest Ref Range: NEGATIVE  MODERATE (A)  Nitrite Latest Ref Range: NEGATIVE  NEGATIVE  pH Latest Ref Range: 5.0 - 8.0  5.0  Protein Latest Ref Range: NEGATIVE mg/dL 100 (A)  Specific Gravity, Urine Latest Ref Range: 1.005 - 1.030  1.019  Bacteria, UA Latest Ref Range: NONE SEEN  MANY (A)  Budding Yeast Unknown PRESENT  Mucus Unknown PRESENT  RBC / HPF Latest Ref Range: 0 - 5 RBC/hpf >50 (H)  Squamous Epithelial / LPF Latest Ref Range: 0 - 5  NONE SEEN  WBC Clumps Unknown PRESENT  WBC, UA Latest Ref Range: 0 - 5 WBC/hpf >50 (H)  URINE CULTURE Unknown Rpt   Diagnostics: Chest Xray 5.7.21 IMPRESSION: 1. Severe cardiomegaly again noted. No prominent pulmonary venous congestion.  2. Persistent left base atelectasis/infiltrate and small left pleural effusion. Slight improvement in aeration from prior exam   IV's/PRN's: Rocephin 1 G IV Daily, Vistaril 25 mg po prn sleep X 1 dose. Have started scheduled torsemide 40 mg po daily.  I/O: 2640/1915  Assessment/Plan: Active Problems:  CHF (congestive heart failure) (HCC)   AKI (acute kidney injury) ([place])   Testicle swelling  Acute on chronic systolic heart failure.  Echo with EF of 25 to 30%. Symptoms seem improving, scrotum still swollen, per patient also improving. Weight decreased to 190. pound from 222 on admission. Patient is under hospice care but wants to stay full code and would like to get some rehab. -PT/OT are recommending home health services. hospice will not provide PT services, we will see if he qualifies for SNF as he is very much interested in rehab.  Patient lives alone and cannot  take care of himself. IV Lasix was discontinued yesterday after an increase in his creatinine. - start p.o. torsemide today. -Continue to hold Coreg and Entresto due to softer blood pressure   Possible UTI.  His Foley was removed yesterday with some concern of hematuria.  He was able to void.  No urinary symptoms.  UA with large amount of leukocytes, RBCs and bacteria. -Check urine culture. -Bladder scan to make sure that there is no retention. -Start him on Rocephin.  Elevated TSH.  TSH was ordered by night coverage when patient become little altered and hypoxic, which was most likely secondary to getting Vistaril and his oxygen cannula was on floor. TSH elevated at 7.297. We will check free T4 and T3.  BPH. -Continue tamsulosin.  Type 2 diabetes mellitus.  Well controlled with A1c of 5.9. -Continue carb modified diet and SSI.  AKI.  Most likely prerenal.  Creatinine stable with mild improvement today. -Monitor renal function. -Avoid nephrotoxins.  Discharge Planning: concerns pt will not be able to manage his care at home. Discussed with TOC [place] Scrape who is reaching out to speak with daughter who is assisting patient with care. Discussed with Dr. Reesa Chew.  Family contact: No family at bedside. [place] Scrape with TOC to reach out to daughter about discharge planning.  IDT: updated Goals of Care: Patient remains a full code at this time.  Please call with any hospice related questions or concerns.  Thank you. Margaretmary Eddy, BSN, [facility] [phone]

## 2020-04-27 NOTE — Progress Notes (Addendum)
Cross Cover brief note Patient earlier in shift would get hypoxic with oxygen off but quickly recovered and reoriented.Repeated complained of inability to sleep to nurse tonight and last night. Was give 25 mg vistaril which made patient more confused.  Additionally patient with poor urine output since removal of foley  And patient with rectal temp 95.5.  Patient mental status  improving  during bedside assessment.  Difficult IV stick and lab tech unable to obtain labs. - CMP check lytes and hepatic function further assess altered mental status, Assess thyroid function with TSH secondary to hypothermia and check ABG secondary to hypoxia.   RT requested to obtain blood for labs along with blood gas.  CXR is pending. Durin application of bear hugger noted was partial rectal prolabse vs internal hemorrhoid and reduced easily. BP and heart rate stable

## 2020-04-27 NOTE — Progress Notes (Signed)
Physical Therapy Treatment Patient Details Name: Trevor Rodriguez. MRN: [id number] DOB: 11/02/1941 Today's Date: 04/27/2020    History of Present Illness presented to ER secondary to scrotal swelling, SOB; admitted for management of acute/chronic CHF.    PT Comments    Patient sleeping upon arrival to room, but easily awakens to voice.  Cleared for participation with session per primary RN. Does endorse having a "rough night" and feels generally fatigued today.  Agreeable for OOB to chair with RW, cga/min assist, but declines additional gait distance due to fatigue.  Does complete some LE therex for strength/endurance/edema management, but fatigues quickly with repetition. Care team with significant concerns about patient's ability to manage care in home environment.  Given decline in functional status, will update recs for STR, but will continue to monitor and update further as patient medically optimized. If continued concerns exist about living arrangement, may consider transition to long-term care for more permanent solution to care needs.    Follow Up Recommendations  SNF     Equipment Recommendations  Rolling walker with 5" wheels    Recommendations for Other Services       Precautions / Restrictions Precautions Precautions: Fall Restrictions Weight Bearing Restrictions: No    Mobility  Bed Mobility Overal bed mobility: Modified Independent             General bed mobility comments: utilizes bilat UEs to self-assist L LE over edge of bed, but able to complete without physical assist from therapist  Transfers Overall transfer level: Needs assistance Equipment used: Rolling walker (2 wheeled) Transfers: Sit to/from Stand Sit to Stand: Min guard         General transfer comment: increased use of momentum and UE support for lift off, broad BOS due to persistent LE/scrotal swelling  Ambulation/Gait Ambulation/Gait assistance: Min guard Gait Distance (Feet): 5  Feet Assistive device: Rolling walker (2 wheeled)       General Gait Details: broad BOS, RW arms-length anterior to patient; choppy gait performance with deceased balance in A/P plane (When stepping back towards chair)   Stairs             Wheelchair Mobility    Modified Rankin (Stroke Patients Only)       Balance Overall balance assessment: Needs assistance Sitting-balance support: No upper extremity supported Sitting balance-Leahy Scale: Good     Standing balance support: Bilateral upper extremity supported Standing balance-Leahy Scale: Fair                              Cognition Arousal/Alertness: Awake/alert   Overall Cognitive Status: Within Functional Limits for tasks assessed                                 General Comments: mild deficits in STM noted      Exercises Other Exercises Other Exercises: Seated LE therex, 1x10, act ROM for muscular strength/endurance: ankle pumps, LAQs, marching. Min cuing/assist for correct technique. Functional strength diminishes quickly with fatigue/repetition.    General Comments        Pertinent Vitals/Pain Pain Assessment: No/denies pain    Home Living                      Prior Function            PT Goals (current goals can now be found in the  care plan section) Acute Rehab PT Goals Patient Stated Goal: to get stronger PT Goal Formulation: With patient Time For Goal Achievement: 05/09/20 Potential to Achieve Goals: Good Progress towards PT goals: Progressing toward goals    Frequency    Min 2X/week      PT Plan Current plan remains appropriate    Co-evaluation              AM-PAC PT "6 Clicks" Mobility   Outcome Measure  Help needed turning from your back to your side while in a flat bed without using bedrails?: None Help needed moving from lying on your back to sitting on the side of a flat bed without using bedrails?: None Help needed moving to and  from a bed to a chair (including a wheelchair)?: A Little Help needed standing up from a chair using your arms (e.g., wheelchair or bedside chair)?: A Little Help needed to walk in hospital room?: A Little Help needed climbing 3-5 steps with a railing? : A Little 6 Click Score: 20    End of Session Equipment Utilized During Treatment: Gait belt;Oxygen Activity Tolerance: Patient tolerated treatment well;Patient limited by fatigue Patient left: in chair;with call bell/phone within reach;with chair alarm set Nurse Communication: Mobility status PT Visit Diagnosis: Muscle weakness (generalized) (M62.81);Difficulty in walking, not elsewhere classified (R26.2)     Time: 7262-0355 PT Time Calculation (min) (ACUTE ONLY): 17 min  Charges:  $Therapeutic Activity: 8-22 mins                     Trevor Rodriguez H. Trevor Rodriguez, PT, DPT, NCS 04/27/20, 5:14 PM [phone]

## 2020-04-27 NOTE — Progress Notes (Signed)
9784 Patient Lethargic and confused provider Manuela Schwartz made aware and given orders to continue to monitor patient   0545 Patients rectal temp 95.5 and patient more confused Manuela Schwartz made aware and orders to place bair hugger   0600 Provider Manuela Schwartz at bedside New orders placed

## 2020-04-27 NOTE — Progress Notes (Signed)
As I was giving report to nurse that was sent over to our unit, [facility], pt heard yelling out.  As we entered the room, pt very confused, had pushed his blankets into the floor and pulled off his O2 again.  O2 sats were in the 60's and O2 replaced when pt finally let us replace it as he was getting combative and was refusng to put it back on.  O2 sats went back into the 90's.  CNA and new nurse, Dow Adolph assisted pt to bed.  I called Np on call and notified her of situation.  Bladder scan by [facility] revealed 300cc in bladder.  All pt had out since removal of catheter yesterday was approx 150-200 cc of bloody urine output.  Penis and scrotal area still very swollen.  Bp remains soft in 90's.  99/78.  Pt eventually calmed and was relaxed in bed.

## 2020-04-27 NOTE — Progress Notes (Signed)
Initial Nutrition Assessment  DOCUMENTATION CODES:   Obesity unspecified  INTERVENTION:   Ensure Max protein supplement BID, each supplement provides 150kcal and 30g of protein.  Ocuvite daily for wound healing (provides zinc, vitamin A, vitamin C, Vitamin E, copper, and selenium)  NUTRITION DIAGNOSIS:   Increased nutrient needs related to wound healing as evidenced by increased estimated needs.  GOAL:   Patient will meet greater than or equal to 90% of their needs  MONITOR:   PO intake, Supplement acceptance, Labs, Weight trends, Skin, I & O's  REASON FOR ASSESSMENT:   Consult Assessment of nutrition requirement/status, Diet education  ASSESSMENT:   79 year old male with history of hypertension, hyperlipidemia, chronic systolic heart failure with EF of 25 to 30% (followed by hospice) who is admitted with scrotal swelling   Pt not appropriate for education at this time. Pt with intermittent confusion today. Visited pt's room today. Pt sleeping at time of RD visit. Sitter at bedside reports that pt ate pretty well at breakfast this morning but ate only bites of his lunch. Pt is documented to be eating 75-95% of most meals. RD will add supplements and vitamins to support wound healing. Per chart, pt is up ~11lbs from his UBW. Pt does appear to be weight stable at baseline.   Medications reviewed and include: aspirin, lovenox, insulin, aldactone, torsemide, ceftriaxone   Labs reviewed: BUN 41(H), creat 1.55(H) cbgs- 98, 113, 99, 92, 97 x 24 hrs  NUTRITION - FOCUSED PHYSICAL EXAM:    Most Recent Value  Orbital Region  No depletion  Upper Arm Region  Mild depletion  Thoracic and Lumbar Region  No depletion  Buccal Region  Mild depletion  Temple Region  Moderate depletion  Clavicle Bone Region  No depletion  Clavicle and Acromion Bone Region  No depletion  Scapular Bone Region  No depletion  Dorsal Hand  No depletion  Patellar Region  Mild depletion  Anterior Thigh  Region  No depletion  Posterior Calf Region  No depletion  Edema (RD Assessment)  None  Hair  Reviewed  Eyes  Reviewed  Mouth  Reviewed  Skin  Reviewed  Nails  Reviewed     Diet Order:   Diet Order            Diet heart healthy/carb modified Room service appropriate? Yes; Fluid consistency: Thin  Diet effective now             EDUCATION NEEDS:   Not appropriate for education at this time  Skin:  Skin Assessment: Reviewed RN Assessment(Left outer calf 3X3X.1cm, Left inner calf 7X7X.1cm, Right inner calf 2X2X.1cm)  Last BM:  5/6- TYPE 2  Height:   Ht Readings from Last 1 Encounters:  04/23/20 5\' 6"  (1.676 m)    Weight:   Wt Readings from Last 1 Encounters:  04/26/20 101.7 kg    Ideal Body Weight:  64.5 kg  BMI:  Body mass index is 36.17 kg/m.  Estimated Nutritional Needs:   Kcal:  2000-2300kcal/day  Protein:  100-115g/day  Fluid:  >1.6L/day  06/26/20 MS, RD, LDN Please refer to [facility] for RD and/or RD on-call/weekend/after hours pager

## 2020-04-27 NOTE — Progress Notes (Signed)
Pt recently asked me " where am I and why am I here?"  Pt reoriented.

## 2020-04-27 NOTE — Progress Notes (Signed)
Pt c/o hes still unable to sleep and he would like something to help him.  Pt keeps taking his O2 off.  o2 replaced on pt several tx's tonight.  Pt instructed to keep O2 on.  Np notified of pt's complaionts of no rest.  Atarax 25mg  ordered and given.

## 2020-04-27 NOTE — Progress Notes (Addendum)
PROGRESS NOTE    Trevor Rodriguez.  ZOX:[id number] DOB: [birth year] DOA: 04/23/2020 PCP: Trevor Chou, NP   Brief Narrative:  Trevor Rodriguezis a 79 y.o.malewith medical history significant ofPatient is a 79 year old male with history of hypertension, hyperlipidemia,chronic systolic heart failure with EF of 25 to 30% from 06/2019 presents complaining of scrotal swellinglast few days.She was seen in the ED on 4/27 found with BNP of 2470. Scrotal ultrasound showed diffuse scrotal wall thickening with bilateral hydrocele likely due to CHF no evidence of torsion no evidence of infection. He was given IV Lasix and discharged home. He was recommended to take Lasix 40 mg p.o. twice daily for the next 3 days. Please note patient is on hospice care. He tells that he is taking his Lasix. He does report having shortness of breath but unable to tell me if acutely is worse. No chest pain. Does not think his orthopneic.  Subjective: Patient has no new complaints today.  He was accompanied by a sitter in the room.  Overnight developed some agitation and hypoxia.  He was given Vistaril and when nursing staff saw him his oxygen was hanging on the floor.  Hypoxia resolved quickly with resumption of O2.  Assessment & Plan:   Active Problems:   CHF (congestive heart failure) (HCC)   AKI (acute kidney injury) (HCC)   Testicle swelling  Acute on chronic systolic heart failure.  Echo with EF of 25 to 30%. Symptoms seem improving, scrotum still swollen, per patient also improving. Weight decreased to 190. pound from 222 on admission. Patient is under hospice care but wants to stay full code and would like to get some rehab. -PT/OT are recommending home health services. hospice will not provide PT services, we will see if he qualifies for SNF as he is very much interested in rehab.  Patient lives alone and cannot take care of himself. IV Lasix was discontinued yesterday after an increase in his  creatinine. - start p.o. torsemide today. -Continue to hold Coreg and Entresto due to softer blood pressure   Possible UTI.  His Foley was removed yesterday with some concern of hematuria.  He was able to void.  No urinary symptoms.  UA with large amount of leukocytes, RBCs and bacteria. -Check urine culture. -Bladder scan to make sure that there is no retention. -Start him on Rocephin.  Elevated TSH.  TSH was ordered by night coverage when patient become little altered and hypoxic, which was most likely secondary to getting Vistaril and his oxygen cannula was on floor. TSH elevated at 7.297. We will check free T4 and T3.  BPH. -Continue tamsulosin.  Type 2 diabetes mellitus.  Well controlled with A1c of 5.9. -Continue carb modified diet and SSI.  AKI.  Most likely prerenal.  Creatinine stable with mild improvement today. -Monitor renal function. -Avoid nephrotoxins.   Objective: Vitals:   04/27/20 0922 04/27/20 1048 04/27/20 1152 04/27/20 1200  BP:   91/73   Pulse:   (!) 121 91  Resp:   18   Temp: (!) 96.7 F (35.9 C) (!) 97.3 F (36.3 C) (!) 97.4 F (36.3 C)   TempSrc: Rectal Rectal Rectal   SpO2:   90%   Weight:      Height:        Intake/Output Summary (Last 24 hours) at 04/27/2020 1412 Last data filed at 04/27/2020 1200 Gross per 24 hour  Intake 360 ml  Output 975 ml  Net -615 ml   [employer]  04/25/20 0347 04/26/20 0057 04/26/20 0406  Weight: 86.5 kg 101.7 kg 101.7 kg    Examination:  General exam: Appears calm and comfortable  Respiratory system: Clear to auscultation. Respiratory effort normal. Cardiovascular system: S1 & S2 heard, RRR. No JVD, murmurs, rubs, gallops or clicks. Gastrointestinal system: Soft, nontender, nondistended, bowel sounds positive. Central nervous system: Alert and oriented. No focal neurological deficits. GU.  scrotal edema seems improving. Extremities: 1+ LE edema, no cyanosis, pulses intact and symmetrical. Psychiatry:  Judgement and insight appear normal.     DVT prophylaxis: Heparin Code Status: Full Family Communication: Daughter was updated on phone. Disposition Plan:  Status is: Inpatient  Remains inpatient appropriate because:IV treatments appropriate due to intensity of illness or inability to take PO   Dispo: The patient is from: Home              Anticipated d/c is to: SNF if qualifies.              Anticipated d/c date is: 2 days              Patient currently is not medically stable to d/c.  Lives alone and is not safe to discharge him home at this time.  Had a long discussion with hospice and they are advising a placement, they will continue following him up.  Consultants:   None  Procedures:  [place]  Antimicrobials:   Data Reviewed: I have personally reviewed following labs and imaging studies  CBC: Recent Labs  Lab 04/23/20 1206 04/25/20 0611 04/26/20 0524  WBC 5.3 4.2 4.1  NEUTROABS 4.1  --   --   HGB 13.5 12.9* 13.3  HCT 41.4 38.0* 40.1  MCV 98.3 96.2 97.8  PLT 177 172 169   Basic Metabolic Panel: Recent Labs  Lab 04/23/20 1206 04/24/20 0706 04/25/20 0611 04/26/20 0524 04/27/20 0701  NA 139 138 137 137 136  K 4.3 3.7 4.0 3.9 4.1  CL 101 101 102 99 100  CO2 24 25 24 26 26   GLUCOSE 82 84 98 99 95  BUN 34* 31* 30* 35* 41*  CREATININE 1.45* 1.33* 1.48* 1.59* 1.55*  CALCIUM 9.0 8.7* 8.5* 8.7* 8.7*   GFR: Estimated Creatinine Clearance: 43.2 mL/min (A) (by C-G formula based on SCr of 1.55 mg/dL (H)). Liver Function Tests: Recent Labs  Lab 04/23/20 1206 04/27/20 0701  AST 45* 25  ALT 78* 45*  ALKPHOS 44 51  BILITOT 1.5* 1.1  PROT 6.8 6.4*  ALBUMIN 3.7 3.6   No results for input(s): LIPASE, AMYLASE in the last 168 hours. No results for input(s): AMMONIA in the last 168 hours. Coagulation Profile: No results for input(s): INR, PROTIME in the last 168 hours. Cardiac Enzymes: No results for input(s): CKTOTAL, CKMB, CKMBINDEX, TROPONINI in the last 168  hours. BNP (last 3 results) No results for input(s): PROBNP in the last 8760 hours. HbA1C: No results for input(s): HGBA1C in the last 72 hours. CBG: Recent Labs  Lab 04/27/20 0004 04/27/20 0425 04/27/20 0605 04/27/20 0731 04/27/20 1152  GLUCAP 98 113* 99 92 97   Lipid Profile: No results for input(s): CHOL, HDL, LDLCALC, TRIG, CHOLHDL, LDLDIRECT in the last 72 hours. Thyroid Function Tests: Recent Labs    04/27/20 0701  TSH 7.297*   Anemia Panel: No results for input(s): VITAMINB12, FOLATE, FERRITIN, TIBC, IRON, RETICCTPCT in the last 72 hours. Sepsis Labs: No results for input(s): PROCALCITON, LATICACIDVEN in the last 168 hours.  Recent Results (from the past 240 hour(s))  Respiratory Panel by RT PCR (Flu A&B, Covid) - Nasopharyngeal Swab     Status: None   Collection Time: 04/23/20  1:14 PM   Specimen: Nasopharyngeal Swab  Result Value Ref Range Status   SARS Coronavirus 2 by RT PCR NEGATIVE NEGATIVE Final    Comment: (NOTE) SARS-CoV-2 target nucleic acids are NOT DETECTED. The SARS-CoV-2 RNA is generally detectable in upper respiratoy specimens during the acute phase of infection. The lowest concentration of SARS-CoV-2 viral copies this assay can detect is 131 copies/mL. A negative result does not preclude SARS-Cov-2 infection and should not be used as the sole basis for treatment or other patient management decisions. A negative result may occur with  improper specimen collection/handling, submission of specimen other than nasopharyngeal swab, presence of viral mutation(s) within the areas targeted by this assay, and inadequate number of viral copies (<131 copies/mL). A negative result must be combined with clinical observations, patient history, and epidemiological information. The expected result is Negative. Fact Sheet for Patients:  https://www.moore.com/ Fact Sheet for Healthcare Providers:   https://www.young.biz/ This test is not yet ap proved or cleared by the [place] FDA and  has been authorized for detection and/or diagnosis of SARS-CoV-2 by FDA under an Emergency Use Authorization (EUA). This EUA will remain  in effect (meaning this test can be used) for the duration of the COVID-19 declaration under Section 564(b)(1) of the Act, 21 U.S.C. section 360bbb-3(b)(1), unless the authorization is terminated or revoked sooner.    Influenza A by PCR NEGATIVE NEGATIVE Final   Influenza B by PCR NEGATIVE NEGATIVE Final    Comment: (NOTE) The Xpert Xpress SARS-CoV-2/FLU/RSV assay is intended as an aid in  the diagnosis of influenza from Nasopharyngeal swab specimens and  should not be used as a sole basis for treatment. Nasal washings and  aspirates are unacceptable for Xpert Xpress SARS-CoV-2/FLU/RSV  testing. Fact Sheet for Patients: https://www.moore.com/ Fact Sheet for Healthcare Providers: https://www.young.biz/ This test is not yet approved or cleared by the [place] FDA and  has been authorized for detection and/or diagnosis of SARS-CoV-2 by  FDA under an Emergency Use Authorization (EUA). This EUA will remain  in effect (meaning this test can be used) for the duration of the  Covid-19 declaration under Section 564(b)(1) of the Act, 21  U.S.C. section 360bbb-3(b)(1), unless the authorization is  terminated or revoked. Performed at [facility], [address], [place], [place] [address]      Radiology Studies: DG Chest 1 View  Result Date: 04/27/2020 CLINICAL DATA:  Hypoxia. EXAM: CHEST  1 VIEW COMPARISON:  04/17/2020. FINDINGS: Severe cardiomegaly again noted. No prominent pulmonary venous congestion. Persistent left base atelectasis/infiltrate and small left pleural effusion. Slight improvement in aeration from prior exam. No pneumothorax. Degenerative change thoracic spine. IMPRESSION:  1. Severe cardiomegaly again noted. No prominent pulmonary venous congestion. 2. Persistent left base atelectasis/infiltrate and small left pleural effusion. Slight improvement in aeration from prior exam Electronically Signed   By: Maisie Fus  Register   On: 04/27/2020 07:42    Scheduled Meds: . aspirin EC  81 mg Oral Daily  . carvedilol  3.125 mg Oral BID WC  . Chlorhexidine Gluconate Cloth  6 each Topical Daily  . enoxaparin (LOVENOX) injection  40 mg Subcutaneous Q24H  . insulin aspart  0-15 Units Subcutaneous Q4H  . QUEtiapine  25 mg Oral QHS  . simvastatin  40 mg Oral QHS  . sodium chloride flush  3 mL Intravenous Q12H  . spironolactone  25 mg Oral Daily  . tamsulosin  0.4 mg Oral QHS  . torsemide  40 mg Oral Daily   Continuous Infusions: . sodium chloride       LOS: 4 days   Time spent:40  Minutes.  Arnetha Courser, MD [facility]  If 7PM-7AM, please contact night-coverage [email or web address]  04/27/2020, 2:12 PM   This record has been created using Conservation officer, historic buildings. Errors have been sought and corrected,but may not always be located. Such creation errors do not reflect on the standard of care.

## 2020-04-28 LAB — GLUCOSE, CAPILLARY
Glucose-Capillary: 115 mg/dL — ABNORMAL HIGH (ref 70–99)
Glucose-Capillary: 70 mg/dL (ref 70–99)
Glucose-Capillary: 71 mg/dL (ref 70–99)
Glucose-Capillary: 78 mg/dL (ref 70–99)
Glucose-Capillary: 87 mg/dL (ref 70–99)

## 2020-04-28 LAB — THYROID PANEL
Free Thyroxine Index: 1.4 (ref 1.2–4.9)
T3 Uptake Ratio: 26 % (ref 24–39)
T4, Total: 5.5 ug/dL (ref 4.5–12.0)

## 2020-04-28 LAB — CBC
HCT: 42.9 % (ref 39.0–52.0)
Hemoglobin: 13.7 g/dL (ref 13.0–17.0)
MCH: 32.8 pg (ref 26.0–34.0)
MCHC: 31.9 g/dL (ref 30.0–36.0)
MCV: 102.6 fL — ABNORMAL HIGH (ref 80.0–100.0)
Platelets: 152 10*3/uL (ref 150–400)
RBC: 4.18 MIL/uL — ABNORMAL LOW (ref 4.22–5.81)
RDW: 17 % — ABNORMAL HIGH (ref 11.5–15.5)
WBC: 5.4 10*3/uL (ref 4.0–10.5)
nRBC: 0 % (ref 0.0–0.2)

## 2020-04-28 LAB — BASIC METABOLIC PANEL
Anion gap: 12 (ref 5–15)
BUN: 45 mg/dL — ABNORMAL HIGH (ref 8–23)
CO2: 25 mmol/L (ref 22–32)
Calcium: 8.8 mg/dL — ABNORMAL LOW (ref 8.9–10.3)
Chloride: 97 mmol/L — ABNORMAL LOW (ref 98–111)
Creatinine, Ser: 1.6 mg/dL — ABNORMAL HIGH (ref 0.61–1.24)
GFR calc Af Amer: 47 mL/min — ABNORMAL LOW (ref >60)
GFR calc non Af Amer: 40 mL/min — ABNORMAL LOW (ref >60)
Glucose, Bld: 93 mg/dL (ref 70–99)
Potassium: 4.6 mmol/L (ref 3.5–5.1)
Sodium: 134 mmol/L — ABNORMAL LOW (ref 135–145)

## 2020-04-28 NOTE — Plan of Care (Signed)
  Problem: Education: Goal: Knowledge of General Education information will improve Description: Including pain rating scale, medication(s)/side effects and non-pharmacologic comfort measures Outcome: Progressing   Problem: Health Behavior/Discharge Planning: Goal: Ability to manage health-related needs will improve Outcome: Progressing   Problem: Clinical Measurements: Goal: Ability to maintain clinical measurements within normal limits will improve Outcome: Progressing Goal: Will remain free from infection Outcome: Progressing Goal: Diagnostic test results will improve Outcome: Progressing Goal: Respiratory complications will improve Outcome: Progressing Goal: Cardiovascular complication will be avoided Outcome: Progressing   Problem: Activity: Goal: Risk for activity intolerance will decrease Outcome: Progressing   Problem: Nutrition: Goal: Adequate nutrition will be maintained Outcome: Progressing   Problem: Coping: Goal: Level of anxiety will decrease Outcome: Progressing   Problem: Elimination: Goal: Will not experience complications related to bowel motility Outcome: Progressing Goal: Will not experience complications related to urinary retention Outcome: Progressing   Problem: Safety: Goal: Ability to remain free from injury will improve Outcome: Progressing   Problem: Skin Integrity: Goal: Risk for impaired skin integrity will decrease Outcome: Progressing   Problem: Education: Goal: Ability to demonstrate management of disease process will improve Outcome: Progressing Goal: Ability to verbalize understanding of medication therapies will improve Outcome: Progressing Goal: Individualized Educational Video(s) Outcome: Progressing   Problem: Activity: Goal: Capacity to carry out activities will improve Outcome: Progressing   Problem: Cardiac: Goal: Ability to achieve and maintain adequate cardiopulmonary perfusion will improve Outcome: Progressing    

## 2020-04-28 NOTE — Progress Notes (Signed)
Extremities very cold to touch.  Rectal temp 95.4oF.  Warming pad placed under pt and warm blankets applied.  Radial pulses are weak, and pedal pulses are absent (verified by Zannie Kehr., RN).  Dr. Nelson Chimes paged and came to bedside for evaluation.  New orders received for blood cultures.   04/28/20 0900  Notify: Provider  Provider Name/Title Nelson Chimes MD  Date Provider Notified 04/28/20  Time Provider Notified [phone]  Notification Type Page  Notification Reason Change in status  Response See new orders  Date of Provider Response 04/28/20  Time of Provider Response [phone]

## 2020-04-28 NOTE — Progress Notes (Signed)
PROGRESS NOTE    Trevor Rodriguez.  EHU:[id number] DOB: 04/11/1941 DOA: 04/23/2020 PCP: Marletta Lor, NP   Brief Narrative:  Trevor Rodriguezis a 79 y.o.malewith medical history significant ofPatient is a 79 year old male with history of hypertension, hyperlipidemia,chronic systolic heart failure with EF of 25 to 30% from 06/2019 presents complaining of scrotal swellinglast few days.She was seen in the ED on 4/27 found with BNP of 2470. Scrotal ultrasound showed diffuse scrotal wall thickening with bilateral hydrocele likely due to CHF no evidence of torsion no evidence of infection. He was given IV Lasix and discharged home. He was recommended to take Lasix 40 mg p.o. twice daily for the next 3 days. Please note patient is on hospice care. He tells that he is taking his Lasix. He does report having shortness of breath but unable to tell me if acutely is worse. No chest pain. Does not think his orthopneic.  Subjective: Paged by nursing staff that patient has a rectal temperature of 95 and they were unable to palpate any pulses.  When went to evaluate he was sitting in the bed eating his breakfast, stating that he was feeling cold and shivering the whole night as they changed his room and this new room was very cold.  He was already on a warming blanket and skin feels normal with palpable pulses.  Assessment & Plan:   Active Problems:   CHF (congestive heart failure) (HCC)   AKI (acute kidney injury) (HCC)   Testicle swelling  Acute on chronic systolic heart failure.  Echo with EF of 25 to 30%. Symptoms seem improving, scrotum still swollen, per patient also improving. Weight decreased to 190. pound from 222 on admission. Patient is under hospice care but wants to stay full code and would like to get some rehab. -PT/OT are recommending home health services. hospice will not provide PT services, we will see if he qualifies for SNF as he is very much interested in rehab.   Patient lives alone and cannot take care of himself. IV Lasix was discontinued yesterday after an increase in his creatinine. - start p.o. torsemide-holding for today due to slight increase in creatinine. -Continue to hold Coreg and Entresto due to softer blood pressure   Possible UTI.  His Foley was removed yesterday with some concern of hematuria.  He was able to void.  No urinary symptoms.  UA with large amount of leukocytes, RBCs and bacteria. - urine culture-pending. -Bladder scan to make sure that there is no retention. -Continue Rocephin.  Hypothermia.  Most likely secondary to staying in a cold room as skin temperature improves quickly with warm blankets.  Room temperature increased to keep him at comfortable level for him. -We will check blood cultures.  Elevated TSH.  TSH was ordered by night coverage when patient become little altered and hypoxic, which was most likely secondary to getting Vistaril and his oxygen cannula was on floor. TSH elevated at 7.297.  free T4 and T3.-Normal  BPH. -Continue tamsulosin.  Type 2 diabetes mellitus.  Well controlled with A1c of 5.9. -Continue carb modified diet and SSI.  AKI.  Most likely prerenal.  Creatinine worsening. -Hold Torsemide. -Monitor renal function. -Avoid nephrotoxins.   Objective: Vitals:   04/28/20 0740 04/28/20 0855 04/28/20 1033 04/28/20 1121  BP: 102/77   93/79  Pulse: 82   83  Resp: 19   18  Temp: 97.7 F (36.5 C) (!) 95.4 F (35.2 C) 98.1 F (36.7 C) (!) 97.4 F (36.3 C)  TempSrc: Oral Rectal Oral Oral  SpO2:  97%  100%  Weight:      Height:        Intake/Output Summary (Last 24 hours) at 04/28/2020 1403 Last data filed at 04/28/2020 1032 Gross per 24 hour  Intake 3 ml  Output 950 ml  Net -947 ml   Filed Weights   04/25/20 0347 04/26/20 0057 04/26/20 0406  Weight: 86.5 kg 101.7 kg 101.7 kg    Examination:  General exam: Appears calm and comfortable  Respiratory system: Clear to auscultation.  Respiratory effort normal. Cardiovascular system: S1 & S2 heard, RRR. No JVD, murmurs, rubs, gallops or clicks. Gastrointestinal system: Soft, nontender, nondistended, bowel sounds positive. Central nervous system: Alert and oriented. No focal neurological deficits. GU.  scrotal edema seems improving. Extremities: 1+ LE edema, no cyanosis, pulses intact and symmetrical. Psychiatry: Judgement and insight appear normal.     DVT prophylaxis: Heparin Code Status: Full Family Communication: Daughter was updated on phone. Disposition Plan:  Status is: Inpatient  Remains inpatient appropriate because:IV treatments appropriate due to intensity of illness or inability to take PO   Dispo: The patient is from: Home              Anticipated d/c is to: SNF if qualifies.              Anticipated d/c date is: 2 days              Patient currently is not medically stable to d/c.  Lives alone and is not safe to discharge him home at this time.  Had a long discussion with hospice and they are advising a placement, they will continue following him up.  PT are recommending SNF placement, TOC is working on it most likely will be done after weekend.  Consultants:   None  Procedures:  [place]  Antimicrobials:  Rocephin  Data Reviewed: I have personally reviewed following labs and imaging studies  CBC: Recent Labs  Lab 04/23/20 1206 04/25/20 0611 04/26/20 0524 04/28/20 0715  WBC 5.3 4.2 4.1 5.4  NEUTROABS 4.1  --   --   --   HGB 13.5 12.9* 13.3 13.7  HCT 41.4 38.0* 40.1 42.9  MCV 98.3 96.2 97.8 102.6*  PLT 177 172 169 152   Basic Metabolic Panel: Recent Labs  Lab 04/24/20 0706 04/25/20 0611 04/26/20 0524 04/27/20 0701 04/28/20 0553  NA 138 137 137 136 134*  K 3.7 4.0 3.9 4.1 4.6  CL 101 102 99 100 97*  CO2 25 24 26 26 25   GLUCOSE 84 98 99 95 93  BUN 31* 30* 35* 41* 45*  CREATININE 1.33* 1.48* 1.59* 1.55* 1.60*  CALCIUM 8.7* 8.5* 8.7* 8.7* 8.8*   GFR: Estimated Creatinine  Clearance: 41.8 mL/min (A) (by C-G formula based on SCr of 1.6 mg/dL (H)). Liver Function Tests: Recent Labs  Lab 04/23/20 1206 04/27/20 0701  AST 45* 25  ALT 78* 45*  ALKPHOS 44 51  BILITOT 1.5* 1.1  PROT 6.8 6.4*  ALBUMIN 3.7 3.6   No results for input(s): LIPASE, AMYLASE in the last 168 hours. No results for input(s): AMMONIA in the last 168 hours. Coagulation Profile: No results for input(s): INR, PROTIME in the last 168 hours. Cardiac Enzymes: No results for input(s): CKTOTAL, CKMB, CKMBINDEX, TROPONINI in the last 168 hours. BNP (last 3 results) No results for input(s): PROBNP in the last 8760 hours. HbA1C: No results for input(s): HGBA1C in the last 72 hours. CBG: Recent Labs  Lab 04/27/20 1658 04/27/20 1948 04/28/20 0002 04/28/20 0731 04/28/20 1129  GLUCAP 106* 87 78 70 71   Lipid Profile: No results for input(s): CHOL, HDL, LDLCALC, TRIG, CHOLHDL, LDLDIRECT in the last 72 hours. Thyroid Function Tests: Recent Labs    04/27/20 0701 04/27/20 1435  TSH 7.297*  --   T4TOTAL  --  5.5   Anemia Panel: No results for input(s): VITAMINB12, FOLATE, FERRITIN, TIBC, IRON, RETICCTPCT in the last 72 hours. Sepsis Labs: No results for input(s): PROCALCITON, LATICACIDVEN in the last 168 hours.  Recent Results (from the past 240 hour(s))  Respiratory Panel by RT PCR (Flu A&B, Covid) - Nasopharyngeal Swab     Status: None   Collection Time: 04/23/20  1:14 PM   Specimen: Nasopharyngeal Swab  Result Value Ref Range Status   SARS Coronavirus 2 by RT PCR NEGATIVE NEGATIVE Final    Comment: (NOTE) SARS-CoV-2 target nucleic acids are NOT DETECTED. The SARS-CoV-2 RNA is generally detectable in upper respiratoy specimens during the acute phase of infection. The lowest concentration of SARS-CoV-2 viral copies this assay can detect is 131 copies/mL. A negative result does not preclude SARS-Cov-2 infection and should not be used as the sole basis for treatment or other  patient management decisions. A negative result may occur with  improper specimen collection/handling, submission of specimen other than nasopharyngeal swab, presence of viral mutation(s) within the areas targeted by this assay, and inadequate number of viral copies (<131 copies/mL). A negative result must be combined with clinical observations, patient history, and epidemiological information. The expected result is Negative. Fact Sheet for Patients:  PinkCheek.be Fact Sheet for Healthcare Providers:  GravelBags.it This test is not yet ap proved or cleared by the [place] FDA and  has been authorized for detection and/or diagnosis of SARS-CoV-2 by FDA under an Emergency Use Authorization (EUA). This EUA will remain  in effect (meaning this test can be used) for the duration of the COVID-19 declaration under Section 564(b)(1) of the Act, 21 U.S.C. section 360bbb-3(b)(1), unless the authorization is terminated or revoked sooner.    Influenza A by PCR NEGATIVE NEGATIVE Final   Influenza B by PCR NEGATIVE NEGATIVE Final    Comment: (NOTE) The Xpert Xpress SARS-CoV-2/FLU/RSV assay is intended as an aid in  the diagnosis of influenza from Nasopharyngeal swab specimens and  should not be used as a sole basis for treatment. Nasal washings and  aspirates are unacceptable for Xpert Xpress SARS-CoV-2/FLU/RSV  testing. Fact Sheet for Patients: PinkCheek.be Fact Sheet for Healthcare Providers: GravelBags.it This test is not yet approved or cleared by the [place] FDA and  has been authorized for detection and/or diagnosis of SARS-CoV-2 by  FDA under an Emergency Use Authorization (EUA). This EUA will remain  in effect (meaning this test can be used) for the duration of the  Covid-19 declaration under Section 564(b)(1) of the Act, 21  U.S.C. section 360bbb-3(b)(1), unless  the authorization is  terminated or revoked. Performed at [facility], [address], [place], [place] [address]      Radiology Studies: DG Chest 1 View  Result Date: 04/27/2020 CLINICAL DATA:  Hypoxia. EXAM: CHEST  1 VIEW COMPARISON:  04/17/2020. FINDINGS: Severe cardiomegaly again noted. No prominent pulmonary venous congestion. Persistent left base atelectasis/infiltrate and small left pleural effusion. Slight improvement in aeration from prior exam. No pneumothorax. Degenerative change thoracic spine. IMPRESSION: 1. Severe cardiomegaly again noted. No prominent pulmonary venous congestion. 2. Persistent left base atelectasis/infiltrate and small left pleural  effusion. Slight improvement in aeration from prior exam Electronically Signed   By: Maisie Fus  Register   On: 04/27/2020 07:42    Scheduled Meds: . aspirin EC  81 mg Oral Daily  . carvedilol  3.125 mg Oral BID WC  . Chlorhexidine Gluconate Cloth  6 each Topical Daily  . enoxaparin (LOVENOX) injection  40 mg Subcutaneous Q24H  . insulin aspart  0-15 Units Subcutaneous Q4H  . multivitamin-lutein  1 capsule Oral Daily  . Ensure Max Protein  11 oz Oral BID  . QUEtiapine  25 mg Oral QHS  . simvastatin  40 mg Oral QHS  . sodium chloride flush  3 mL Intravenous Q12H  . spironolactone  25 mg Oral Daily  . tamsulosin  0.4 mg Oral QHS   Continuous Infusions: . sodium chloride    . cefTRIAXone (ROCEPHIN)  IV Stopped (04/27/20 1707)     LOS: 5 days   Time spent:40  Minutes.  Arnetha Courser, MD [facility]  If 7PM-7AM, please contact night-coverage [email or web address]  04/28/2020, 2:03 PM   This record has been created using Conservation officer, historic buildings. Errors have been sought and corrected,but may not always be located. Such creation errors do not reflect on the standard of care.

## 2020-04-29 ENCOUNTER — Inpatient Hospital Stay

## 2020-04-29 LAB — CBC
HCT: 37 % — ABNORMAL LOW (ref 39.0–52.0)
Hemoglobin: 12.4 g/dL — ABNORMAL LOW (ref 13.0–17.0)
MCH: 32.5 pg (ref 26.0–34.0)
MCHC: 33.5 g/dL (ref 30.0–36.0)
MCV: 96.9 fL (ref 80.0–100.0)
Platelets: 145 10*3/uL — ABNORMAL LOW (ref 150–400)
RBC: 3.82 MIL/uL — ABNORMAL LOW (ref 4.22–5.81)
RDW: 16.6 % — ABNORMAL HIGH (ref 11.5–15.5)
WBC: 4.8 10*3/uL (ref 4.0–10.5)
nRBC: 0.6 % — ABNORMAL HIGH (ref 0.0–0.2)

## 2020-04-29 LAB — RENAL FUNCTION PANEL
Albumin: 3.7 g/dL (ref 3.5–5.0)
Anion gap: 12 (ref 5–15)
BUN: 53 mg/dL — ABNORMAL HIGH (ref 8–23)
CO2: 26 mmol/L (ref 22–32)
Calcium: 9.2 mg/dL (ref 8.9–10.3)
Chloride: 98 mmol/L (ref 98–111)
Creatinine, Ser: 1.61 mg/dL — ABNORMAL HIGH (ref 0.61–1.24)
GFR calc Af Amer: 46 mL/min — ABNORMAL LOW (ref >60)
GFR calc non Af Amer: 40 mL/min — ABNORMAL LOW (ref >60)
Glucose, Bld: 92 mg/dL (ref 70–99)
Phosphorus: 4.6 mg/dL (ref 2.5–4.6)
Potassium: 4.8 mmol/L (ref 3.5–5.1)
Sodium: 136 mmol/L (ref 135–145)

## 2020-04-29 LAB — GLUCOSE, CAPILLARY
Glucose-Capillary: 76 mg/dL (ref 70–99)
Glucose-Capillary: 60 mg/dL — ABNORMAL LOW (ref 70–99)
Glucose-Capillary: 72 mg/dL (ref 70–99)
Glucose-Capillary: 58 mg/dL — ABNORMAL LOW (ref 70–99)
Glucose-Capillary: 95 mg/dL (ref 70–99)
Glucose-Capillary: 58 mg/dL — ABNORMAL LOW (ref 70–99)
Glucose-Capillary: 71 mg/dL (ref 70–99)

## 2020-04-29 MED ORDER — TORSEMIDE 20 MG PO TABS
40.0000 mg | ORAL_TABLET | Freq: Every day | ORAL | Status: DC
  Administered 2020-04-29: 40 mg via ORAL
  Filled 2020-04-29: qty 2

## 2020-04-29 NOTE — Progress Notes (Signed)
CBG is 60.  Pt is agitated and uncooperative.  Pt refuses to take PO or IV intervention to elevate blood glucose level.  Calming techniques used, but without effect.

## 2020-04-29 NOTE — Plan of Care (Signed)
Pt calm and cooperative after his son, Thereasa Distance, visited at bedside.  Problem: Health Behavior/Discharge Planning: Goal: Ability to manage health-related needs will improve Outcome: Progressing   Problem: Clinical Measurements: Goal: Ability to maintain clinical measurements within normal limits will improve Outcome: Progressing Goal: Will remain free from infection Outcome: Progressing Goal: Diagnostic test results will improve Outcome: Progressing Goal: Respiratory complications will improve Outcome: Progressing Goal: Cardiovascular complication will be avoided Outcome: Progressing

## 2020-04-29 NOTE — Progress Notes (Signed)
PROGRESS NOTE    Trevor Rodriguez.  MPN:[id number] DOB: Mar 08, 1941 DOA: 04/23/2020 PCP: Marletta Lor, NP   Brief Narrative:  Trevor Rodriguezis a 79 y.o.malewith medical history significant ofPatient is a 79 year old male with history of hypertension, hyperlipidemia,chronic systolic heart failure with EF of 25 to 30% from 06/2019 presents complaining of scrotal swellinglast few days.She was seen in the ED on 4/27 found with BNP of 2470. Scrotal ultrasound showed diffuse scrotal wall thickening with bilateral hydrocele likely due to CHF no evidence of torsion no evidence of infection. He was given IV Lasix and discharged home. He was recommended to take Lasix 40 mg p.o. twice daily for the next 3 days. Please note patient is on hospice care. He tells that he is taking his Lasix. He does report having shortness of breath but unable to tell me if acutely is worse. No chest pain. Does not think his orthopneic.  Subjective: Patient become quite aggressive this morning, cursing nursing staff and security was called.  During morning rounds he was little, stating that some young black lady was cursing him that made him very upset.  He promised to stay compliant with treatment and directions.  Assessment & Plan:   Active Problems:   CHF (congestive heart failure) (HCC)   AKI (acute kidney injury) (HCC)   Testicle swelling  Acute on chronic systolic heart failure.  Echo with EF of 25 to 30%. Symptoms seem improving, scrotum still swollen, per patient also improving. Weight decreased to 190. pound from 222 on admission. Patient is under hospice care but wants to stay full code and would like to get some rehab. -PT/OT reevaluated him and are recommending SNF placement.  Patient lives alone and cannot take care of himself. IV Lasix was discontinued  after an increase in his creatinine.  Torsemide was held yesterday due to slight increase in creatinine which seems stable today. - start  p.o. torsemide. -Continue to hold Coreg and Entresto due to softer blood pressure   Possible UTI.  His Foley was removed yesterday with some concern of hematuria.  He was able to void.  No urinary symptoms.  UA with large amount of leukocytes, RBCs and bacteria. - urine culture-growing Klebsiella, pending susceptibility results. -Bladder scan to make sure that there is no retention. -Continue Rocephin-we will adjust antibiotics according to sensitivity results  Hypothermia.  Resolved.  Most likely secondary to staying in a cold room as skin temperature improves quickly with warm blankets.  Room temperature increased to keep him at comfortable level for him. -We will check blood cultures.  Elevated TSH.  TSH was ordered by night coverage when patient become little altered and hypoxic, which was most likely secondary to getting Vistaril and his oxygen cannula was on floor. TSH elevated at 7.297.  free T4 and T3.-Normal Most likely sick euthyroid. -Needed repeat testing in 4 weeks.  BPH. -Continue tamsulosin.  Type 2 diabetes mellitus.  Well controlled with A1c of 5.9. -Continue carb modified diet and SSI.  AKI.  Most likely prerenal.  Creatinine stable. -Restart torsemide. -Monitor renal function. -Avoid nephrotoxins.  Objective: Vitals:   04/28/20 2001 04/29/20 0419 04/29/20 0725 04/29/20 1148  BP: 99/78 109/85 99/71 97/74   Pulse: 81 85 92 79  Resp: 16 18 20 17   Temp: 97.6 F (36.4 C) (!) 97.4 F (36.3 C) 97.6 F (36.4 C) 97.9 F (36.6 C)  TempSrc: Oral Oral Oral Oral  SpO2: 100% 100% 100% 99%  Weight:  102.6 kg  Height:        Intake/Output Summary (Last 24 hours) at 04/29/2020 1415 Last data filed at 04/29/2020 1100 Gross per 24 hour  Intake 100 ml  Output 300 ml  Net -200 ml   Filed Weights   04/26/20 0057 04/26/20 0406 04/29/20 0419  Weight: 101.7 kg 101.7 kg 102.6 kg    Examination:  General exam: Appears calm and comfortable  Respiratory system: Clear  to auscultation. Respiratory effort normal. Cardiovascular system: S1 & S2 heard, RRR. No JVD, murmurs, rubs, gallops or clicks. Gastrointestinal system: Soft, nontender, nondistended, bowel sounds positive. Central nervous system: Alert and oriented. No focal neurological deficits. GU.  scrotal edema seems improving. Extremities: 2+ LE edema, no cyanosis, pulses intact and symmetrical. Psychiatry: Judgement and insight appear normal.     DVT prophylaxis: Heparin Code Status: Full Family Communication: Son was updated at bedside. Disposition Plan:  Status is: Inpatient  Remains inpatient appropriate because: Unsafe discharge.  Waiting for SNF placement.  Dispo: The patient is from: Home              Anticipated d/c is to: SNF              Anticipated d/c date is: 2 days              Patient currently is not medically stable to d/c.  Lives alone and is not safe to discharge him home at this time.  Had a long discussion with hospice and they are advising a placement, they will continue following him up.  PT are recommending SNF placement, TOC is working on it most likely will be done after weekend.  Consultants:   None  Procedures:  [place]  Antimicrobials:  Rocephin  Data Reviewed: I have personally reviewed following labs and imaging studies  CBC: Recent Labs  Lab 04/23/20 1206 04/25/20 0611 04/26/20 0524 04/28/20 0715 04/29/20 0616  WBC 5.3 4.2 4.1 5.4 4.8  NEUTROABS 4.1  --   --   --   --   HGB 13.5 12.9* 13.3 13.7 12.4*  HCT 41.4 38.0* 40.1 42.9 37.0*  MCV 98.3 96.2 97.8 102.6* 96.9  PLT 177 172 169 152 145*   Basic Metabolic Panel: Recent Labs  Lab 04/25/20 0611 04/26/20 0524 04/27/20 0701 04/28/20 0553 04/29/20 0616  NA 137 137 136 134* 136  K 4.0 3.9 4.1 4.6 4.8  CL 102 99 100 97* 98  CO2 24 26 26 25 26   GLUCOSE 98 99 95 93 92  BUN 30* 35* 41* 45* 53*  CREATININE 1.48* 1.59* 1.55* 1.60* 1.61*  CALCIUM 8.5* 8.7* 8.7* 8.8* 9.2  PHOS  --   --   --   --   4.6   GFR: Estimated Creatinine Clearance: 41.7 mL/min (A) (by C-G formula based on SCr of 1.61 mg/dL (H)). Liver Function Tests: Recent Labs  Lab 04/23/20 1206 04/27/20 0701 04/29/20 0616  AST 45* 25  --   ALT 78* 45*  --   ALKPHOS 44 51  --   BILITOT 1.5* 1.1  --   PROT 6.8 6.4*  --   ALBUMIN 3.7 3.6 3.7   No results for input(s): LIPASE, AMYLASE in the last 168 hours. No results for input(s): AMMONIA in the last 168 hours. Coagulation Profile: No results for input(s): INR, PROTIME in the last 168 hours. Cardiac Enzymes: No results for input(s): CKTOTAL, CKMB, CKMBINDEX, TROPONINI in the last 168 hours. BNP (last 3 results) No results for input(s): PROBNP in the  last 8760 hours. HbA1C: No results for input(s): HGBA1C in the last 72 hours. CBG: Recent Labs  Lab 04/28/20 1622 04/28/20 2036 04/29/20 0416 04/29/20 0726 04/29/20 1148  GLUCAP 115* 87 76 60* 72   Lipid Profile: No results for input(s): CHOL, HDL, LDLCALC, TRIG, CHOLHDL, LDLDIRECT in the last 72 hours. Thyroid Function Tests: Recent Labs    04/27/20 0701 04/27/20 1435  TSH 7.297*  --   T4TOTAL  --  5.5   Anemia Panel: No results for input(s): VITAMINB12, FOLATE, FERRITIN, TIBC, IRON, RETICCTPCT in the last 72 hours. Sepsis Labs: No results for input(s): PROCALCITON, LATICACIDVEN in the last 168 hours.  Recent Results (from the past 240 hour(s))  Respiratory Panel by RT PCR (Flu A&B, Covid) - Nasopharyngeal Swab     Status: None   Collection Time: 04/23/20  1:14 PM   Specimen: Nasopharyngeal Swab  Result Value Ref Range Status   SARS Coronavirus 2 by RT PCR NEGATIVE NEGATIVE Final    Comment: (NOTE) SARS-CoV-2 target nucleic acids are NOT DETECTED. The SARS-CoV-2 RNA is generally detectable in upper respiratoy specimens during the acute phase of infection. The lowest concentration of SARS-CoV-2 viral copies this assay can detect is 131 copies/mL. A negative result does not preclude  SARS-Cov-2 infection and should not be used as the sole basis for treatment or other patient management decisions. A negative result may occur with  improper specimen collection/handling, submission of specimen other than nasopharyngeal swab, presence of viral mutation(s) within the areas targeted by this assay, and inadequate number of viral copies (<131 copies/mL). A negative result must be combined with clinical observations, patient history, and epidemiological information. The expected result is Negative. Fact Sheet for Patients:  Trevor Rodriguez Fact Sheet for Healthcare Providers:  [employer] This test is not yet ap proved or cleared by the [place] FDA and  has been authorized for detection and/or diagnosis of SARS-CoV-2 by FDA under an Emergency Use Authorization (EUA). This EUA will remain  in effect (meaning this test can be used) for the duration of the COVID-19 declaration under Section 564(b)(1) of the Act, 21 U.S.C. section 360bbb-3(b)(1), unless the authorization is terminated or revoked sooner.    Influenza A by PCR NEGATIVE NEGATIVE Final   Influenza B by PCR NEGATIVE NEGATIVE Final    Comment: (NOTE) The Xpert Xpress SARS-CoV-2/FLU/RSV assay is intended as an aid in  the diagnosis of influenza from Nasopharyngeal swab specimens and  should not be used as a sole basis for treatment. Nasal washings and  aspirates are unacceptable for Xpert Xpress SARS-CoV-2/FLU/RSV  testing. Fact Sheet for Patients: Trevor Rodriguez Fact Sheet for Healthcare Providers: [employer] This test is not yet approved or cleared by the [place] FDA and  has been authorized for detection and/or diagnosis of SARS-CoV-2 by  FDA under an Emergency Use Authorization (EUA). This EUA will remain  in effect (meaning this test can be used) for the duration of the  Covid-19  declaration under Section 564(b)(1) of the Act, 21  U.S.C. section 360bbb-3(b)(1), unless the authorization is  terminated or revoked. Performed at [facility], [address] [place], [place] [address]   Urine Culture     Status: Abnormal (Preliminary result)   Collection Time: 04/27/20  9:21 AM   Specimen: Urine, Random  Result Value Ref Range Status   Specimen Description   Final    URINE, RANDOM Performed at [facility], [address], [place],  [address]    Special Requests  Final    NONE Performed at [facility], [address] [place], [place] [address]    Culture (A)  Final    >=100,000 COLONIES/mL KLEBSIELLA ORNITHINOLYTICA SUSCEPTIBILITIES TO FOLLOW Performed at [facility], [address], [place], [place] [address]    Report Status PENDING  Incomplete  CULTURE, BLOOD (ROUTINE X 2) w Reflex to ID Panel     Status: None (Preliminary result)   Collection Time: 04/28/20 10:40 AM   Specimen: BLOOD  Result Value Ref Range Status   Specimen Description BLOOD LEFT ANTECUBITAL  Final   Special Requests   Final    BOTTLES DRAWN AEROBIC AND ANAEROBIC Blood Culture adequate volume   Culture   Final    NO GROWTH < 24 HOURS Performed at [facility], [address] [place], [place] [address]    Report Status PENDING  Incomplete  CULTURE, BLOOD (ROUTINE X 2) w Reflex to ID Panel     Status: None (Preliminary result)   Collection Time: 04/28/20 10:40 AM   Specimen: BLOOD  Result Value Ref Range Status   Specimen Description BLOOD BLOOD LEFT HAND  Final   Special Requests   Final    BOTTLES DRAWN AEROBIC AND ANAEROBIC Blood Culture adequate volume   Culture   Final    NO GROWTH < 24 HOURS Performed at [facility], [address], [place], [place] [address]    Report Status PENDING  Incomplete     Radiology Studies: No results found.  Scheduled Meds: . aspirin EC  81 mg Oral Daily  . carvedilol   3.125 mg Oral BID WC  . Chlorhexidine Gluconate Cloth  6 each Topical Daily  . enoxaparin (LOVENOX) injection  40 mg Subcutaneous Q24H  . insulin aspart  0-15 Units Subcutaneous Q4H  . multivitamin-lutein  1 capsule Oral Daily  . Ensure Max Protein  11 oz Oral BID  . QUEtiapine  25 mg Oral QHS  . simvastatin  40 mg Oral QHS  . sodium chloride flush  3 mL Intravenous Q12H  . spironolactone  25 mg Oral Daily  . tamsulosin  0.4 mg Oral QHS  . torsemide  40 mg Oral Daily   Continuous Infusions: . sodium chloride    . cefTRIAXone (ROCEPHIN)  IV Stopped (04/28/20 2035)     LOS: 6 days   Time spent:40  Minutes.  Arnetha Courser, MD [facility]  If 7PM-7AM, please contact night-coverage [email or web address]  04/29/2020, 2:15 PM   This record has been created using Conservation officer, historic buildings. Errors have been sought and corrected,but may not always be located. Such creation errors do not reflect on the standard of care.

## 2020-04-29 NOTE — Progress Notes (Signed)
NP Cliffton Asters bedside at this time to assess patient.

## 2020-04-29 NOTE — Progress Notes (Addendum)
Emergency contact Kathrin Greathouse, daughter, notified of fall via telephone call. Will update as needed.

## 2020-04-29 NOTE — Progress Notes (Signed)
Was the fall witnessed:No Patient condition before and after the fall: asleep in recliner  Patient's reaction to the fall: " I was getting in bed"   Name of the doctor that was notified including date and time: NP B.Jon Billings Any interventions and vital signs: BP 105/73 p 76 T 98.0 R 18 SPO2 97% 4LNC.   Awaiting response from NP. Patient currently in bed resting peacefully. Denies any pain or discomfort at this time. No injuries noted. Bed alarm on. Will continue to monitor to ensure comfort and safety.

## 2020-04-29 NOTE — Progress Notes (Addendum)
Pt becoming verbally and physically aggressive.  Pt attempting to break window with walker.  Pt hollering at all staff that he is leaving. Security called for safety.  Dr. Nelson Chimes paged for changed in mental status.  Called pt's daughter, Meriam Sprague, to come to bedside.  Meriam Sprague is currently out of town, but will try to get her brother to come.

## 2020-04-30 LAB — GLUCOSE, CAPILLARY
Glucose-Capillary: 103 mg/dL — ABNORMAL HIGH (ref 70–99)
Glucose-Capillary: 77 mg/dL (ref 70–99)
Glucose-Capillary: 75 mg/dL (ref 70–99)
Glucose-Capillary: 111 mg/dL — ABNORMAL HIGH (ref 70–99)
Glucose-Capillary: 68 mg/dL — ABNORMAL LOW (ref 70–99)
Glucose-Capillary: 91 mg/dL (ref 70–99)
Glucose-Capillary: 71 mg/dL (ref 70–99)

## 2020-04-30 LAB — BASIC METABOLIC PANEL
Anion gap: 16 — ABNORMAL HIGH (ref 5–15)
BUN: 55 mg/dL — ABNORMAL HIGH (ref 8–23)
CO2: 21 mmol/L — ABNORMAL LOW (ref 22–32)
Calcium: 9 mg/dL (ref 8.9–10.3)
Chloride: 99 mmol/L (ref 98–111)
Creatinine, Ser: 1.8 mg/dL — ABNORMAL HIGH (ref 0.61–1.24)
GFR calc Af Amer: 41 mL/min — ABNORMAL LOW (ref >60)
GFR calc non Af Amer: 35 mL/min — ABNORMAL LOW (ref >60)
Glucose, Bld: 89 mg/dL (ref 70–99)
Potassium: 5.1 mmol/L (ref 3.5–5.1)
Sodium: 136 mmol/L (ref 135–145)

## 2020-04-30 LAB — CBC
HCT: 35.3 % — ABNORMAL LOW (ref 39.0–52.0)
Hemoglobin: 12 g/dL — ABNORMAL LOW (ref 13.0–17.0)
MCH: 32.9 pg (ref 26.0–34.0)
MCHC: 34 g/dL (ref 30.0–36.0)
MCV: 96.7 fL (ref 80.0–100.0)
Platelets: 118 10*3/uL — ABNORMAL LOW (ref 150–400)
RBC: 3.65 MIL/uL — ABNORMAL LOW (ref 4.22–5.81)
RDW: 16.9 % — ABNORMAL HIGH (ref 11.5–15.5)
WBC: 6 10*3/uL (ref 4.0–10.5)
nRBC: 0.3 % — ABNORMAL HIGH (ref 0.0–0.2)

## 2020-04-30 LAB — URINE CULTURE: Culture: >=100000 — AB

## 2020-04-30 MED ORDER — CEPHALEXIN 250 MG PO CAPS
250.0000 mg | ORAL_CAPSULE | Freq: Four times a day (QID) | ORAL | Status: DC
  Administered 2020-04-30 – 2020-05-01 (×2): 250 mg via ORAL
  Filled 2020-04-30 (×6): qty 1

## 2020-04-30 MED ORDER — GUAIFENESIN-CODEINE 100-10 MG/5ML PO SOLN
10.0000 mL | ORAL | Status: DC | PRN
  Administered 2020-04-30: 10 mL via ORAL
  Filled 2020-04-30: qty 10

## 2020-04-30 MED ORDER — INSULIN ASPART 100 UNIT/ML ~~LOC~~ SOLN: 0.0000 [IU] | Freq: Three times a day (TID) | SUBCUTANEOUS | Status: DC

## 2020-04-30 MED ORDER — MELATONIN 5 MG PO TABS
5.0000 mg | ORAL_TABLET | Freq: Every day | ORAL | Status: DC
  Administered 2020-04-30 (×2): 5 mg via ORAL
  Filled 2020-04-30 (×2): qty 1

## 2020-04-30 MED ORDER — TORSEMIDE 20 MG PO TABS
20.0000 mg | ORAL_TABLET | Freq: Every day | ORAL | Status: DC
  Administered 2020-04-30 – 2020-05-01 (×2): 20 mg via ORAL
  Filled 2020-04-30 (×2): qty 1

## 2020-04-30 NOTE — Progress Notes (Signed)
Patient complaining  of headache. PRN tylenol administered.

## 2020-04-30 NOTE — Progress Notes (Signed)
Patient off unit to CT at this time.  

## 2020-04-30 NOTE — Progress Notes (Signed)
Order received from Dr Nelson Chimes to change CBG from q4h to ac and hs

## 2020-04-30 NOTE — Progress Notes (Signed)
PT Cancellation Note  Patient Details Name: Trevor Rodriguez. MRN: [id number] DOB: 25-Aug-1941   Cancelled Treatment:    Reason Eval/Treat Not Completed: Other (comment)(Per chart, family meeting to be held to determine goals of care, plan of care. PT to follow up when plan has been established.)  Olga Coaster PT, DPT 2:03 PM,04/30/20

## 2020-04-30 NOTE — Progress Notes (Signed)
PROGRESS NOTE    Trevor Rodriguez.  BOF:[id number] DOB: 1941-08-23 DOA: 04/23/2020 PCP: Marletta Lor, NP   Brief Narrative:  Trevor Rodriguezis a 79 y.o.malewith medical history significant ofPatient is a 79 year old male with history of hypertension, hyperlipidemia,chronic systolic heart failure with EF of 25 to 30% from 06/2019 presents complaining of scrotal swellinglast few days.She was seen in the ED on 4/27 found with BNP of 2470. Scrotal ultrasound showed diffuse scrotal wall thickening with bilateral hydrocele likely due to CHF no evidence of torsion no evidence of infection. He was given IV Lasix and discharged home. He was recommended to take Lasix 40 mg p.o. twice daily for the next 3 days. Please note patient is on hospice care. He tells that he is taking his Lasix. He does report having shortness of breath but unable to tell me if acutely is worse. No chest pain. Does not think his orthopneic.  Subjective: Patient was feeling little lethargic when seen today.  He had a fall overnight and was quite agitated per nursing staff.  He had a fall while trying to get out of bed himself.  No acute injuries.  CT head without any acute findings.  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  Echo with EF of 25 to 30%. Symptoms seem improving, scrotum still swollen, per patient also improving. Weight decreased to 190. pound from 222 on admission. Patient is under hospice care but wants to stay full code and would like to get some rehab. -PT/OT reevaluated him and are recommending SNF placement.  Patient lives alone and cannot take care of himself.   Worsening of renal function as he was started on torsemide yesterday due to worsening edema. -Decrease the dose of torsemide to 20 mg daily. -Continue to hold Coreg and Entresto  due to softer blood pressure. Hospice services had a family meeting today as patient is with cardiorenal syndrome and a significant decline in his functional status.  Family decided to take him back home with comfort measures.  Patient will remain full code as they are trying to process this and will let us know when they are ready to move to DNR.  Possible UTI.  His Foley was removed yesterday with some concern of hematuria.  He was able to void.  No urinary symptoms.  UA with large amount of leukocytes, RBCs and bacteria. - urine culture-growing Klebsiella with good sensitivity -Bladder scan to make sure that there is no retention. -Switch ceftriaxone with Keflex to complete a total of 7-day course.  Hypothermia.  Resolved.  Most likely secondary to staying in a cold room as skin temperature improves quickly with warm blankets.  Room temperature increased to keep him at comfortable level for him. -We will check blood cultures.  Elevated TSH.  TSH was ordered by night coverage when patient become little altered and hypoxic, which was most likely secondary to getting Vistaril and his oxygen cannula was on floor. TSH elevated at 7.297.  free T4 and T3.-Normal Most likely sick euthyroid. -Needed repeat testing in 4 weeks.  BPH. -Continue tamsulosin.  Type 2 diabetes mellitus.  Well controlled with A1c of 5.9. -Continue carb modified diet and SSI.  AKI.  Most likely prerenal/cardiorenal syndrome.  Creatinine worsened again today with torsemide.  Difficult situation as he needs diuretic due to worsening edema. -Decrease the dose of  torsemide. -Monitor renal function. -Avoid nephrotoxins.  Objective: Vitals:   04/30/20 0035 04/30/20 0306 04/30/20 0720 04/30/20 1129  BP: 97/76 117/77 113/81 (!) 104/49  Pulse: 81 80 75 78  Resp:  18 16 16   Temp: 97.9 F (36.6 C) 98.1 F (36.7 C) 97.9 F (36.6 C) 97.8 F (36.6 C)  TempSrc:   Oral Oral  SpO2:   95% 95%  Weight:  102 kg    Height:         Intake/Output Summary (Last 24 hours) at 04/30/2020 1332 Last data filed at 04/30/2020 1230 Gross per 24 hour  Intake 240 ml  Output 940 ml  Net -700 ml   Filed Weights   04/26/20 0406 04/29/20 0419 04/30/20 0306  Weight: 101.7 kg 102.6 kg 102 kg    Examination:  General exam: Appears calm and comfortable  Respiratory system: Clear to auscultation. Respiratory effort normal. Cardiovascular system: S1 & S2 heard, RRR. No JVD, murmurs, rubs, gallops or clicks. Gastrointestinal system: Soft, nontender, nondistended, bowel sounds positive. Central nervous system: Alert and oriented. No focal neurological deficits. GU.  scrotal edema seems improving. Extremities: 2+ LE edema, no cyanosis, pulses intact and symmetrical. Psychiatry: Judgement and insight appear normal.     DVT prophylaxis: Heparin Code Status: Full Family Communication: Daughter and son was updated on phone. Disposition Plan:  Status is: Inpatient  Remains inpatient appropriate because: Unsafe discharge.  Waiting for SNF placement.  Dispo: The patient is from: Home              Anticipated d/c is to: SNF              Anticipated d/c date is: 2 days              Patient currently is not medically stable to d/c.  After another meeting of hospice care services with family today, family decided to move him to home with comfort measures.  They need a day to prepare house.  Most likely be discharged tomorrow to home with hospice care and comfort measures. Significant decline in his functional status during his current hospitalization.  Difficult situation with cardiorenal syndrome.  Unable to diurese well without compromising whole lot of kidney function.  Not a candidate for dialysis.  Consultants:   None  Procedures:  06/30/20  Antimicrobials:  Keflex  Data Reviewed: I have personally reviewed following labs and imaging studies  CBC: Recent Labs  Lab 04/25/20 0611 04/26/20 0524 04/28/20 0715 04/29/20 0616  04/30/20 0653  WBC 4.2 4.1 5.4 4.8 6.0  HGB 12.9* 13.3 13.7 12.4* 12.0*  HCT 38.0* 40.1 42.9 37.0* 35.3*  MCV 96.2 97.8 102.6* 96.9 96.7  PLT 172 169 152 145* 118*   Basic Metabolic Panel: Recent Labs  Lab 04/26/20 0524 04/27/20 0701 04/28/20 0553 04/29/20 0616 04/30/20 0653  NA 137 136 134* 136 136  K 3.9 4.1 4.6 4.8 5.1  CL 99 100 97* 98 99  CO2 26 26 25 26  21*  GLUCOSE 99 95 93 92 89  BUN 35* 41* 45* 53* 55*  CREATININE 1.59* 1.55* 1.60* 1.61* 1.80*  CALCIUM 8.7* 8.7* 8.8* 9.2 9.0  PHOS  --   --   --  4.6  --    GFR: Estimated Creatinine Clearance: 37.2 mL/min (A) (by C-G formula based on SCr of 1.8 mg/dL (H)). Liver Function Tests: Recent Labs  Lab 04/27/20 0701 04/29/20 0616  AST 25  --   ALT 45*  --   ALKPHOS 51  --  BILITOT 1.1  --   PROT 6.4*  --   ALBUMIN 3.6 3.7   No results for input(s): LIPASE, AMYLASE in the last 168 hours. No results for input(s): AMMONIA in the last 168 hours. Coagulation Profile: No results for input(s): INR, PROTIME in the last 168 hours. Cardiac Enzymes: No results for input(s): CKTOTAL, CKMB, CKMBINDEX, TROPONINI in the last 168 hours. BNP (last 3 results) No results for input(s): PROBNP in the last 8760 hours. HbA1C: No results for input(s): HGBA1C in the last 72 hours. CBG: Recent Labs  Lab 04/29/20 2322 04/30/20 0508 04/30/20 0721 04/30/20 0830 04/30/20 1126  GLUCAP 71 103* 77 75 111*   Lipid Profile: No results for input(s): CHOL, HDL, LDLCALC, TRIG, CHOLHDL, LDLDIRECT in the last 72 hours. Thyroid Function Tests: Recent Labs    04/27/20 1435  T4TOTAL 5.5   Anemia Panel: No results for input(s): VITAMINB12, FOLATE, FERRITIN, TIBC, IRON, RETICCTPCT in the last 72 hours. Sepsis Labs: No results for input(s): PROCALCITON, LATICACIDVEN in the last 168 hours.  Recent Results (from the past 240 hour(s))  Respiratory Panel by RT PCR (Flu A&B, Covid) - Nasopharyngeal Swab     Status: None   Collection Time:  04/23/20  1:14 PM   Specimen: Nasopharyngeal Swab  Result Value Ref Range Status   SARS Coronavirus 2 by RT PCR NEGATIVE NEGATIVE Final    Comment: (NOTE) SARS-CoV-2 target nucleic acids are NOT DETECTED. The SARS-CoV-2 RNA is generally detectable in upper respiratoy specimens during the acute phase of infection. The lowest concentration of SARS-CoV-2 viral copies this assay can detect is 131 copies/mL. A negative result does not preclude SARS-Cov-2 infection and should not be used as the sole basis for treatment or other patient management decisions. A negative result may occur with  improper specimen collection/handling, submission of specimen other than nasopharyngeal swab, presence of viral mutation(s) within the areas targeted by this assay, and inadequate number of viral copies (<131 copies/mL). A negative result must be combined with clinical observations, patient history, and epidemiological information. The expected result is Negative. Fact Sheet for Patients:  https://www.moore.com/ Fact Sheet for Healthcare Providers:  https://www.young.biz/ This test is not yet ap proved or cleared by the [place] FDA and  has been authorized for detection and/or diagnosis of SARS-CoV-2 by FDA under an Emergency Use Authorization (EUA). This EUA will remain  in effect (meaning this test can be used) for the duration of the COVID-19 declaration under Section 564(b)(1) of the Act, 21 U.S.C. section 360bbb-3(b)(1), unless the authorization is terminated or revoked sooner.    Influenza A by PCR NEGATIVE NEGATIVE Final   Influenza B by PCR NEGATIVE NEGATIVE Final    Comment: (NOTE) The Xpert Xpress SARS-CoV-2/FLU/RSV assay is intended as an aid in  the diagnosis of influenza from Nasopharyngeal swab specimens and  should not be used as a sole basis for treatment. Nasal washings and  aspirates are unacceptable for Xpert Xpress SARS-CoV-2/FLU/RSV    testing. Fact Sheet for Patients: https://www.moore.com/ Fact Sheet for Healthcare Providers: https://www.young.biz/ This test is not yet approved or cleared by the [place] FDA and  has been authorized for detection and/or diagnosis of SARS-CoV-2 by  FDA under an Emergency Use Authorization (EUA). This EUA will remain  in effect (meaning this test can be used) for the duration of the  Covid-19 declaration under Section 564(b)(1) of the Act, 21  U.S.C. section 360bbb-3(b)(1), unless the authorization is  terminated or revoked. Performed at [facility], [phone]  [address] [place], [place] [address]   Urine Culture     Status: Abnormal   Collection Time: 04/27/20  9:21 AM   Specimen: Urine, Random  Result Value Ref Range Status   Specimen Description   Final    URINE, RANDOM Performed at [facility], [address] [place], [place] [address]    Special Requests   Final    NONE Performed at [facility], [address] [place], [place] [address]    Culture >=100,000 COLONIES/mL KLEBSIELLA ORNITHINOLYTICA (A)  Final   Report Status 04/30/2020 FINAL  Final   Organism ID, Bacteria KLEBSIELLA ORNITHINOLYTICA (A)  Final      Susceptibility   Klebsiella ornithinolytica - MIC*    AMPICILLIN >=32 RESISTANT Resistant     CEFAZOLIN <=4 SENSITIVE Sensitive     CEFTRIAXONE <=1 SENSITIVE Sensitive     CIPROFLOXACIN <=0.25 SENSITIVE Sensitive     GENTAMICIN <=1 SENSITIVE Sensitive     IMIPENEM <=0.25 SENSITIVE Sensitive     NITROFURANTOIN <=16 SENSITIVE Sensitive     TRIMETH/SULFA <=20 SENSITIVE Sensitive     AMPICILLIN/SULBACTAM 8 SENSITIVE Sensitive     PIP/TAZO 8 SENSITIVE Sensitive     * >=100,000 COLONIES/mL KLEBSIELLA ORNITHINOLYTICA  CULTURE, BLOOD (ROUTINE X 2) w Reflex to ID Panel     Status: None (Preliminary result)   Collection Time: 04/28/20 10:40 AM   Specimen: BLOOD  Result Value Ref Range Status    Specimen Description BLOOD LEFT ANTECUBITAL  Final   Special Requests   Final    BOTTLES DRAWN AEROBIC AND ANAEROBIC Blood Culture adequate volume   Culture   Final    NO GROWTH 2 DAYS Performed at [facility], [address] [place],  [address]    Report Status PENDING  Incomplete  CULTURE, BLOOD (ROUTINE X 2) w Reflex to ID Panel     Status: None (Preliminary result)   Collection Time: 04/28/20 10:40 AM   Specimen: BLOOD  Result Value Ref Range Status   Specimen Description BLOOD BLOOD LEFT HAND  Final   Special Requests   Final    BOTTLES DRAWN AEROBIC AND ANAEROBIC Blood Culture adequate volume   Culture   Final    NO GROWTH 2 DAYS Performed at [facility], [address] [place],  [address]    Report Status PENDING  Incomplete     Radiology Studies: CT HEAD WO CONTRAST  Result Date: 04/30/2020 CLINICAL DATA:  Un witnessed fall today EXAM: CT HEAD WITHOUT CONTRAST TECHNIQUE: Contiguous axial images were obtained from the base of the skull through the vertex without intravenous contrast. COMPARISON:  04/16/2020 FINDINGS: Brain: No acute infarct or hemorrhage. Lateral ventricles and midline structures are unremarkable. No acute extra-axial fluid collections. No mass effect. Vascular: No hyperdense vessel or unexpected calcification. Skull: Normal. Negative for fracture or focal lesion. Sinuses/Orbits: No acute finding. Other: None. IMPRESSION: 1. Stable head CT, no acute process. Electronically Signed   By: Randa Ngo M.D.   On: 04/30/2020 00:41    Scheduled Meds: . aspirin EC  81 mg Oral Daily  . carvedilol  3.125 mg Oral BID WC  . Chlorhexidine Gluconate Cloth  6 each Topical Daily  . enoxaparin (LOVENOX) injection  40 mg Subcutaneous Q24H  . insulin aspart  0-15 Units Subcutaneous Q4H  . melatonin  5 mg Oral QHS  . multivitamin-lutein  1 capsule Oral Daily  . Ensure Max Protein  11 oz Oral BID  . QUEtiapine  25 mg Oral QHS  .  simvastatin  40 mg Oral QHS  . sodium chloride flush  3 mL Intravenous Q12H  . spironolactone  25 mg Oral Daily  . tamsulosin  0.4 mg Oral QHS  . torsemide  20 mg Oral Daily   Continuous Infusions: . sodium chloride    . cefTRIAXone (ROCEPHIN)  IV 1 g (04/29/20 1823)     LOS: 7 days   Time spent:40  Minutes.  Arnetha Courser, MD [facility]  If 7PM-7AM, please contact night-coverage [email or web address]  04/30/2020, 1:32 PM   This record has been created using Conservation officer, historic buildings. Errors have been sought and corrected,but may not always be located. Such creation errors do not reflect on the standard of care.

## 2020-04-30 NOTE — Discharge Summary (Signed)
Physician Discharge Summary  Trevor Rodriguez. UKR:[id number] DOB: [birth year] DOA: 04/23/2020  PCP: Marletta Lor, NP  Admit date: 04/23/2020 Discharge date: 05/01/2020  Admitted From: Home  Disposition:  Home with Hospice  Recommendations for Outpatient Follow-up:  1. Follow up with PCP in 1-2 weeks 2. Follow-up in heart failure clinic 3. Please obtain BMP/CBC in one week 4. Please follow up on the following pending results:None  Home Health: Hospice care Equipment/Devices: Home oxygen Discharge Condition: Fair  CODE STATUS: Full Diet recommendation: Heart Healthy / Carb Modified    Brief/Interim Summary: Trevor Rodriguezis a 79 y.o.malewith medical history significant ofPatient is a 79 year old male with history of hypertension, hyperlipidemia,chronic systolic heart failure with EF of 25 to 30% from 06/2019 presents complaining of scrotal swellinglast few days.She was seen in the ED on 4/27 found with BNP of 2470. Scrotal ultrasound showed diffuse scrotal wall thickening with bilateral hydrocele likely due to CHF no evidence of torsion no evidence of infection. He was given IV Lasix and discharged home. He was recommended to take Lasix 40 mg p.o. twice daily for the next 3 days.  Continue to have worsening scrotal edema despite taking Lasix as directed prompted hospital admission.  Trevor Rodriguez was on hospice care and DNR and revoked his status to full code and wants to participate in rehab.  Trevor Rodriguez lives alone and cannot take care of himself.  PT/OT recommending SNF placement. Trevor Rodriguez's functional status declined during hospitalization and he developed cardio renal syndrome, family decided to take him back home with hospice care with more focus on comfort.  Trevor Rodriguez diuresed well with improvement in his scrotal edema.  IV Lasix discontinued after having an increase in creatinine.  He was started on torsemide 40 mg daily which resulted in another small bump in creatinine and dose  decreased to 20 mg daily. Kidney functions stable at 20 mg daily dose and he will continue with that. Trevor Rodriguez will need a close follow-up in heart failure clinic for further management of his volume status.  Trevor Rodriguez has an EF of 25 to 30%. WE discontinue home dose of coreg,and  entresto due to persistent softer BP, can be restarted at heart failure clinic if BP improves.  Trevor Rodriguez came with Trevor catheter, which was inserted on 04/17/20.  Trevor Rodriguez developed mild hematuria and would like to take this Trevor out.  Trevor was discontinued and he was able to void without any difficulty.  Urine culture grew Klebsiella and he was initially treated with ceftriaxone, then switch to keflex according to sensitivity results.He was given prescription for another 3 days to complete a 7 day course.  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  He will need a repeat testing in 4 weeks.  Trevor Rodriguez has well-controlled diabetes with A1c of 5.9 and will continue home dose of Metformin.  Discharge Diagnoses:  Active Problems:   CHF (congestive heart failure) (HCC)   AKI (acute kidney injury) (HCC)   Testicle swelling   Hypoxia  Discharge Instructions  Discharge Instructions    Amb Referral to Cardiac Rehabilitation   Complete by: As directed    Diagnosis: Heart Failure (see criteria below if ordering Phase II)   Heart Failure Type: Chronic Systolic   After initial evaluation and assessments completed: Virtual Based Care may be provided alone or in conjunction with Phase 2 Cardiac Rehab based on Trevor Rodriguez barriers.: Yes   Amb Referral to HF Clinic   Complete by: As directed  Diet - low sodium heart healthy   Complete by: As directed    Increase activity slowly   Complete by: As directed    Increase activity slowly   Complete by: As directed      Allergies as of 05/01/2020      Reactions   Ibuprofen Nausea Only      Medication List    STOP taking these  medications   Entresto 24-26 MG Generic drug: sacubitril-valsartan   furosemide 40 MG tablet Commonly known as: LASIX   losartan 25 MG tablet Commonly known as: COZAAR     TAKE these medications   aspirin 81 MG EC tablet Take 81 mg by mouth daily.   carvedilol 3.125 MG tablet Commonly known as: COREG TAKE 1 TABLET (3.125 MG TOTAL) BY MOUTH 2 (TWO) TIMES DAILY WITH A MEAL.   cephALEXin 250 MG capsule Commonly known as: KEFLEX Take 1 capsule (250 mg total) by mouth every 6 (six) hours for 3 days.   colchicine 0.6 MG tablet Take 0.6 mg by mouth daily.   melatonin 5 MG Tabs Take 1 tablet (5 mg total) by mouth at bedtime.   metFORMIN 500 MG tablet Commonly known as: Glucophage Take 1 tablet (500 mg total) by mouth 2 (two) times daily with a meal.   morphine CONCENTRATE 10 mg / 0.5 ml concentrated solution Take 0.25 mLs by mouth every 4 (four) hours as needed for chest pain.   multivitamin-lutein Caps capsule Take 1 capsule by mouth daily.   OXYGEN Inhale 2-4 L into the lungs as needed.   QUEtiapine 25 MG tablet Commonly known as: SEROQUEL Take 1 tablet (25 mg total) by mouth at bedtime.   simvastatin 40 MG tablet Commonly known as: ZOCOR Take 1 tablet (40 mg total) by mouth at bedtime.   spironolactone 25 MG tablet Commonly known as: ALDACTONE Take 1 tablet (25 mg total) by mouth daily.   tamsulosin 0.4 MG Caps capsule Commonly known as: FLOMAX Take 0.4 mg by mouth at bedtime.   torsemide 20 MG tablet Commonly known as: DEMADEX Take 1 tablet (20 mg total) by mouth daily. Start taking on: May 02, 2020       Allergies  Allergen Reactions  . Ibuprofen Nausea Only    Consultations:  None  Procedures/Studies: DG Chest 1 View  Result Date: 04/27/2020 CLINICAL DATA:  Hypoxia. EXAM: CHEST  1 VIEW COMPARISON:  04/17/2020. FINDINGS: Severe cardiomegaly again noted. No prominent pulmonary venous congestion. Persistent left base atelectasis/infiltrate and  small left pleural effusion. Slight improvement in aeration from prior exam. No pneumothorax. Degenerative change thoracic spine. IMPRESSION: 1. Severe cardiomegaly again noted. No prominent pulmonary venous congestion. 2. Persistent left base atelectasis/infiltrate and small left pleural effusion. Slight improvement in aeration from prior exam Electronically Signed   By: Maisie Fus  Register   On: 04/27/2020 07:42   CT HEAD WO CONTRAST  Result Date: 04/30/2020 CLINICAL DATA:  Un witnessed fall today EXAM: CT HEAD WITHOUT CONTRAST TECHNIQUE: Contiguous axial images were obtained from the base of the skull through the vertex without intravenous contrast. COMPARISON:  04/16/2020 FINDINGS: Brain: No acute infarct or hemorrhage. Lateral ventricles and midline structures are unremarkable. No acute extra-axial fluid collections. No mass effect. Vascular: No hyperdense vessel or unexpected calcification. Skull: Normal. Negative for fracture or focal lesion. Sinuses/Orbits: No acute finding. Other: None. IMPRESSION: 1. Stable head CT, no acute process. Electronically Signed   By: Sharlet Salina M.D.   On: 04/30/2020 00:41   CT Head  Wo Contrast  Result Date: 04/16/2020 CLINICAL DATA:  Syncope and subsequent fall. EXAM: CT HEAD WITHOUT CONTRAST TECHNIQUE: Contiguous axial images were obtained from the base of the skull through the vertex without intravenous contrast. COMPARISON:  None. FINDINGS: Brain: There is mild cerebral atrophy with widening of the extra-axial spaces and ventricular dilatation. There are areas of decreased attenuation within the white matter tracts of the supratentorial brain, consistent with microvascular disease changes. Vascular: No hyperdense vessel or unexpected calcification. Skull: Normal. Negative for fracture or focal lesion. Sinuses/Orbits: No acute finding. Other: None. IMPRESSION: 1. No acute intracranial abnormality. 2. Mild cerebral atrophy and microvascular disease changes of the  supratentorial brain. Electronically Signed   By: Aram Candela M.D.   On: 04/16/2020 00:21   CT ABDOMEN PELVIS W CONTRAST  Result Date: 04/17/2020 CLINICAL DATA:  Scrotal swelling for several days EXAM: CT ABDOMEN AND PELVIS WITH CONTRAST TECHNIQUE: Multidetector CT imaging of the abdomen and pelvis was performed using the standard protocol following bolus administration of intravenous contrast. CONTRAST:  OMNIPAQUE IOHEXOL 300 MG/ML  SOLN COMPARISON:  Ultrasound from the previous day, 02/20/2013. FINDINGS: Lower chest: Right lung base is clear. Small left pleural effusion is noted with mild associated atelectasis. Heart is enlarged in size. Hepatobiliary: Gallbladder is well distended. The liver is within normal limits. Some reflux of contrast into the hepatic veins is noted consistent with elevated central venous pressure. Pancreas: Unremarkable. No pancreatic ductal dilatation or surrounding inflammatory changes. Spleen: Normal in size without focal abnormality. Adrenals/Urinary Tract: Adrenal glands appear within normal limits. Kidneys demonstrate bilateral renal cystic change worse on the left than the right stable in appearance from previous exams. Largest of the cyst measures 6.1 cm. No renal calculi or obstructive changes are noted. Delayed images demonstrate normal excretion of contrast material. The bladder is decompressed. Stomach/Bowel: No obstructive or inflammatory changes of the bowel are identified. The appendix is within normal limits without inflammatory change. No small bowel abnormality is noted. Stomach is decompressed. Vascular/Lymphatic: Aortic atherosclerosis. No enlarged abdominal or pelvic lymph nodes. Reproductive: Prostate is unremarkable. There is diffuse scrotal edema identified as well as bilateral hydrocele similar to that seen on the prior ultrasound examination. No subcutaneous air to suggest localized gangrene is identified at this time. Other: Changes of anasarca  and mild ascites are seen these changes are all likely related to volume overload and mild congestive failure. Musculoskeletal: Diffuse anterior flowing osteophytes are noted throughout the lower thoracic and lumbar spine. No acute bony abnormality is noted. IMPRESSION: Scrotal swelling with edema and bilateral hydrocele similar to that seen on recent ultrasound examination. The hydroceles are stable from a prior CT from 2014. Diffuse mild ascites and changes of anasarca are noted likely related to volume overload/CHF. No subcutaneous air is noted to suggest Fournier's gangrene. Mild left basilar atelectasis with left-sided pleural effusion. 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 Multiple renal cysts. Electronically Signed   By: Alcide Clever M.D.   On: 04/17/2020 17:58   DG Chest Portable 1 View  Result Date: 04/17/2020 CLINICAL DATA:  Shortness of breath. EXAM: PORTABLE CHEST 1 VIEW COMPARISON:  June 26, 2019. FINDINGS: Stable cardiomegaly. No pneumothorax is noted. Right lung is clear. Interval development of mild left basilar atelectasis or infiltrate is noted with associated small pleural effusion. Bony thorax is unremarkable. IMPRESSION: Interval development of mild left basilar atelectasis or infiltrate is noted with associated small pleural effusion. Electronically Signed   By: Zenda Alpers.D.  On: 04/17/2020 16:55   US SCROTUM W/DOPPLER  Result Date: 04/16/2020 CLINICAL DATA:  Initial evaluation for acute scrotal swelling. EXAM: SCROTAL ULTRASOUND DOPPLER ULTRASOUND OF THE TESTICLES TECHNIQUE: Complete ultrasound examination of the testicles, epididymis, and other scrotal structures was performed. Color and spectral Doppler ultrasound were also utilized to evaluate blood flow to the testicles. COMPARISON:  None. FINDINGS: Right testicle Measurements: 3.5 x 2.7 x 2.6 cm. Diffuse prominence of the rete testes noted. Few small intra testicular cysts measuring up to 9 mm noted, likely  small spermatoceles. No solid intra testicular tumor. No microlithiasis. Left testicle Measurements: 3.4 x 2.5 x 2.4 cm. No mass or microlithiasis visualized. Right epididymis: Normal in size and appearance. 9 mm benign cyst/spermatocele present at the right epididymal head. Left epididymis: Normal in size and appearance. 1.3 cm mildly complex cystic lesion at the left epididymal head, most consistent with a benign epididymal cyst/spermatocele. Hydrocele:  Large right hydrocele, with small left-sided hydrocele. Varicocele:  None visualized. Pulsed Doppler interrogation of both testes demonstrates normal low resistance arterial and venous waveforms bilaterally. Diffuse edema and thickening seen about the scrotal wall. Small scrotal pearl noted on the right. IMPRESSION: 1. Diffuse scrotal wall thickening with large right and small left hydroceles, likely related to overall volume status/CHF. 2. Few small intra testicular cysts measuring up to 9 mm within the right testis, likely benign spermatoceles. No concerning intra testicular tumor. 3. Small bilateral epididymal head cysts/spermatoceles measuring up to 1.3 cm as above. 4. No evidence for torsion. Electronically Signed   By: Rise Mu M.D.   On: 04/16/2020 00:24    Subjective: Trevor Rodriguez as feeling tired when seen today.Appears lethargic and no new complaints.  Discharge Exam: Vitals:   05/01/20 0520 05/01/20 0809  BP: 97/75 108/75  Pulse: 79 72  Resp: 20   Temp: (!) 97.4 F (36.3 C)   SpO2: 100%    Vitals:   04/30/20 1837 04/30/20 2112 05/01/20 0520 05/01/20 0809  BP:  93/73 97/75 108/75  Pulse: 82 79 79 72  Resp:  16 20   Temp: 98 F (36.7 C) (!) 97.5 F (36.4 C) (!) 97.4 F (36.3 C)   TempSrc:  Oral Oral   SpO2: 100% 100% 100%   Weight:      Height:        General: Pt is alert, awake, not in acute distress. Appears lethargic. Cardiovascular: RRR, S1/S2 +, no rubs, no gallops Respiratory: CTA bilaterally, no wheezing, no  rhonchi Abdominal: Soft, NT, ND, bowel sounds + Extremities: 2+LE edema, no cyanosis   The results of significant diagnostics from this hospitalization (including imaging, microbiology, ancillary and laboratory) are listed below for reference.    Microbiology: Recent Results (from the past 240 hour(s))  Respiratory Panel by RT PCR (Flu A&B, Covid) - Nasopharyngeal Swab     Status: None   Collection Time: 04/23/20  1:14 PM   Specimen: Nasopharyngeal Swab  Result Value Ref Range Status   SARS Coronavirus 2 by RT PCR NEGATIVE NEGATIVE Final    Comment: (NOTE) SARS-CoV-2 target nucleic acids are NOT DETECTED. The SARS-CoV-2 RNA is generally detectable in upper respiratoy specimens during the acute phase of infection. The lowest concentration of SARS-CoV-2 viral copies this assay can detect is 131 copies/mL. A negative result does not preclude SARS-Cov-2 infection and should not be used as the sole basis for treatment or other Trevor Rodriguez management decisions. A negative result may occur with  improper specimen collection/handling, submission of specimen other than nasopharyngeal  swab, presence of viral mutation(s) within the areas targeted by this assay, and inadequate number of viral copies (<131 copies/mL). A negative result must be combined with clinical observations, Trevor Rodriguez history, and epidemiological information. The expected result is Negative. Fact Sheet for Patients:  PinkCheek.be Fact Sheet for Healthcare Providers:  GravelBags.it This test is not yet ap proved or cleared by the [place] FDA and  has been authorized for detection and/or diagnosis of SARS-CoV-2 by FDA under an Emergency Use Authorization (EUA). This EUA will remain  in effect (meaning this test can be used) for the duration of the COVID-19 declaration under Section 564(b)(1) of the Act, 21 U.S.C. section 360bbb-3(b)(1), unless the authorization is  terminated or revoked sooner.    Influenza A by PCR NEGATIVE NEGATIVE Final   Influenza B by PCR NEGATIVE NEGATIVE Final    Comment: (NOTE) The Xpert Xpress SARS-CoV-2/FLU/RSV assay is intended as an aid in  the diagnosis of influenza from Nasopharyngeal swab specimens and  should not be used as a sole basis for treatment. Nasal washings and  aspirates are unacceptable for Xpert Xpress SARS-CoV-2/FLU/RSV  testing. Fact Sheet for Patients: PinkCheek.be Fact Sheet for Healthcare Providers: GravelBags.it This test is not yet approved or cleared by the [place] FDA and  has been authorized for detection and/or diagnosis of SARS-CoV-2 by  FDA under an Emergency Use Authorization (EUA). This EUA will remain  in effect (meaning this test can be used) for the duration of the  Covid-19 declaration under Section 564(b)(1) of the Act, 21  U.S.C. section 360bbb-3(b)(1), unless the authorization is  terminated or revoked. Performed at [facility], [address] [place], [place] [address]   Urine Culture     Status: Abnormal   Collection Time: 04/27/20  9:21 AM   Specimen: Urine, Random  Result Value Ref Range Status   Specimen Description   Final    URINE, RANDOM Performed at [facility], [address] [place], [place] [address]    Special Requests   Final    NONE Performed at [facility], [address] [place], [place] [address]    Culture >=100,000 COLONIES/mL KLEBSIELLA ORNITHINOLYTICA (A)  Final   Report Status 04/30/2020 FINAL  Final   Organism ID, Bacteria KLEBSIELLA ORNITHINOLYTICA (A)  Final      Susceptibility   Klebsiella ornithinolytica - MIC*    AMPICILLIN >=32 RESISTANT Resistant     CEFAZOLIN <=4 SENSITIVE Sensitive     CEFTRIAXONE <=1 SENSITIVE Sensitive     CIPROFLOXACIN <=0.25 SENSITIVE Sensitive     GENTAMICIN <=1 SENSITIVE Sensitive     IMIPENEM <=0.25 SENSITIVE  Sensitive     NITROFURANTOIN <=16 SENSITIVE Sensitive     TRIMETH/SULFA <=20 SENSITIVE Sensitive     AMPICILLIN/SULBACTAM 8 SENSITIVE Sensitive     PIP/TAZO 8 SENSITIVE Sensitive     * >=100,000 COLONIES/mL KLEBSIELLA ORNITHINOLYTICA  CULTURE, BLOOD (ROUTINE X 2) w Reflex to ID Panel     Status: None (Preliminary result)   Collection Time: 04/28/20 10:40 AM   Specimen: BLOOD  Result Value Ref Range Status   Specimen Description BLOOD LEFT ANTECUBITAL  Final   Special Requests   Final    BOTTLES DRAWN AEROBIC AND ANAEROBIC Blood Culture adequate volume   Culture   Final    NO GROWTH 3 DAYS Performed at [facility], [address] [place], [place] [address]    Report Status PENDING  Incomplete  CULTURE, BLOOD (ROUTINE X 2) w  Reflex to ID Panel     Status: None (Preliminary result)   Collection Time: 04/28/20 10:40 AM   Specimen: BLOOD  Result Value Ref Range Status   Specimen Description BLOOD BLOOD LEFT HAND  Final   Special Requests   Final    BOTTLES DRAWN AEROBIC AND ANAEROBIC Blood Culture adequate volume   Culture   Final    NO GROWTH 3 DAYS Performed at [facility], [address] [place], [place] [address]    Report Status PENDING  Incomplete     Labs: BNP (last 3 results) Recent Labs    11/28/19 1155 04/15/20 2356 04/23/20 1206  BNP 295.4* 2,470.8* 2,714.0*   Basic Metabolic Panel: Recent Labs  Lab 04/27/20 0701 04/28/20 0553 04/29/20 0616 04/30/20 0653 05/01/20 0541  NA 136 134* 136 136 135  K 4.1 4.6 4.8 5.1 4.9  CL 100 97* 98 99 96*  CO2 26 25 26  21* 28  GLUCOSE 95 93 92 89 94  BUN 41* 45* 53* 55* 54*  CREATININE 1.55* 1.60* 1.61* 1.80* 1.55*  CALCIUM 8.7* 8.8* 9.2 9.0 9.4  PHOS  --   --  4.6  --   --    Liver Function Tests: Recent Labs  Lab 04/27/20 0701 04/29/20 0616  AST 25  --   ALT 45*  --   ALKPHOS 51  --   BILITOT 1.1  --   PROT 6.4*  --   ALBUMIN 3.6 3.7   No results for input(s): LIPASE, AMYLASE in the  last 168 hours. No results for input(s): AMMONIA in the last 168 hours. CBC: Recent Labs  Lab 04/25/20 0611 04/26/20 0524 04/28/20 0715 04/29/20 0616 04/30/20 0653  WBC 4.2 4.1 5.4 4.8 6.0  HGB 12.9* 13.3 13.7 12.4* 12.0*  HCT 38.0* 40.1 42.9 37.0* 35.3*  MCV 96.2 97.8 102.6* 96.9 96.7  PLT 172 169 152 145* 118*   Cardiac Enzymes: No results for input(s): CKTOTAL, CKMB, CKMBINDEX, TROPONINI in the last 168 hours. BNP: Invalid input(s): POCBNP CBG: Recent Labs  Lab 04/30/20 1126 04/30/20 1647 04/30/20 1747 04/30/20 2122 05/01/20 0746  GLUCAP 111* 68* 91 71 83   D-Dimer No results for input(s): DDIMER in the last 72 hours. Hgb A1c No results for input(s): HGBA1C in the last 72 hours. Lipid Profile No results for input(s): CHOL, HDL, LDLCALC, TRIG, CHOLHDL, LDLDIRECT in the last 72 hours. Thyroid function studies No results for input(s): TSH, T4TOTAL, T3FREE, THYROIDAB in the last 72 hours.  Invalid input(s): FREET3 Anemia work up No results for input(s): VITAMINB12, FOLATE, FERRITIN, TIBC, IRON, RETICCTPCT in the last 72 hours. Urinalysis    Component Value Date/Time   COLORURINE AMBER (A) 04/27/2020 0921   APPEARANCEUR CLOUDY (A) 04/27/2020 0921   LABSPEC 1.019 04/27/2020 0921   PHURINE 5.0 04/27/2020 0921   GLUCOSEU NEGATIVE 04/27/2020 0921   HGBUR LARGE (A) 04/27/2020 0921   BILIRUBINUR NEGATIVE 04/27/2020 0921   KETONESUR NEGATIVE 04/27/2020 0921   PROTEINUR 100 (A) 04/27/2020 0921   UROBILINOGEN 1.0 07/11/2010 1052   NITRITE NEGATIVE 04/27/2020 0921   LEUKOCYTESUR MODERATE (A) 04/27/2020 0921   Sepsis Labs Invalid input(s): PROCALCITONIN,  WBC,  LACTICIDVEN Microbiology Recent Results (from the past 240 hour(s))  Respiratory Panel by RT PCR (Flu A&B, Covid) - Nasopharyngeal Swab     Status: None   Collection Time: 04/23/20  1:14 PM   Specimen: Nasopharyngeal Swab  Result Value Ref Range Status   SARS Coronavirus 2 by RT PCR NEGATIVE NEGATIVE  Final     Comment: (NOTE) SARS-CoV-2 target nucleic acids are NOT DETECTED. The SARS-CoV-2 RNA is generally detectable in upper respiratoy specimens during the acute phase of infection. The lowest concentration of SARS-CoV-2 viral copies this assay can detect is 131 copies/mL. A negative result does not preclude SARS-Cov-2 infection and should not be used as the sole basis for treatment or other Trevor Rodriguez management decisions. A negative result may occur with  improper specimen collection/handling, submission of specimen other than nasopharyngeal swab, presence of viral mutation(s) within the areas targeted by this assay, and inadequate number of viral copies (<131 copies/mL). A negative result must be combined with clinical observations, Trevor Rodriguez history, and epidemiological information. The expected result is Negative. Fact Sheet for Patients:  https://www.moore.com/ Fact Sheet for Healthcare Providers:  https://www.young.biz/ This test is not yet ap proved or cleared by the [place] FDA and  has been authorized for detection and/or diagnosis of SARS-CoV-2 by FDA under an Emergency Use Authorization (EUA). This EUA will remain  in effect (meaning this test can be used) for the duration of the COVID-19 declaration under Section 564(b)(1) of the Act, 21 U.S.C. section 360bbb-3(b)(1), unless the authorization is terminated or revoked sooner.    Influenza A by PCR NEGATIVE NEGATIVE Final   Influenza B by PCR NEGATIVE NEGATIVE Final    Comment: (NOTE) The Xpert Xpress SARS-CoV-2/FLU/RSV assay is intended as an aid in  the diagnosis of influenza from Nasopharyngeal swab specimens and  should not be used as a sole basis for treatment. Nasal washings and  aspirates are unacceptable for Xpert Xpress SARS-CoV-2/FLU/RSV  testing. Fact Sheet for Patients: https://www.moore.com/ Fact Sheet for Healthcare  Providers: https://www.young.biz/ This test is not yet approved or cleared by the [place] FDA and  has been authorized for detection and/or diagnosis of SARS-CoV-2 by  FDA under an Emergency Use Authorization (EUA). This EUA will remain  in effect (meaning this test can be used) for the duration of the  Covid-19 declaration under Section 564(b)(1) of the Act, 21  U.S.C. section 360bbb-3(b)(1), unless the authorization is  terminated or revoked. Performed at [facility], [address] [place], [place] [address]   Urine Culture     Status: Abnormal   Collection Time: 04/27/20  9:21 AM   Specimen: Urine, Random  Result Value Ref Range Status   Specimen Description   Final    URINE, RANDOM Performed at [facility], [address] [place], [place] [address]    Special Requests   Final    NONE Performed at [facility], [address] [place], [place] [address]    Culture >=100,000 COLONIES/mL KLEBSIELLA ORNITHINOLYTICA (A)  Final   Report Status 04/30/2020 FINAL  Final   Organism ID, Bacteria KLEBSIELLA ORNITHINOLYTICA (A)  Final      Susceptibility   Klebsiella ornithinolytica - MIC*    AMPICILLIN >=32 RESISTANT Resistant     CEFAZOLIN <=4 SENSITIVE Sensitive     CEFTRIAXONE <=1 SENSITIVE Sensitive     CIPROFLOXACIN <=0.25 SENSITIVE Sensitive     GENTAMICIN <=1 SENSITIVE Sensitive     IMIPENEM <=0.25 SENSITIVE Sensitive     NITROFURANTOIN <=16 SENSITIVE Sensitive     TRIMETH/SULFA <=20 SENSITIVE Sensitive     AMPICILLIN/SULBACTAM 8 SENSITIVE Sensitive     PIP/TAZO 8 SENSITIVE Sensitive     * >=100,000 COLONIES/mL KLEBSIELLA ORNITHINOLYTICA  CULTURE, BLOOD (ROUTINE X 2) w Reflex to ID Panel     Status: None (Preliminary result)  Collection Time: 04/28/20 10:40 AM   Specimen: BLOOD  Result Value Ref Range Status   Specimen Description BLOOD LEFT ANTECUBITAL  Final   Special Requests   Final    BOTTLES DRAWN AEROBIC  AND ANAEROBIC Blood Culture adequate volume   Culture   Final    NO GROWTH 3 DAYS Performed at [facility], [address] [place], [place] [address]    Report Status PENDING  Incomplete  CULTURE, BLOOD (ROUTINE X 2) w Reflex to ID Panel     Status: None (Preliminary result)   Collection Time: 04/28/20 10:40 AM   Specimen: BLOOD  Result Value Ref Range Status   Specimen Description BLOOD BLOOD LEFT HAND  Final   Special Requests   Final    BOTTLES DRAWN AEROBIC AND ANAEROBIC Blood Culture adequate volume   Culture   Final    NO GROWTH 3 DAYS Performed at [facility], [address], [place], [place] [address]    Report Status PENDING  Incomplete    Time coordinating discharge: Over 30 minutes  SIGNED:  Arnetha Courser, MD  [facility] 05/01/2020, 9:47 AM  If 7PM-7AM, please contact night-coverage [email or web address]  This record has been created using Conservation officer, historic buildings. Errors have been sought and corrected,but may not always be located. Such creation errors do not reflect on the standard of care.

## 2020-04-30 NOTE — Progress Notes (Addendum)
[facility] Room 235 [facility]) hospital liaison RN note for hospitalized hospice patient  Mr. 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 Dan Humphreys with [facility]. Patient brought to [facility] on 5.3.21 for complaints of testicular swelling and worsening BLE swelling. Hospice was notified and home care RN reports patient has been taking his Lasix as prescribed. Mr. Tugwell is admitted with CHF. This is a related hospital admission.  Mr. Zurn has had some altered mental status over the weekend. It is reported he was trying to break out the window in his room with a walker and last night experienced a fall trying to get back in the bed from the recliner.  This morning, Mr. Kaigler is very sleepy, difficult to arouse. His breakfast remains largely uneaten, just a few bites. Legs with pitting edema.  Phone call with daughter, Meriam Sprague, to discuss changes observed with her father. She is going to reach out to her other two siblings to see if we can meet or have a conference call to discuss next steps. Dr. Nelson Chimes updated.  V/S:T 97.9, BP 113/81, HR 75, RR 16, Sats 99% on 5 lpm HFNC.  Pertinent abnormal lab work: Results for TAIMUR, FIER (MRN [id number]) as of 04/30/2020 10:35  Ref. Range 04/30/2020 06:53  BASIC METABOLIC PANEL Unknown Rpt (A)  CO2 Latest Ref Range: 22 - 32 mmol/L 21 (L)  BUN Latest Ref Range: 8 - 23 mg/dL 55 (H)  Creatinine Latest Ref Range: 0.61 - 1.24 mg/dL 9.92 (H)  Anion gap Latest Ref Range: 5 - 15  16 (H)  GFR, Est Non African American Latest Ref Range: >60 mL/min 35 (L)  GFR, Est African American Latest Ref Range: >60 mL/min 41 (L)  RBC Latest Ref Range: 4.22 - 5.81 MIL/uL 3.65 (L)  Hemoglobin Latest Ref Range: 13.0 - 17.0 g/dL 42.6 (L)  HCT Latest Ref Range: 39.0 - 52.0 % 35.3 (L)  RDW Latest Ref Range: 11.5 - 15.5 % 16.9 (H)  Platelets Latest Ref Range: 150 - 400 K/uL 118  (L)  nRBC Latest Ref Range: 0.0 - 0.2 % 0.3 (H)   Diagnostics:  CT Head IMPRESSION: 1. Stable head CT, no acute process.  IV's/PRN's: Rocephin 1G IV daily, Tylenol 650 mg po x 1 for pain, guaifenesin 100-10 mg/5 ml solution 10 ml prn cough x 1.  I/O: 240/475  Assessment/Plan:  Pt does have increased swelling but creatinine continues to rise. PO torsemide is being held as well as Lasix. Pt is now requiring increased O2. He is more somnolent and eating only bites and sips. Discussed with Dr. Nelson Chimes who states pt is ready for discharge as medical therapy has been maximized. Awaiting phone call back from daughter to determine next steps. Meriam Sprague expressed that she is feeling they likely need to take him home for end of life care. Will continue to follow.  Discharge Planning: To be determined after consultation with family.  Family contact: spoke with daughter Meriam Sprague by phone. Awaiting call back.  1200 ADDENDUM: Family conference call with Meriam Sprague (daughter), sons Rod and Loraine Leriche and grandson Earna Coder. Explained decline of patients health status this past week. Answered all questions. Family has decided they would like to bring patient home for EOL comfort care. They will provide 24 hour care around the clock. No further plans for hospitalization. Code status remains full at this time, family still processing. Family requests discharge tomorrow to make arrangements in the home. Dr.  Amin agreeable.  IDT: updated Goals of Care: Discussed code status with [name], she wishes for other sibling input before changing.  Please call with any hospice related questions or concerns.  Thank you. Margaretmary Eddy, BSN, RN [facility] Liaison [phone]

## 2020-04-30 NOTE — Progress Notes (Signed)
Hypoglycemic Event  CBG: 68  Treatment:1 juice  Symptoms: none  Follow-up CBG: Time:1747 CBG Result:91  Possible Reasons for Event: unsure  Comments/MD notified:hypoglycemia protocol followed    Trevor Rodriguez

## 2020-04-30 NOTE — Plan of Care (Signed)
  Problem: Health Behavior/Discharge Planning: Goal: Ability to manage health-related needs will improve Outcome: Not Progressing   

## 2020-04-30 NOTE — Progress Notes (Signed)
Patient arrived from 2A.

## 2020-04-30 NOTE — Progress Notes (Signed)
Nutrition Follow Up Note   DOCUMENTATION CODES:   Obesity unspecified  INTERVENTION:   Discontinue Ensure Max protein as pt is refusing   Ocuvite daily for wound healing (provides zinc, vitamin A, vitamin C, Vitamin E, copper, and selenium)  NUTRITION DIAGNOSIS:   Increased nutrient needs related to wound healing as evidenced by increased estimated needs.  GOAL:   Patient will meet greater than or equal to 90% of their needs -not met  MONITOR:   PO intake, Supplement acceptance, Labs, Weight trends, Skin, I & O's  ASSESSMENT:   79 year old male with history of hypertension, hyperlipidemia, chronic systolic heart failure with EF of 25 to 30% (followed by hospice) who is admitted with scrotal swelling   Pt increasingly lethargic; oral intake decreased. Pt is refusing Ensure supplements. Renal function worsening. Family meeting today; family leaning towards comfort measures.   Per chart, pt is weight stable since admit.   Medications reviewed and include: aspirin, cephalexin, lovenox, insulin, melatonin, ocuvite, aldactone, torsemide  Labs reviewed: K 5.1 wnl, BUN 55(H), creat 1.80(H), P 4.6 wnl  Diet Order:   Diet Order            Diet heart healthy/carb modified Room service appropriate? Yes; Fluid consistency: Thin  Diet effective now             EDUCATION NEEDS:   Not appropriate for education at this time  Skin:  Skin Assessment: Reviewed RN Assessment(Left outer calf 3X3X.1cm, Left inner calf 7X7X.1cm, Right inner calf 2X2X.1cm)  Last BM:  5/7- type 3  Height:   Ht Readings from Last 1 Encounters:  04/23/20 '5\' 6"'$  (1.676 m)    Weight:   Wt Readings from Last 1 Encounters:  04/30/20 102 kg    Ideal Body Weight:  64.5 kg  BMI:  Body mass index is 36.28 kg/m.  Estimated Nutritional Needs:   Kcal:  2000-2300kcal/day  Protein:  100-115g/day  Fluid:  >1.6L/day  Koleen Distance MS, RD, LDN Please refer to [facility] for RD and/or RD  on-call/weekend/after hours pager

## 2020-04-30 NOTE — NC FL2 (Signed)
[place] MEDICAID FL2 LEVEL OF CARE SCREENING TOOL     IDENTIFICATION  Patient Name: Trevor Rodriguez. Birthdate: [birth year] Sex: male Admission Date (Current Location): 04/23/2020  [place] and [place] Number:  Chiropodist and Address:  [facility], [address] [place], [place] [address]      Provider Number: [id number]  Attending Physician Name and Address:  Arnetha Courser, MD  Relative Name and Phone Number:       Current Level of Care: Hospital Recommended Level of Care: Skilled Nursing Facility Prior Approval Number:    Date Approved/Denied:   PASRR Number: [id number]  Discharge Plan: SNF    Current Diagnoses: Patient Active Problem List   Diagnosis Date Noted  . AKI (acute kidney injury) (HCC)   . Testicle swelling   . Acute systolic HF (heart failure) (HCC) 06/26/2019  . 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 Type II or unspecified type diabetes mellitus without mention of complication, not stated as uncontrolled 02/22/2013  . CHF (congestive heart failure) (HCC) 02/20/2013  . Hyperglycemia 02/20/2013  . Essential hypertension, benign 02/20/2013  . Other and unspecified hyperlipidemia 02/20/2013    Orientation RESPIRATION BLADDER Height & Weight     Self, Time, Situation, Place  Normal Incontinent Weight: 224 lb 12.9 oz (102 kg) Height:  5\' 6"  (167.6 cm)  BEHAVIORAL SYMPTOMS/MOOD NEUROLOGICAL BOWEL NUTRITION STATUS      Continent Diet(heart healthy/carb modified, thin liquids)  AMBULATORY STATUS COMMUNICATION OF NEEDS Skin   Extensive Assist Verbally Other (Comment)(Diabetic ulcer on right leg, guaze dressing. Diabetic ulcer on left leg, guaze dressing.)                       Personal Care Assistance Level of Assistance  Bathing, Feeding, Dressing Bathing Assistance: Maximum assistance Feeding assistance: Independent Dressing Assistance: Maximum assistance      Functional Limitations Info  Hearing, Sight, Speech Sight Info: Adequate Hearing Info: Adequate Speech Info: Adequate    SPECIAL CARE FACTORS FREQUENCY  PT (By licensed PT), OT (By licensed OT)     PT Frequency: 5x OT Frequency: 5x            Contractures Contractures Info: Not present    Additional Factors Info  Code Status, Allergies Code Status Info: Full Code Allergies Info: Ibuprofen           Current Medications (04/30/2020):  This is the current hospital active medication list Current Facility-Administered Medications  Medication Dose Route Frequency Provider Last Rate Last Admin  . 0.9 %  sodium chloride infusion  250 mL Intravenous PRN 06/30/2020, MD      . acetaminophen (TYLENOL) tablet 650 mg  650 mg Oral Q4H PRN Lynn Ito, MD   650 mg at 04/30/20 0102  . aspirin EC tablet 81 mg  81 mg Oral Daily 06/30/20, MD   81 mg at 04/30/20 0836  . carvedilol (COREG) tablet 3.125 mg  3.125 mg Oral BID WC 06/30/20, MD   3.125 mg at 04/30/20 0836  . cefTRIAXone (ROCEPHIN) 1 g in sodium chloride 0.9 % 100 mL IVPB  1 g Intravenous q1800 06/30/20, MD 200 mL/hr at 04/29/20 1823 1 g at 04/29/20 1823  . Chlorhexidine Gluconate Cloth 2 % PADS 6 each  6 each Topical Daily 06/29/20, MD   6 each at 04/27/20 [phone]  . enoxaparin (LOVENOX) injection 40 mg  40 mg Subcutaneous Q24H  Nolberto Hanlon, MD   40 mg at 04/29/20 2007  . guaiFENesin-codeine 100-10 MG/5ML solution 10 mL  10 mL Oral Q4H PRN Sharion Settler, NP   10 mL at 04/30/20 0509  . insulin aspart (novoLOG) injection 0-15 Units  0-15 Units Subcutaneous Q4H Nolberto Hanlon, MD   2 Units at 04/25/20 2105  . ipratropium-albuterol (DUONEB) 0.5-2.5 (3) MG/3ML nebulizer solution 3 mL  3 mL Nebulization Q4H PRN Lorella Nimrod, MD   3 mL at 04/29/20 1251  . melatonin tablet 5 mg  5 mg Oral QHS Sharion Settler, NP   5 mg at 04/30/20 0229  . multivitamin-lutein (OCUVITE-LUTEIN) capsule 1 capsule  1 capsule Oral Daily Lorella Nimrod, MD   1 capsule at 04/30/20 0836  . ondansetron (ZOFRAN) injection 4 mg  4 mg Intravenous Q6H PRN Nolberto Hanlon, MD   4 mg at 04/28/20 1618  . protein supplement (ENSURE MAX) liquid  11 oz Oral BID Lorella Nimrod, MD   11 oz at 04/29/20 2333  . QUEtiapine (SEROQUEL) tablet 25 mg  25 mg Oral QHS Nolberto Hanlon, MD   25 mg at 04/29/20 2006  . simvastatin (ZOCOR) tablet 40 mg  40 mg Oral QHS Nolberto Hanlon, MD   40 mg at 04/29/20 2006  . sodium chloride flush (NS) 0.9 % injection 3 mL  3 mL Intravenous Q12H Nolberto Hanlon, MD   3 mL at 04/29/20 2333  . sodium chloride flush (NS) 0.9 % injection 3 mL  3 mL Intravenous PRN Nolberto Hanlon, MD   3 mL at 04/23/20 1441  . spironolactone (ALDACTONE) tablet 25 mg  25 mg Oral Daily Nolberto Hanlon, MD   25 mg at 04/30/20 0837  . tamsulosin (FLOMAX) capsule 0.4 mg  0.4 mg Oral QHS Nolberto Hanlon, MD   0.4 mg at 04/29/20 2006  . torsemide (DEMADEX) tablet 20 mg  20 mg Oral Daily Lorella Nimrod, MD   20 mg at 04/30/20 0814     Discharge Medications: Please see discharge summary for a list of discharge medications.  Relevant Imaging Results:  Relevant Lab Results:   Additional Information [phone]  Eileen Stanford, LCSW

## 2020-04-30 NOTE — Progress Notes (Signed)
BP 98/70. Patient is scheduled to get coreg. Dr Nelson Chimes said to hold this dose of coreg

## 2020-05-01 DIAGNOSIS — R0902 Hypoxemia: Secondary | ICD-10-CM

## 2020-05-01 LAB — BASIC METABOLIC PANEL
Anion gap: 11 (ref 5–15)
BUN: 54 mg/dL — ABNORMAL HIGH (ref 8–23)
CO2: 28 mmol/L (ref 22–32)
Calcium: 9.4 mg/dL (ref 8.9–10.3)
Chloride: 96 mmol/L — ABNORMAL LOW (ref 98–111)
Creatinine, Ser: 1.55 mg/dL — ABNORMAL HIGH (ref 0.61–1.24)
GFR calc Af Amer: 49 mL/min — ABNORMAL LOW (ref >60)
GFR calc non Af Amer: 42 mL/min — ABNORMAL LOW (ref >60)
Glucose, Bld: 94 mg/dL (ref 70–99)
Potassium: 4.9 mmol/L (ref 3.5–5.1)
Sodium: 135 mmol/L (ref 135–145)

## 2020-05-01 LAB — GLUCOSE, CAPILLARY: Glucose-Capillary: 83 mg/dL (ref 70–99)

## 2020-05-01 MED ORDER — TORSEMIDE 20 MG PO TABS: 20.0000 mg | ORAL_TABLET | Freq: Every day | ORAL | 1 refills | Status: AC

## 2020-05-01 MED ORDER — MELATONIN 5 MG PO TABS: 5.0000 mg | ORAL_TABLET | Freq: Every day | ORAL | 0 refills | Status: AC

## 2020-05-01 MED ORDER — CEPHALEXIN 250 MG PO CAPS: 250.0000 mg | ORAL_CAPSULE | Freq: Four times a day (QID) | ORAL | 0 refills | Status: AC

## 2020-05-01 MED ORDER — OCUVITE-LUTEIN PO CAPS: 1.0000 | ORAL_CAPSULE | Freq: Every day | ORAL | 0 refills | Status: AC

## 2020-05-01 MED ORDER — QUETIAPINE FUMARATE 25 MG PO TABS: 25.0000 mg | ORAL_TABLET | Freq: Every day | ORAL | Status: AC

## 2020-05-01 MED ORDER — SIMVASTATIN 40 MG PO TABS: 40.0000 mg | ORAL_TABLET | Freq: Every day | ORAL | Status: AC

## 2020-05-01 NOTE — Progress Notes (Signed)
Trevor Rodriguez. to be D/C'd home by EMS per MD order.  Discussed prescriptions and follow up appointments with the patient. Prescriptions given to patient, medication list explained in detail. Pt verbalized understanding.  Allergies as of 05/01/2020       Reactions   Ibuprofen Nausea Only        Medication List     STOP taking these medications    Entresto 24-26 MG Generic drug: sacubitril-valsartan   furosemide 40 MG tablet Commonly known as: LASIX   losartan 25 MG tablet Commonly known as: COZAAR       TAKE these medications    aspirin 81 MG EC tablet Take 81 mg by mouth daily.   carvedilol 3.125 MG tablet Commonly known as: COREG TAKE 1 TABLET (3.125 MG TOTAL) BY MOUTH 2 (TWO) TIMES DAILY WITH A MEAL.   cephALEXin 250 MG capsule Commonly known as: KEFLEX Take 1 capsule (250 mg total) by mouth every 6 (six) hours for 3 days.   colchicine 0.6 MG tablet Take 0.6 mg by mouth daily.   melatonin 5 MG Tabs Take 1 tablet (5 mg total) by mouth at bedtime.   metFORMIN 500 MG tablet Commonly known as: Glucophage Take 1 tablet (500 mg total) by mouth 2 (two) times daily with a meal.   morphine CONCENTRATE 10 mg / 0.5 ml concentrated solution Take 0.25 mLs by mouth every 4 (four) hours as needed for chest pain.   multivitamin-lutein Caps capsule Take 1 capsule by mouth daily.   OXYGEN Inhale 2-4 L into the lungs as needed.   QUEtiapine 25 MG tablet Commonly known as: SEROQUEL Take 1 tablet (25 mg total) by mouth at bedtime.   simvastatin 40 MG tablet Commonly known as: ZOCOR Take 1 tablet (40 mg total) by mouth at bedtime.   spironolactone 25 MG tablet Commonly known as: ALDACTONE Take 1 tablet (25 mg total) by mouth daily.   tamsulosin 0.4 MG Caps capsule Commonly known as: FLOMAX Take 0.4 mg by mouth at bedtime.   torsemide 20 MG tablet Commonly known as: DEMADEX Take 1 tablet (20 mg total) by mouth daily. Start taking on: May 02, 2020         Vitals:   05/01/20 0520 05/01/20 0809  BP: 97/75 108/75  Pulse: 79 72  Resp: 20   Temp: (!) 97.4 F (36.3 C)   SpO2: 100%     Skin clean, dry and intact without evidence of skin break down, no evidence of skin tears noted. IV catheter discontinued intact. Site without signs and symptoms of complications. Dressing and pressure applied. Pt denies pain at this time. No complaints noted.  An After Visit Summary was printed and given to the patient. Patient escorted via WC, and D/C home via private auto.  Bladimir Auman A Kyng Matlock

## 2020-05-01 NOTE — TOC Transition Note (Signed)
Transition of Care [facility]) - CM/SW Discharge Note   Patient Details  Name: Trevor Rodriguez. MRN: [id number] Date of Birth: 1941/11/18  [facility] CM/SW Contact:  Margarito Liner, LCSW Phone Number: 05/01/2020, 10:39 AM   Clinical Narrative: Patient has orders to discharge home with hospice today. Hospice liaison confirmed home address with patient's daughter: [address], [place], [place] [address]. EMS transport has been called and patient is on the list. Tried calling patient's daughter to notify but she had stepped out. Daughter-in-law will relay message. No further concerns. CSW signing off.    Final next level of care: Home w Hospice Care Barriers to Discharge: Barriers Resolved   Patient Goals and CMS Choice Patient states their goals for this hospitalization and ongoing recovery are:: Get placed somewhere for help      Discharge Placement                Patient to be transferred to facility by: EMS Name of family member notified: Kathrin Greathouse and her daughter-in-law Patient and family notified of of transfer: 05/01/20  Discharge Plan and Services                                     Social Determinants of Health (SDOH) Interventions     Readmission Risk Interventions No flowsheet data found.

## 2020-05-03 LAB — CULTURE, BLOOD (ROUTINE X 2)
Culture: NO GROWTH
Culture: NO GROWTH
Special Requests: ADEQUATE
Special Requests: ADEQUATE

## 2020-06-21 DEATH — deceased
